# Patient Record
Sex: Male | Born: 1950 | Race: Black or African American | Hispanic: No | Marital: Single | Smoking: Current every day smoker
Health system: Southern US, Community
[De-identification: ages and names within clinical notes are randomized; demographics above are authoritative.]

## PROBLEM LIST (undated history)

## (undated) DIAGNOSIS — M1990 Unspecified osteoarthritis, unspecified site: Secondary | ICD-10-CM

## (undated) DIAGNOSIS — R079 Chest pain, unspecified: Secondary | ICD-10-CM

## (undated) HISTORY — PX: JOINT REPLACEMENT: SHX530

---

## 2011-02-15 HISTORY — PX: ROTATOR CUFF REPAIR: SHX139

## 2014-07-23 DIAGNOSIS — F17200 Nicotine dependence, unspecified, uncomplicated: Secondary | ICD-10-CM | POA: Insufficient documentation

## 2014-07-23 DIAGNOSIS — E785 Hyperlipidemia, unspecified: Secondary | ICD-10-CM | POA: Insufficient documentation

## 2014-07-23 DIAGNOSIS — E119 Type 2 diabetes mellitus without complications: Secondary | ICD-10-CM

## 2014-07-23 HISTORY — DX: Type 2 diabetes mellitus without complications: E11.9

## 2014-09-24 ENCOUNTER — Other Ambulatory Visit: Payer: Self-pay | Admitting: Orthopedic Surgery

## 2014-09-24 DIAGNOSIS — M5441 Lumbago with sciatica, right side: Secondary | ICD-10-CM

## 2014-10-01 ENCOUNTER — Ambulatory Visit
Admission: RE | Admit: 2014-10-01 | Discharge: 2014-10-01 | Disposition: A | Discharge status: Home / Self Care | Payer: Medicare HMO | Source: Ambulatory Visit | Attending: Orthopedic Surgery | Admitting: Orthopedic Surgery

## 2014-10-01 DIAGNOSIS — M545 Low back pain: Secondary | ICD-10-CM | POA: Insufficient documentation

## 2014-10-01 DIAGNOSIS — M5126 Other intervertebral disc displacement, lumbar region: Secondary | ICD-10-CM | POA: Insufficient documentation

## 2014-10-01 DIAGNOSIS — M5441 Lumbago with sciatica, right side: Secondary | ICD-10-CM

## 2014-10-01 DIAGNOSIS — M5136 Other intervertebral disc degeneration, lumbar region: Secondary | ICD-10-CM | POA: Insufficient documentation

## 2015-04-27 DIAGNOSIS — R079 Chest pain, unspecified: Secondary | ICD-10-CM | POA: Insufficient documentation

## 2016-03-13 IMAGING — MR MR LUMBAR SPINE W/O CM
5 series · 40 of 48 positions shown · non-contrast
Comparison: None.

CLINICAL DATA: Midline low back pain with pain radiating to the
right leg. Worse with activity. Spondylosis without myelopathy.

EXAM:
MRI LUMBAR SPINE WITHOUT CONTRAST
TECHNIQUE: Multiplanar, multisequence MR imaging of the lumbar spine was
performed. No intravenous contrast was administered.

[Series 2: T2 · sagittal · 4.0mm · 0.81mm/px · 6 of 17 slices shown (1 of 2)]
[im 1/17]
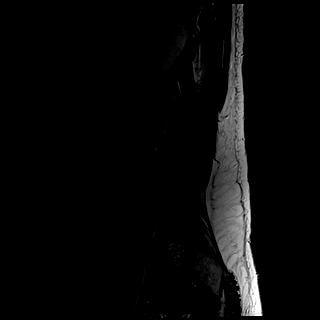
[im 4/17]
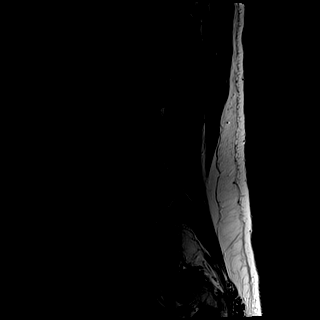
[im 7/17]
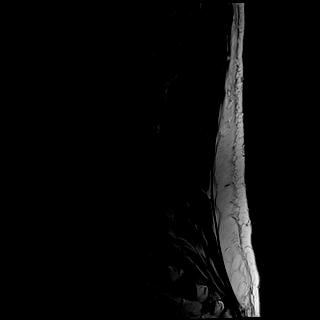
[im 10/17]
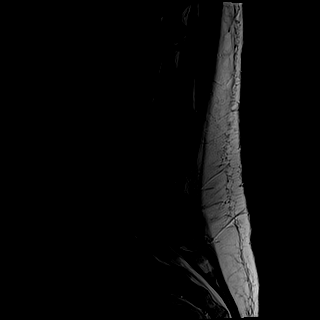
[im 13/17]
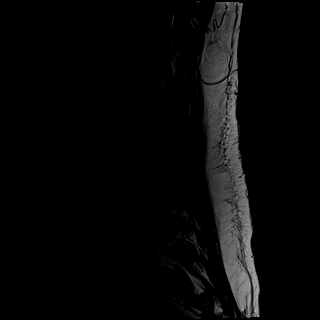
[im 17/17]
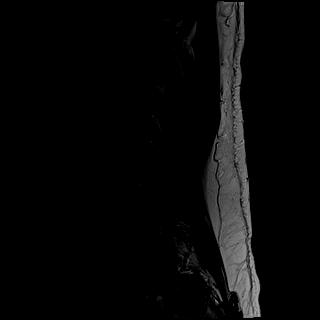

[Series 3: T1 · sagittal · 4.0mm · 0.81mm/px · 7 of 17 slices shown (1 of 2)]
[im 1/17]
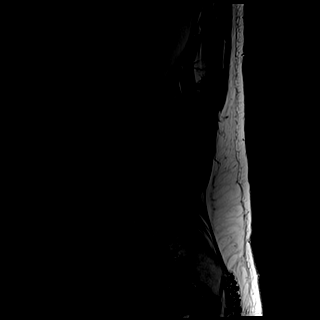
[im 3/17]
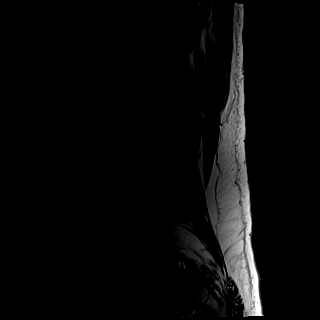
[im 6/17]
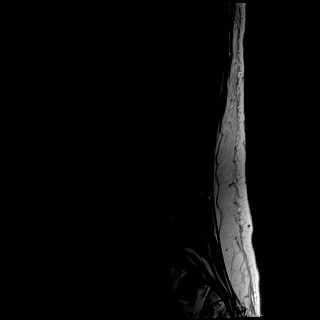
[im 9/17]
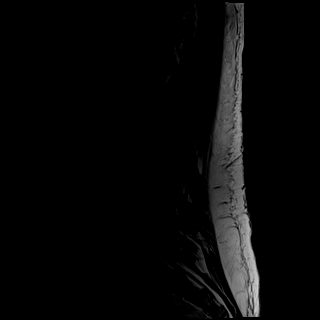
[im 11/17]
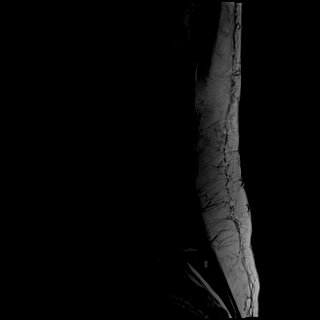
[im 14/17]
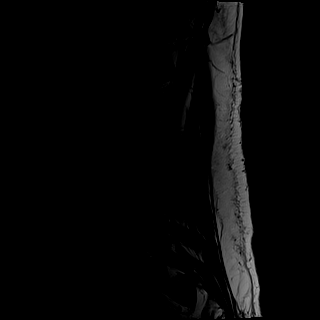
[im 17/17]
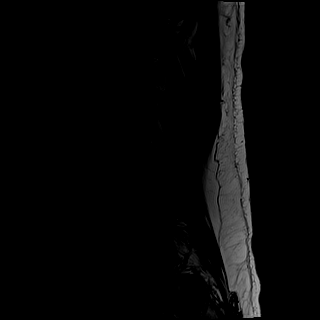

[Series 4: STIR · sagittal · 4.0mm · 1.02mm/px · 7 of 17 slices shown]
[im 1/17]
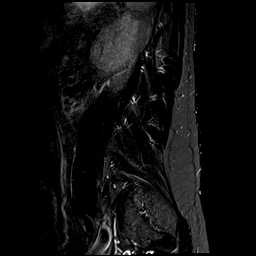
[im 3/17]
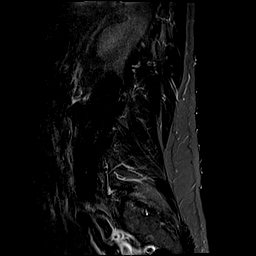
[im 6/17]
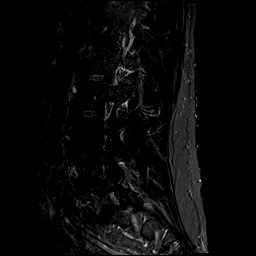
[im 9/17]
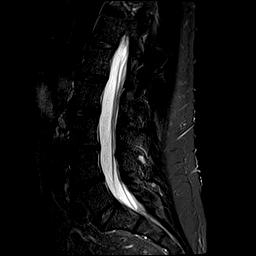
[im 11/17]
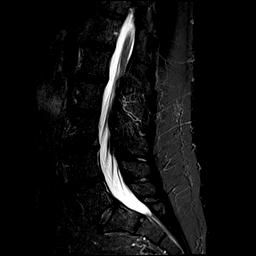
[im 14/17]
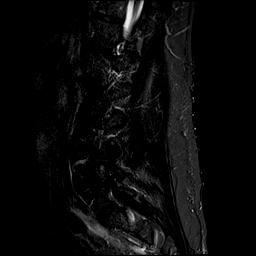
[im 17/17]
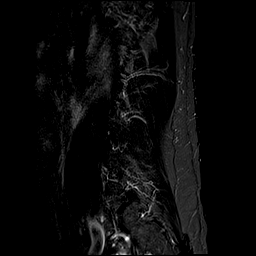

[Series 5: T2 · oblique · 4.0mm · 0.78mm/px · 12 of 35 slices shown (2 of 2)]
[im 1/35]
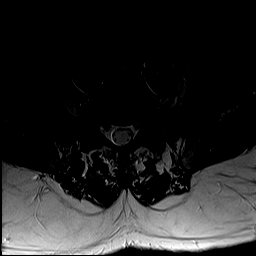
[im 3/35]
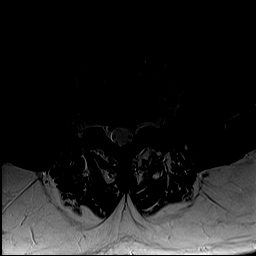
[im 6/35]
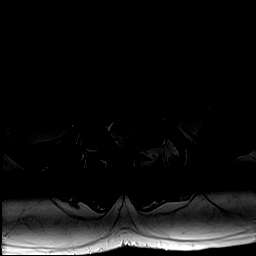
[im 8/35]
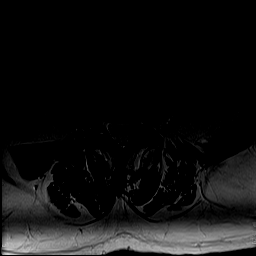
[im 11/35]
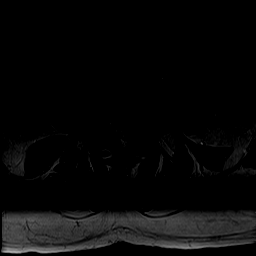
[im 14/35]
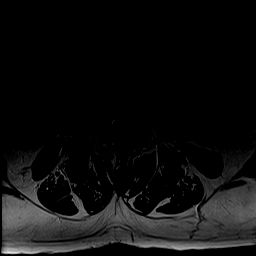
[im 16/35]
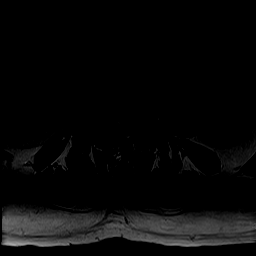
[im 19/35]
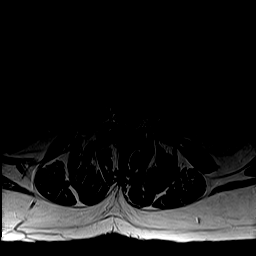
[im 21/35]
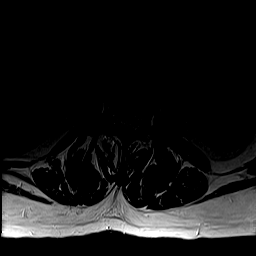
[im 24/35]
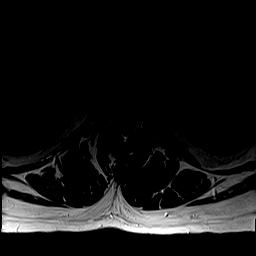
[im 29/35]
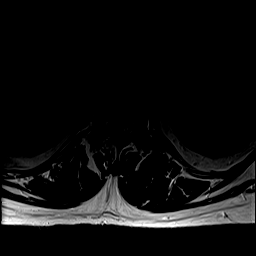
[im 35/35]
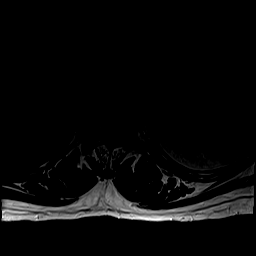

[Series 6: T1 · oblique · 4.0mm · 0.39mm/px · 8 of 35 slices shown (2 of 2)]
[im 1/35]
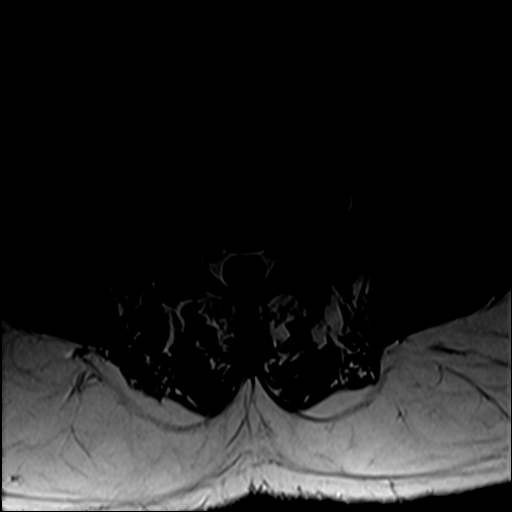
[im 6/35]
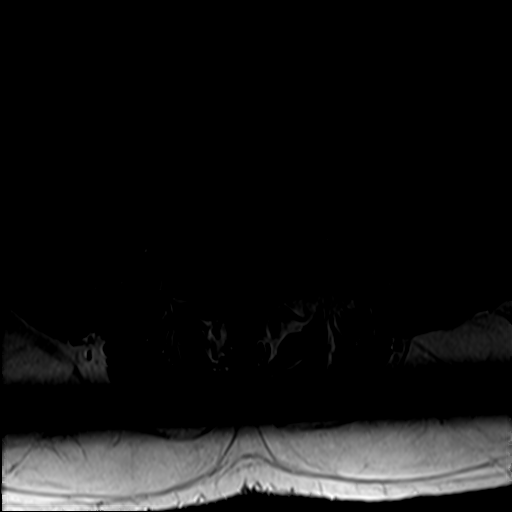
[im 11/35]
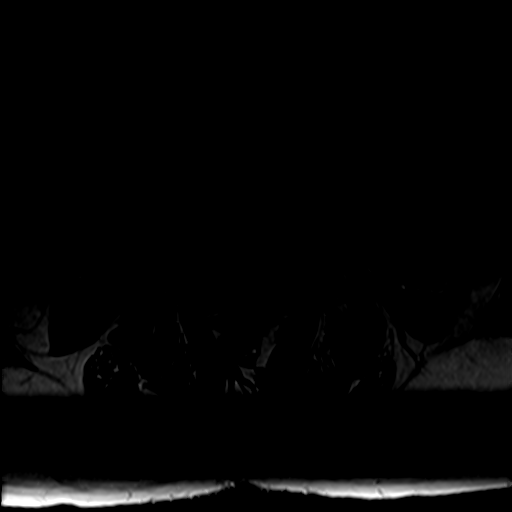
[im 16/35]
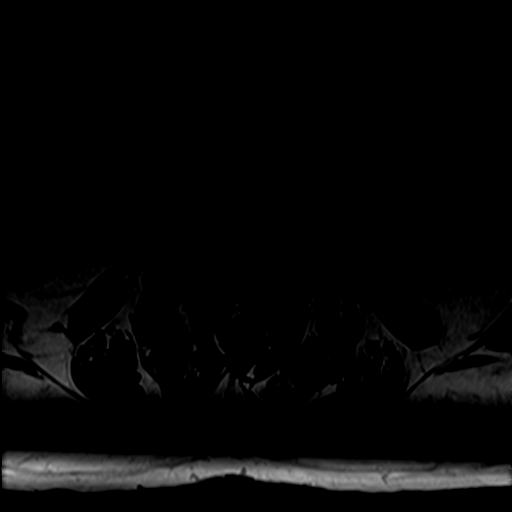
[im 19/35]
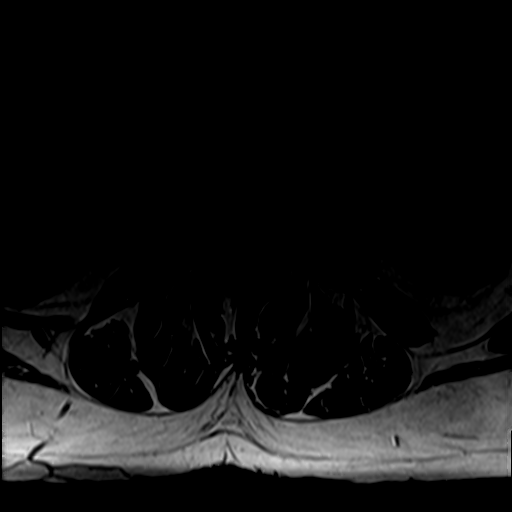
[im 24/35]
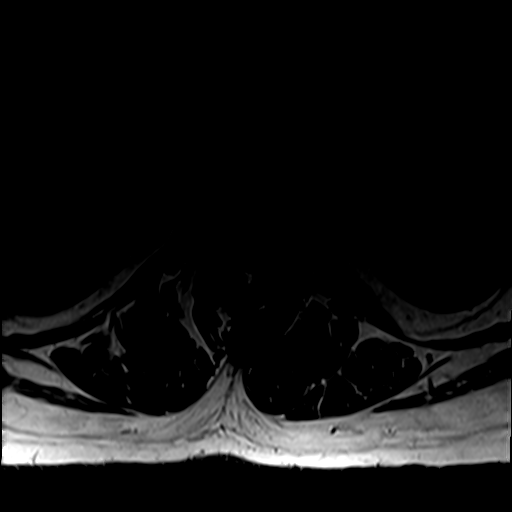
[im 29/35]
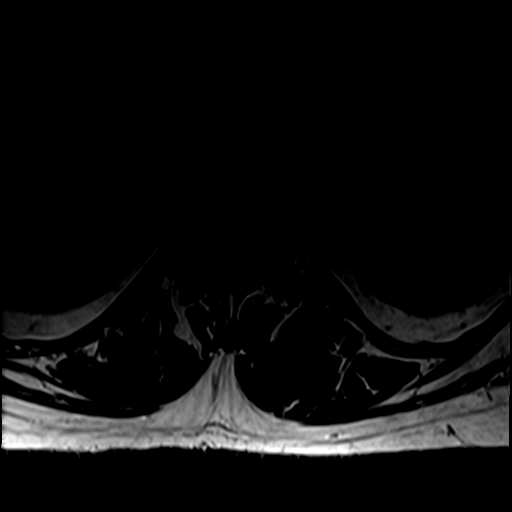
[im 35/35]
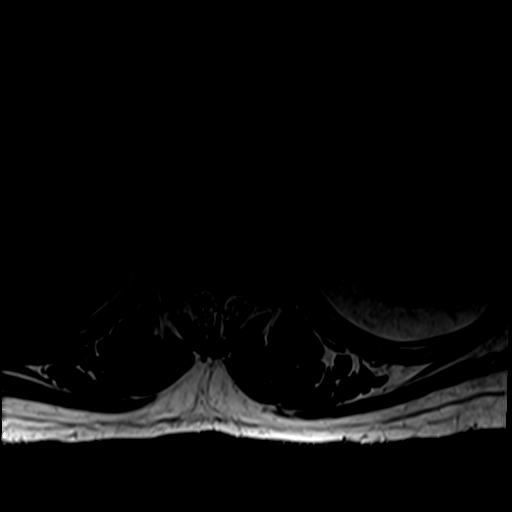

[40 of 48 positions shown; findings below may reference images not displayed]

FINDINGS: There is curvature convex to the left with the apex at L2-3. There
are no significant degenerative changes at L2-3 or above. The canal
and foramina are widely patent. The distal cord and conus are normal
with the conus tip at lower L1.

L3-4: Desiccation and bulging of the disc. No compressive stenosis
of the canal or foramina.

L4-5:  Mild bulging of the disc.  No stenosis or neural compression.

L5-S1: No disc pathology. Mild facet degeneration on the left with
some edema. This could be painful. No neural compression however.
IMPRESSION: Curvature convex to the left with the apex at L2-3.

Non-compressive disc bulges at L3-4 and L4-5.

Facet joint degeneration on the left at L5-S1 with edema could be a
cause of low back pain or facet syndrome pain.

## 2016-09-12 ENCOUNTER — Encounter
Admission: RE | Disposition: A | Discharge status: Home / Self Care | Payer: Self-pay | Source: Ambulatory Visit | Attending: Unknown Physician Specialty

## 2016-09-12 ENCOUNTER — Encounter: Payer: Self-pay | Admitting: Certified Registered Nurse Anesthetist

## 2016-09-12 ENCOUNTER — Ambulatory Visit: Payer: Medicare HMO | Admitting: Certified Registered Nurse Anesthetist

## 2016-09-12 ENCOUNTER — Ambulatory Visit
Admission: RE | Admit: 2016-09-12 | Discharge: 2016-09-12 | Disposition: A | Discharge status: Home / Self Care | Payer: Medicare HMO | Source: Ambulatory Visit | Attending: Unknown Physician Specialty | Admitting: Unknown Physician Specialty

## 2016-09-12 DIAGNOSIS — Z96652 Presence of left artificial knee joint: Secondary | ICD-10-CM | POA: Insufficient documentation

## 2016-09-12 DIAGNOSIS — F17200 Nicotine dependence, unspecified, uncomplicated: Secondary | ICD-10-CM | POA: Insufficient documentation

## 2016-09-12 DIAGNOSIS — D123 Benign neoplasm of transverse colon: Secondary | ICD-10-CM | POA: Diagnosis not present

## 2016-09-12 DIAGNOSIS — K5730 Diverticulosis of large intestine without perforation or abscess without bleeding: Secondary | ICD-10-CM | POA: Diagnosis not present

## 2016-09-12 DIAGNOSIS — Z79899 Other long term (current) drug therapy: Secondary | ICD-10-CM | POA: Diagnosis not present

## 2016-09-12 DIAGNOSIS — D124 Benign neoplasm of descending colon: Secondary | ICD-10-CM | POA: Insufficient documentation

## 2016-09-12 DIAGNOSIS — Z86010 Personal history of colonic polyps: Secondary | ICD-10-CM | POA: Diagnosis present

## 2016-09-12 HISTORY — PX: COLONOSCOPY WITH PROPOFOL: SHX5780

## 2016-09-12 HISTORY — DX: Chest pain, unspecified: R07.9

## 2016-09-12 SURGERY — COLONOSCOPY WITH PROPOFOL
Anesthesia: General

## 2016-09-12 MED ORDER — PIPERACILLIN-TAZOBACTAM 3.375 G IVPB
INTRAVENOUS | Status: AC
  Filled 2016-09-12: qty 50

## 2016-09-12 MED ORDER — PROPOFOL 10 MG/ML IV BOLUS
INTRAVENOUS | Status: DC | PRN
  Administered 2016-09-12 (×2): 30 mg via INTRAVENOUS

## 2016-09-12 MED ORDER — SODIUM CHLORIDE 0.9 % IV SOLN
INTRAVENOUS | Status: DC
  Administered 2016-09-12: 10:00:00 via INTRAVENOUS

## 2016-09-12 MED ORDER — PIPERACILLIN-TAZOBACTAM 3.375 G IVPB 30 MIN
3.3750 g | Freq: Once | INTRAVENOUS | Status: AC
  Administered 2016-09-12: 3.375 g via INTRAVENOUS
  Filled 2016-09-12: qty 50

## 2016-09-12 MED ORDER — PROPOFOL 500 MG/50ML IV EMUL
INTRAVENOUS | Status: AC
  Filled 2016-09-12: qty 50

## 2016-09-12 MED ORDER — SODIUM CHLORIDE 0.9 % IV SOLN: INTRAVENOUS | Status: DC

## 2016-09-12 MED ORDER — PROPOFOL 500 MG/50ML IV EMUL
INTRAVENOUS | Status: DC | PRN
  Administered 2016-09-12: 150 ug/kg/min via INTRAVENOUS

## 2016-09-12 MED ORDER — LIDOCAINE HCL (CARDIAC) 20 MG/ML IV SOLN
INTRAVENOUS | Status: DC | PRN
  Administered 2016-09-12: 30 mg via INTRAVENOUS

## 2016-09-12 MED ORDER — PIPERACILLIN-TAZOBACTAM 3.375 G IVPB
INTRAVENOUS | Status: AC
  Administered 2016-09-12: 3.375 g via INTRAVENOUS
  Filled 2016-09-12: qty 50

## 2016-09-12 NOTE — Anesthesia Postprocedure Evaluation (Signed)
Anesthesia Post Note  Patient: Joshua Mora  Procedure(s) Performed: Procedure(s) (LRB): COLONOSCOPY WITH PROPOFOL (N/A)  Patient location during evaluation: Endoscopy Anesthesia Type: General Level of consciousness: awake and alert and oriented Pain management: pain level controlled Vital Signs Assessment: post-procedure vital signs reviewed and stable Respiratory status: spontaneous breathing, nonlabored ventilation and respiratory function stable Cardiovascular status: blood pressure returned to baseline and stable Postop Assessment: no signs of nausea or vomiting Anesthetic complications: no     Last Vitals:  Vitals:   09/12/16 1049 09/12/16 1059  BP: 100/67 (!) 88/74  Pulse: 66 72  Resp: 19 19  Temp:      Last Pain:  Vitals:   09/12/16 1040  TempSrc: Tympanic                 Basir Niven

## 2016-09-12 NOTE — Anesthesia Procedure Notes (Signed)
Date/Time: 09/12/2016 10:03 AM Performed by: Johnna Acosta Pre-anesthesia Checklist: Patient identified, Emergency Drugs available, Suction available, Patient being monitored and Timeout performed Patient Re-evaluated:Patient Re-evaluated prior to induction Oxygen Delivery Method: Nasal cannula

## 2016-09-12 NOTE — Op Note (Signed)
[facility] Gastroenterology Patient Name: Joshua Mora Procedure Date: 09/12/2016 10:03 AM MRN: [id number] Account #: [id number] Date of Birth: April 17, 1950 Admit Type: Outpatient Age: 66 Room: [facility] ENDO ROOM 3 Gender: Male Note Status: Finalized Procedure:            Colonoscopy Indications:          High risk colon cancer surveillance: Personal history                        of colonic polyps Providers:            Manya Silvas, MD Referring MD:         Neomia Dear (Referring MD) Medicines:            Propofol per Anesthesia Procedure:            Pre-Anesthesia Assessment:                       - After reviewing the risks and benefits, the patient                        was deemed in satisfactory condition to undergo the                        procedure.                       After obtaining informed consent, the colonoscope was                        passed under direct vision. Throughout the procedure,                        the patient's blood pressure, pulse, and oxygen                        saturations were monitored continuously. The                        Colonoscope was introduced through the anus and                        advanced to the the cecum, identified by appendiceal                        orifice and ileocecal valve. The colonoscopy was                        performed without difficulty. The patient tolerated the                        procedure well. The quality of the bowel preparation                        was good. Findings:      Three sessile polyps were found in the descending colon and splenic       flexure. The polyps were diminutive in size. These polyps were removed       with a jumbo cold forceps. Resection and retrieval were complete.      Many medium-mouthed diverticula were found in the sigmoid colon and  descending colon.      The exam was otherwise without abnormality.      Internal hemorrhoids were found  during endoscopy. The hemorrhoids were       small and Grade I (internal hemorrhoids that do not prolapse).      The exam was otherwise without abnormality. Impression:           - Three diminutive polyps in the descending colon and                        at the splenic flexure, removed with a jumbo cold                        forceps. Resected and retrieved.                       - Diverticulosis in the sigmoid colon and in the                        descending colon.                       - The examination was otherwise normal. Recommendation:       - Await pathology results. Manya Silvas, MD 09/12/2016 10:38:00 AM This report has been signed electronically. Number of Addenda: 0 Note Initiated On: 09/12/2016 10:03 AM Scope Withdrawal Time: 0 hours 13 minutes 20 seconds  Total Procedure Duration: 0 hours 19 minutes 35 seconds       [facility]

## 2016-09-12 NOTE — Anesthesia Preprocedure Evaluation (Signed)
Anesthesia Evaluation  Patient identified by MRN, date of birth, ID band Patient awake    Reviewed: Allergy & Precautions, NPO status , Patient's Chart, lab work & pertinent test results  History of Anesthesia Complications Negative for: history of anesthetic complications  Airway Mallampati: II  TM Distance: >3 FB Neck ROM: Full    Dental  (+) Lower Dentures, Upper Dentures   Pulmonary neg sleep apnea, neg COPD, Current Smoker,    breath sounds clear to auscultation- rhonchi (-) wheezing      Cardiovascular Exercise Tolerance: Good (-) hypertension(-) CAD and (-) Past MI  Rhythm:Regular Rate:Normal - Systolic murmurs and - Diastolic murmurs    Neuro/Psych negative neurological ROS  negative psych ROS   GI/Hepatic negative GI ROS, Neg liver ROS,   Endo/Other  negative endocrine ROSneg diabetes  Renal/GU negative Renal ROS     Musculoskeletal negative musculoskeletal ROS (+)   Abdominal (+) - obese,   Peds  Hematology negative hematology ROS (+)   Anesthesia Other Findings    Reproductive/Obstetrics                             Anesthesia Physical Anesthesia Plan  ASA: II  Anesthesia Plan: General   Post-op Pain Management:    Induction: Intravenous  PONV Risk Score and Plan: 0 and Propofol infusion  Airway Management Planned: Natural Airway  Additional Equipment:   Intra-op Plan:   Post-operative Plan:   Informed Consent: I have reviewed the patients History and Physical, chart, labs and discussed the procedure including the risks, benefits and alternatives for the proposed anesthesia with the patient or authorized representative who has indicated his/her understanding and acceptance.   Dental advisory given  Plan Discussed with: CRNA and Anesthesiologist  Anesthesia Plan Comments:         Anesthesia Quick Evaluation

## 2016-09-12 NOTE — Anesthesia Post-op Follow-up Note (Cosign Needed)
Anesthesia QCDR form completed.        

## 2016-09-12 NOTE — Transfer of Care (Signed)
Immediate Anesthesia Transfer of Care Note  Patient: Joshua Mora  Procedure(s) Performed: Procedure(s): COLONOSCOPY WITH PROPOFOL (N/A)  Patient Location: PACU  Anesthesia Type:General  Level of Consciousness: sedated  Airway & Oxygen Therapy: Patient Spontanous Breathing and Patient connected to nasal cannula oxygen  Post-op Assessment: Report given to RN and Post -op Vital signs reviewed and stable  Post vital signs: Reviewed and stable  Last Vitals:  Vitals:   09/12/16 0940  BP: 124/65  Pulse: 87  Resp: 16  Temp: 36.5 C    Last Pain:  Vitals:   09/12/16 0940  TempSrc: Tympanic         Complications: No apparent anesthesia complications

## 2016-09-12 NOTE — H&P (Signed)
   Primary Care Physician:  Freddy Finner, NP Primary Gastroenterologist:  Dr. Vira Agar  Pre-Procedure History & Physical: HPI:  Joshua Mora is a 66 y.o. male is here for an colonoscopy.   Past Medical History:  Diagnosis Date  . Chest pain     Past Surgical History:  Procedure Laterality Date  . JOINT REPLACEMENT     LT TKA 1995  . ROTATOR CUFF REPAIR Left 2013    Prior to Admission medications   Medication Sig Start Date End Date Taking? Authorizing Provider  cyclobenzaprine (FLEXERIL) 10 MG tablet Take 10 mg by mouth 3 (three) times daily as needed for muscle spasms.   Yes [provider]  etodolac (LODINE) 500 MG tablet Take 500 mg by mouth 2 (two) times daily.   Yes [provider]  sildenafil (VIAGRA) 100 MG tablet Take 100 mg by mouth daily as needed for erectile dysfunction.   Yes [provider]    Allergies as of 06/10/2016  . (Not on File)    History reviewed. No pertinent family history.  Social History   Social History  . Marital status: Single    Spouse name: N/A  . Number of children: N/A  . Years of education: N/A   Occupational History  . Not on file.   Social History Main Topics  . Smoking status: Current Every Day Smoker  . Smokeless tobacco: Never Used  . Alcohol use Yes  . Drug use: No  . Sexual activity: Not on file   Other Topics Concern  . Not on file   Social History Narrative  . No narrative on file    Review of Systems: See HPI, otherwise negative ROS  Physical Exam: BP 124/65   Pulse 87   Temp 97.7 F (36.5 C) (Tympanic)   Resp 16   Ht 5\' 9"  (1.753 m)   Wt 88 kg (194 lb)   SpO2 100%   BMI 28.65 kg/m  General:   Alert,  pleasant and cooperative in NAD Head:  Normocephalic and atraumatic. Neck:  Supple; no masses or thyromegaly. Lungs:  Clear throughout to auscultation.    Heart:  Regular rate and rhythm. Abdomen:  Soft, nontender and nondistended. Normal bowel sounds, without guarding,  and without rebound.   Neurologic:  Alert and  oriented x4;  grossly normal neurologically.  Impression/Plan: Joshua Mora is here for an colonoscopy to be performed for [facility] colon polyps.  Risks, benefits, limitations, and alternatives regarding  colonoscopy have been reviewed with the patient.  Questions have been answered.  All parties agreeable.   Gaylyn Cheers, MD  09/12/2016, 10:01 AM

## 2016-09-13 ENCOUNTER — Encounter: Payer: Self-pay | Admitting: Unknown Physician Specialty

## 2016-09-14 LAB — SURGICAL PATHOLOGY

## 2017-10-13 ENCOUNTER — Ambulatory Visit: Payer: Self-pay | Admitting: Orthopedic Surgery

## 2017-11-09 ENCOUNTER — Other Ambulatory Visit: Payer: Medicare HMO

## 2017-11-09 ENCOUNTER — Encounter
Admission: RE | Admit: 2017-11-09 | Discharge: 2017-11-09 | Disposition: A | Discharge status: Home / Self Care | Payer: Medicare HMO | Source: Ambulatory Visit | Attending: Orthopedic Surgery | Admitting: Orthopedic Surgery

## 2017-11-09 ENCOUNTER — Other Ambulatory Visit: Payer: Self-pay

## 2017-11-09 DIAGNOSIS — Z01818 Encounter for other preprocedural examination: Secondary | ICD-10-CM | POA: Insufficient documentation

## 2017-11-09 HISTORY — DX: Unspecified osteoarthritis, unspecified site: M19.90

## 2017-11-09 NOTE — Patient Instructions (Signed)
Your procedure is scheduled on: Wednesday 11/15/17 Report to [facility]. To find out your arrival time please call [phone] between 1PM - 3PM on Tuesday 11/14/17.  Remember: Instructions that are not followed completely may result in serious medical risk, up to and including death, or upon the discretion of your surgeon and anesthesiologist your surgery may need to be rescheduled.     _X__ 1. Do not eat food after midnight the night before your procedure.                 No gum chewing or hard candies. You may drink clear liquids up to 2 hours                 before you are scheduled to arrive for your surgery- DO not drink clear                 liquids within 2 hours of the start of your surgery.                 Clear Liquids include:  water, apple juice without pulp, clear carbohydrate                 drink such as Clearfast or Gatorade, Black Coffee or Tea (Do not add                 anything to coffee or tea).  __X__2.  On the morning of surgery brush your teeth with toothpaste and water, you                 may rinse your mouth with mouthwash if you wish.  Do not swallow any              toothpaste of mouthwash.     _X__ 3.  No Alcohol for 24 hours before or after surgery.   _X__ 4.  Do Not Smoke or use e-cigarettes For 24 Hours Prior to Your Surgery.                 Do not use any chewable tobacco products for at least 6 hours prior to                 surgery.  ____  5.  Bring all medications with you on the day of surgery if instructed.   __X__  6.  Notify your doctor if there is any change in your medical condition      (cold, fever, infections).     Do not wear jewelry, make-up, hairpins, clips or nail polish. Do not wear lotions, powders, or perfumes.  Do not shave 48 hours prior to surgery. Men may shave face and neck. Do not bring valuables to the hospital.    [facility] is not responsible for any belongings or  valuables.  Contacts, dentures/partials or body piercings may not be worn into surgery. Bring a case for your contacts, glasses or hearing aids, a denture cup will be supplied. Leave your suitcase in the car. After surgery it may be brought to your room. For patients admitted to the hospital, discharge time is determined by your treatment team.   Patients discharged the day of surgery will not be allowed to drive home.   Please read over the following fact sheets that you were given:   MRSA Information  __X__ Take these medicines the morning of surgery with A SIP OF WATER:  1. none  2.   3.   4.  5.  6.  ____ Fleet Enema (as directed)   __X__ Use CHG Soap/SAGE wipes as directed  ____ Use inhalers on the day of surgery  ____ Stop metformin/Janumet/Farxiga 2 days prior to surgery    ____ Take 1/2 of usual insulin dose the night before surgery. No insulin the morning          of surgery.   ____ Stop Blood Thinners Coumadin/Plavix/Xarelto/Pleta/Pradaxa/Eliquis/Effient/Aspirin  on   Or contact your Surgeon, Cardiologist or Medical Doctor regarding  ability to stop your blood thinners  __X__ Stop Anti-inflammatories 7 days before surgery such as Advil, Ibuprofen, Motrin,  BC or Goodies Powder, Naprosyn, Naproxen, Aleve, Aspirin YOU MAKE TAKE TYLENOL OTC IF NEEDED   __X__ Stop all herbal supplements, fish oil or vitamin E until after surgery.    ____ Bring C-Pap to the hospital.

## 2017-11-14 MED ORDER — CEFAZOLIN SODIUM-DEXTROSE 2-4 GM/100ML-% IV SOLN
2.0000 g | INTRAVENOUS | Status: AC
  Administered 2017-11-15: 2 g via INTRAVENOUS

## 2017-11-15 ENCOUNTER — Encounter: Payer: Self-pay | Admitting: Emergency Medicine

## 2017-11-15 ENCOUNTER — Encounter
Admission: RE | Disposition: A | Discharge status: Home / Self Care | Payer: Self-pay | Source: Ambulatory Visit | Attending: Orthopedic Surgery

## 2017-11-15 ENCOUNTER — Other Ambulatory Visit: Payer: Self-pay

## 2017-11-15 ENCOUNTER — Ambulatory Visit: Payer: Medicare HMO | Admitting: Anesthesiology

## 2017-11-15 ENCOUNTER — Ambulatory Visit
Admission: RE | Admit: 2017-11-15 | Discharge: 2017-11-15 | Disposition: A | Discharge status: Home / Self Care | Payer: Medicare HMO | Source: Ambulatory Visit | Attending: Orthopedic Surgery | Admitting: Orthopedic Surgery

## 2017-11-15 DIAGNOSIS — M7521 Bicipital tendinitis, right shoulder: Secondary | ICD-10-CM | POA: Insufficient documentation

## 2017-11-15 DIAGNOSIS — M75121 Complete rotator cuff tear or rupture of right shoulder, not specified as traumatic: Secondary | ICD-10-CM | POA: Diagnosis not present

## 2017-11-15 DIAGNOSIS — M19011 Primary osteoarthritis, right shoulder: Secondary | ICD-10-CM | POA: Insufficient documentation

## 2017-11-15 DIAGNOSIS — F17210 Nicotine dependence, cigarettes, uncomplicated: Secondary | ICD-10-CM | POA: Diagnosis not present

## 2017-11-15 HISTORY — PX: SHOULDER ARTHROSCOPY WITH ROTATOR CUFF REPAIR: SHX5685

## 2017-11-15 SURGERY — ARTHROSCOPY, SHOULDER, WITH ROTATOR CUFF REPAIR
Anesthesia: General | Site: Shoulder | Laterality: Right

## 2017-11-15 MED ORDER — EPHEDRINE SULFATE 50 MG/ML IJ SOLN
INTRAMUSCULAR | Status: AC
  Filled 2017-11-15: qty 1

## 2017-11-15 MED ORDER — DEXAMETHASONE SODIUM PHOSPHATE 10 MG/ML IJ SOLN
INTRAMUSCULAR | Status: DC | PRN
  Administered 2017-11-15: 6 mg via INTRAVENOUS

## 2017-11-15 MED ORDER — PROPOFOL 10 MG/ML IV BOLUS
INTRAVENOUS | Status: AC
  Filled 2017-11-15: qty 20

## 2017-11-15 MED ORDER — MIDAZOLAM HCL 2 MG/2ML IJ SOLN: 1.0000 mg | Freq: Once | INTRAMUSCULAR | Status: DC

## 2017-11-15 MED ORDER — FENTANYL CITRATE (PF) 100 MCG/2ML IJ SOLN
50.0000 ug | Freq: Once | INTRAMUSCULAR | Status: AC
  Administered 2017-11-15 (×2): 50 ug via INTRAVENOUS

## 2017-11-15 MED ORDER — FENTANYL CITRATE (PF) 100 MCG/2ML IJ SOLN
INTRAMUSCULAR | Status: AC
  Filled 2017-11-15: qty 2

## 2017-11-15 MED ORDER — FAMOTIDINE 20 MG PO TABS
ORAL_TABLET | ORAL | Status: AC
  Administered 2017-11-15: 20 mg via ORAL
  Filled 2017-11-15: qty 1

## 2017-11-15 MED ORDER — BUPIVACAINE HCL (PF) 0.5 % IJ SOLN
INTRAMUSCULAR | Status: AC
  Filled 2017-11-15: qty 10

## 2017-11-15 MED ORDER — LACTATED RINGERS IV SOLN: INTRAVENOUS | Status: DC

## 2017-11-15 MED ORDER — LIDOCAINE HCL (PF) 1 % IJ SOLN
INTRAMUSCULAR | Status: AC
  Filled 2017-11-15: qty 5

## 2017-11-15 MED ORDER — ROCURONIUM BROMIDE 100 MG/10ML IV SOLN
INTRAVENOUS | Status: DC | PRN
  Administered 2017-11-15: 10 mg via INTRAVENOUS

## 2017-11-15 MED ORDER — MIDAZOLAM HCL 2 MG/2ML IJ SOLN
INTRAMUSCULAR | Status: AC
  Administered 2017-11-15: 1 mg
  Filled 2017-11-15: qty 2

## 2017-11-15 MED ORDER — ACETAMINOPHEN 500 MG PO TABS
1000.0000 mg | ORAL_TABLET | Freq: Once | ORAL | Status: AC
  Administered 2017-11-15: 1000 mg via ORAL

## 2017-11-15 MED ORDER — FENTANYL CITRATE (PF) 100 MCG/2ML IJ SOLN: 25.0000 ug | INTRAMUSCULAR | Status: DC | PRN

## 2017-11-15 MED ORDER — ONDANSETRON HCL 4 MG/2ML IJ SOLN
INTRAMUSCULAR | Status: DC | PRN
  Administered 2017-11-15: 4 mg via INTRAVENOUS

## 2017-11-15 MED ORDER — SUCCINYLCHOLINE CHLORIDE 20 MG/ML IJ SOLN
INTRAMUSCULAR | Status: DC | PRN
  Administered 2017-11-15: 100 mg via INTRAVENOUS

## 2017-11-15 MED ORDER — SUCCINYLCHOLINE CHLORIDE 20 MG/ML IJ SOLN
INTRAMUSCULAR | Status: AC
  Filled 2017-11-15: qty 1

## 2017-11-15 MED ORDER — ONDANSETRON HCL 4 MG/2ML IJ SOLN
INTRAMUSCULAR | Status: AC
  Filled 2017-11-15: qty 2

## 2017-11-15 MED ORDER — BUPIVACAINE LIPOSOME 1.3 % IJ SUSP
INTRAMUSCULAR | Status: AC
  Filled 2017-11-15: qty 20

## 2017-11-15 MED ORDER — ONDANSETRON HCL 4 MG/2ML IJ SOLN: 4.0000 mg | Freq: Once | INTRAMUSCULAR | Status: DC | PRN

## 2017-11-15 MED ORDER — FAMOTIDINE 20 MG PO TABS
20.0000 mg | ORAL_TABLET | Freq: Once | ORAL | Status: AC
  Administered 2017-11-15: 20 mg via ORAL

## 2017-11-15 MED ORDER — ACETAMINOPHEN 500 MG PO TABS
ORAL_TABLET | ORAL | Status: AC
  Administered 2017-11-15: 1000 mg via ORAL
  Filled 2017-11-15: qty 2

## 2017-11-15 MED ORDER — LIDOCAINE HCL (PF) 2 % IJ SOLN
INTRAMUSCULAR | Status: AC
  Filled 2017-11-15: qty 10

## 2017-11-15 MED ORDER — EPINEPHRINE 30 MG/30ML IJ SOLN
INTRAMUSCULAR | Status: DC | PRN
  Administered 2017-11-15: 4 mL

## 2017-11-15 MED ORDER — GABAPENTIN 300 MG PO CAPS
ORAL_CAPSULE | ORAL | Status: AC
  Administered 2017-11-15: 300 mg via ORAL
  Filled 2017-11-15: qty 1

## 2017-11-15 MED ORDER — DEXAMETHASONE SODIUM PHOSPHATE 10 MG/ML IJ SOLN
INTRAMUSCULAR | Status: AC
  Filled 2017-11-15: qty 1

## 2017-11-15 MED ORDER — GABAPENTIN 300 MG PO CAPS
300.0000 mg | ORAL_CAPSULE | Freq: Once | ORAL | Status: AC
  Administered 2017-11-15: 300 mg via ORAL

## 2017-11-15 MED ORDER — CEFAZOLIN SODIUM-DEXTROSE 2-4 GM/100ML-% IV SOLN
INTRAVENOUS | Status: AC
  Filled 2017-11-15: qty 100

## 2017-11-15 MED ORDER — FENTANYL CITRATE (PF) 100 MCG/2ML IJ SOLN
INTRAMUSCULAR | Status: AC
  Administered 2017-11-15: 50 ug
  Filled 2017-11-15: qty 2

## 2017-11-15 MED ORDER — OXYCODONE HCL 10 MG PO TABS: 10.0000 mg | ORAL_TABLET | ORAL | 0 refills | Status: DC | PRN

## 2017-11-15 MED ORDER — CHLORHEXIDINE GLUCONATE 4 % EX LIQD: 60.0000 mL | Freq: Once | CUTANEOUS | Status: DC

## 2017-11-15 MED ORDER — DOCUSATE SODIUM 100 MG PO CAPS: 100.0000 mg | ORAL_CAPSULE | Freq: Every day | ORAL | 2 refills | Status: AC | PRN

## 2017-11-15 MED ORDER — ROCURONIUM BROMIDE 50 MG/5ML IV SOLN
INTRAVENOUS | Status: AC
  Filled 2017-11-15: qty 1

## 2017-11-15 MED ORDER — LIDOCAINE HCL (CARDIAC) PF 100 MG/5ML IV SOSY
PREFILLED_SYRINGE | INTRAVENOUS | Status: DC | PRN
  Administered 2017-11-15: 80 mg via INTRAVENOUS

## 2017-11-15 MED ORDER — PHENYLEPHRINE HCL 10 MG/ML IJ SOLN
INTRAMUSCULAR | Status: DC | PRN
  Administered 2017-11-15 (×4): 100 ug via INTRAVENOUS

## 2017-11-15 SURGICAL SUPPLY — 71 items
ADAPTER IRRIG TUBE 2 SPIKE SOL (ADAPTER) ×6 IMPLANT
ANCHOR SUT CROSSFT KNTLS 4.75 (Anchor) ×4 IMPLANT
BLADE FULL RADIUS 3.5 (BLADE) ×3 IMPLANT
BLADE INCISOR PLUS 4.5 (BLADE) ×3 IMPLANT
BLADE SURG MINI STRL (BLADE) ×3 IMPLANT
BRUSH SCRUB EZ  4% CHG (MISCELLANEOUS) ×2
BRUSH SCRUB EZ 4% CHG (MISCELLANEOUS) ×1 IMPLANT
BUR BR 5.5 WIDE MOUTH (BURR) ×2 IMPLANT
CANNULA SHOULDER 7CM (CANNULA) ×3 IMPLANT
CANNULA TWIST IN 8.25X7CM (CANNULA) ×3 IMPLANT
CHLORAPREP W/TINT 26ML (MISCELLANEOUS) ×5 IMPLANT
CLOSURE WOUND 1/4X4 (GAUZE/BANDAGES/DRESSINGS)
COOLER POLAR GLACIER W/PUMP (MISCELLANEOUS) ×3 IMPLANT
CRADLE LAMINECT ARM (MISCELLANEOUS) ×3 IMPLANT
DEVICE SUCT BLK HOLE OR FLOOR (MISCELLANEOUS) ×1 IMPLANT
DRAPE IMP U-DRAPE 54X76 (DRAPES) ×6 IMPLANT
DRAPE INCISE IOBAN 66X45 STRL (DRAPES) ×3 IMPLANT
DRAPE SHEET LG 3/4 BI-LAMINATE (DRAPES) ×3 IMPLANT
DRAPE STERI 35X30 U-POUCH (DRAPES) ×3 IMPLANT
DRAPE U-SHAPE 47X51 STRL (DRAPES) ×3 IMPLANT
ELECT REM PT RETURN 9FT ADLT (ELECTROSURGICAL) ×3
ELECTRODE REM PT RTRN 9FT ADLT (ELECTROSURGICAL) ×1 IMPLANT
GAUZE PETRO XEROFOAM 1X8 (MISCELLANEOUS) ×3 IMPLANT
GAUZE SPONGE 4X4 12PLY STRL (GAUZE/BANDAGES/DRESSINGS) ×3 IMPLANT
GLOVE BIOGEL PI IND STRL 7.5 (GLOVE) IMPLANT
GLOVE BIOGEL PI IND STRL 8 (GLOVE) ×1 IMPLANT
GLOVE BIOGEL PI INDICATOR 7.5 (GLOVE) ×12
GLOVE BIOGEL PI INDICATOR 8 (GLOVE) ×2
GLOVE SURG ORTHO 8.0 STRL STRW (GLOVE) ×3 IMPLANT
GOWN STRL REUS W/ TWL LRG LVL3 (GOWN DISPOSABLE) ×1 IMPLANT
GOWN STRL REUS W/ TWL XL LVL3 (GOWN DISPOSABLE) ×1 IMPLANT
GOWN STRL REUS W/TWL LRG LVL3 (GOWN DISPOSABLE) ×10
GOWN STRL REUS W/TWL XL LVL3 (GOWN DISPOSABLE) ×2
IV LACTATED RINGER IRRG 3000ML (IV SOLUTION) ×8
IV LR IRRIG 3000ML ARTHROMATIC (IV SOLUTION) ×6 IMPLANT
KIT STABILIZATION SHOULDER (MISCELLANEOUS) ×3 IMPLANT
KIT TURNOVER KIT A (KITS) ×3 IMPLANT
MANIFOLD NEPTUNE II (INSTRUMENTS) ×4 IMPLANT
MASK FACE SPIDER DISP (MASK) ×3 IMPLANT
MAT ABSORB  FLUID 56X50 GRAY (MISCELLANEOUS) ×2
MAT ABSORB FLUID 56X50 GRAY (MISCELLANEOUS) ×1 IMPLANT
NDL SAFETY ECLIPSE 18X1.5 (NEEDLE) ×1 IMPLANT
NDL SCORPION MULTI FIRE (NEEDLE) IMPLANT
NDL SPNL 18GX3.5 QUINCKE PK (NEEDLE) ×1 IMPLANT
NEEDLE HYPO 18GX1.5 SHARP (NEEDLE) ×2
NEEDLE HYPO 22GX1.5 SAFETY (NEEDLE) ×3 IMPLANT
NEEDLE SCORPION MULTI FIRE (NEEDLE) ×3 IMPLANT
NEEDLE SPNL 18GX3.5 QUINCKE PK (NEEDLE) ×3 IMPLANT
PACK ARTHROSCOPY SHOULDER (MISCELLANEOUS) ×3 IMPLANT
PAD ABD DERMACEA PRESS 5X9 (GAUZE/BANDAGES/DRESSINGS) IMPLANT
PAD WRAPON POLAR SHDR XLG (MISCELLANEOUS) ×1 IMPLANT
SLING ARM LRG DEEP (SOFTGOODS) IMPLANT
SLING ULTRA II LG (MISCELLANEOUS) ×2 IMPLANT
SPONGE XRAY 4X4 16PLY STRL (MISCELLANEOUS) IMPLANT
STRAP SAFETY 5IN WIDE (MISCELLANEOUS) ×3 IMPLANT
STRIP CLOSURE SKIN 1/4X4 (GAUZE/BANDAGES/DRESSINGS) IMPLANT
SUT ETHILON NAB PS2 4-0 18IN (SUTURE) ×3 IMPLANT
SUT FIBERWIRE #2 38 T-5 BLUE (SUTURE)
SUT PDS AB 0 CT1 27 (SUTURE) ×1 IMPLANT
SUT TIGER TAPE 7 IN WHITE (SUTURE) IMPLANT
SUTURE FIBERWR #2 38 T-5 BLUE (SUTURE) IMPLANT
SYR 10ML LL (SYRINGE) ×3 IMPLANT
SYR 50ML LL SCALE MARK (SYRINGE) ×3 IMPLANT
TAPE HI-FI 2MM BLUE (SUTURE) ×2 IMPLANT
TAPE HI-FI 2MM COBRAID WHT BLK (SUTURE) ×2 IMPLANT
TAPE MICROFOAM 4IN (TAPE) ×3 IMPLANT
TUBING ARTHRO INFLOW-ONLY STRL (TUBING) ×3 IMPLANT
TUBING CONNECTING 10 (TUBING) ×2 IMPLANT
TUBING CONNECTING 10' (TUBING) ×1
WAND HAND CNTRL MULTIVAC 90 (MISCELLANEOUS) ×3 IMPLANT
WRAPON POLAR PAD SHDR XLG (MISCELLANEOUS) ×3

## 2017-11-15 NOTE — Progress Notes (Signed)
Polar care, sling and getting dressed taught to sister and pt

## 2017-11-15 NOTE — Op Note (Signed)
11/15/2017  12:35 PM  PATIENT:  Joshua Mora  67 y.o. male  PRE-OPERATIVE DIAGNOSIS:  FULL THICKNESS ROTATOR CUFF TEAR, RIGHT SHOULDER  POST-OPERATIVE DIAGNOSIS:  FULL THICKNESS ROTATOR CUFF TEAR, AC arthritis, impingement and biceps tendonitis right shoulder  PROCEDURE:  Procedure(s): SHOULDER ARTHROSCOPY WITH ROTATOR CUFF REPAIR (Right)  Distal clavicle excision Subacromial decompression Biceps tenotomy and limited debridement of the glenoid labrum  SURGEON:  Surgeon(s) and Role:    Lovell Sheehan, MD - Primary  ASSIST: Carlynn Spry, PA-C  ANESTHESIA:   regional and general   PREOPERATIVE INDICATIONS:  Joshua Mora is a  67 y.o. male with a diagnosis of [place] who failed conservative measures and elected for surgical management.    The risks benefits and alternatives were discussed with the patient preoperatively including but not limited to the risks of infection, bleeding, nerve injury, persistent pain or weakness, failure of the hardware, re-tear of the rotator cuff and the need for further surgery. Medical risks include DVT and pulmonary embolism, myocardial infarction, stroke, pneumonia, respiratory failure and death. Patient understood these risks and wished to proceed.  OPERATIVE IMPLANTS: [employer] SpeedBridge System  OPERATIVE PROCEDURE: The patient was met in the preoperative area. The right shoulder was signed with my initials according the hospital's correct site of surgery protocol. The patient is brought to the OR and underwent a supraclavicular block and general endotracheal intubation by the anesthesia service.  The patient was placed in a beachchair position. A spider arm positioner was used for this case. Examination under anesthesia revealed negative sulcus sign and no anterior/posterior instability.  The patient was prepped and draped in a sterile fashion. A timeout was performed to verify the patient's name, date of  birth, medical record number, correct site of surgery and correct procedure to be performed there was also used to verify the patient received antibiotics that all appropriate instruments, implants and radiographs studies were available in the room. Once all in attendance were in agreement case began.  Bony landmarks were drawn out with a surgical marker along with proposed arthroscopy incisions. These were pre-injected with 1% lidocaine plain. An 11 blade was used to establish a posterior portal through which the arthroscope was placed in the glenohumeral joint. A full diagnostic examination of the shoulder was performed. The anterior portal was established under direct visualization with an 18-gauge spinal needle.  A 5.75 mm arthroscopic cannula was placed through the anterior portal.   The intra-articular portion of the biceps tendon was found to have a partial tear involving greater than 50% of the diameter. Therefore the decision was made to perform a tenotomy. An arthroscopic wand was used to release the biceps tendon off the superior labrum. The arthroscopic shaver was then used to debride the frayed edges of the labrum. There were no anterior or superior labral tears seen.  Subscapularis tendon was intact. Patient had a full-thickness tear involving the supraspinatus with retraction. There were no loose bodies within the inferior recess and no evidence of HAGL lesion.  The arthroscope was then placed in the subacromial space. A lateral portal was then established using an 18-gauge spinal needle for localization.   The greater tuberosity was debrided using a 5.5 mm resector shaver blade to remove all remaining foreign fibers of the rotator cuff.  Debridement was performed until punctate bleeding was seen at the greater tuberosity footprint, which will allow for rotator cuff healing.  Extensive bursitis was encountered and debrided using a 4-0  resector shaver blade and a 90 ArthroCare wand from the  lateral portal. Using horizontal mattress stitch with fiber tape were placed. The cuff was mobilized and the tape passed through the rotator cuff. The tape was then crossed in usual fashion and fixated on the lateral side with two CrossFit anchors. The final construct was stable and moved as a unit with excellent coverage of the humeral head.  Using the arthroscopic burr, the anterior spurs were removed from the undersurface of the acromion. The distal clavicle was removed with the burr from the anterior portal.  All incisions were copiously irrigated. Skin closure for the arthroscopic incisions was performed with 4-0 nylon.  A dry sterile dressing including Steri-Strips was applied . The patient was placed in an abduction sling.  All sharp and instrument counts were correct at the conclusion of the case. I was scrubbed and present for the entire case. I spoke with the patient's family in the post-op consultation room and informed them that the case had been performed without complication and the patient was stable in recovery room.   Kurtis Bushman, MD

## 2017-11-15 NOTE — Anesthesia Preprocedure Evaluation (Addendum)
Anesthesia Evaluation  Patient identified by MRN, date of birth, ID band Patient awake    Reviewed: Allergy & Precautions, NPO status , Patient's Chart, lab work & pertinent test results  History of Anesthesia Complications Negative for: history of anesthetic complications  Airway Mallampati: II  TM Distance: >3 FB Neck ROM: Full    Dental  (+) Lower Dentures, Upper Dentures   Pulmonary neg sleep apnea, neg COPD, Current Smoker,    breath sounds clear to auscultation- rhonchi (-) wheezing      Cardiovascular Exercise Tolerance: Good (-) hypertension(-) CAD and (-) Past MI  Rhythm:Regular Rate:Normal - Systolic murmurs and - Diastolic murmurs    Neuro/Psych negative neurological ROS  negative psych ROS   GI/Hepatic negative GI ROS, Neg liver ROS,   Endo/Other  negative endocrine ROSneg diabetes  Renal/GU negative Renal ROS     Musculoskeletal negative musculoskeletal ROS (+)   Abdominal (+) - obese,   Peds  Hematology negative hematology ROS (+)   Anesthesia Other Findings    Reproductive/Obstetrics                             Anesthesia Physical  Anesthesia Plan  ASA: II  Anesthesia Plan: General   Post-op Pain Management:  Regional for Post-op pain   Induction: Intravenous  PONV Risk Score and Plan: 0 and Propofol infusion  Airway Management Planned: Oral ETT  Additional Equipment:   Intra-op Plan:   Post-operative Plan: Extubation in OR  Informed Consent: I have reviewed the patients History and Physical, chart, labs and discussed the procedure including the risks, benefits and alternatives for the proposed anesthesia with the patient or authorized representative who has indicated his/her understanding and acceptance.   Dental advisory given  Plan Discussed with: CRNA, Anesthesiologist and Surgeon  Anesthesia Plan Comments: (Patient agrees to interscalene block for  postop pain after describing risks that include pneumothorax, nerve damamge, infection, inadequate block, and difficulty breathing .  Patient understands risks and wishes to proceed with block.)       Anesthesia Quick Evaluation

## 2017-11-15 NOTE — Anesthesia Procedure Notes (Signed)
Procedure Name: Intubation Date/Time: 11/15/2017 10:59 AM Performed by: Philbert Riser, CRNA Pre-anesthesia Checklist: Patient identified, Emergency Drugs available, Suction available, Patient being monitored and Timeout performed Patient Re-evaluated:Patient Re-evaluated prior to induction Oxygen Delivery Method: Circle system utilized and Simple face mask Preoxygenation: Pre-oxygenation with 100% oxygen Induction Type: IV induction Ventilation: Mask ventilation without difficulty Laryngoscope Size: McGraph and 3 Grade View: Grade I Tube type: Oral Number of attempts: 1 Secured at: 22 cm Tube secured with: Tape Dental Injury: Teeth and Oropharynx as per pre-operative assessment

## 2017-11-15 NOTE — Discharge Instructions (Signed)
Wear sling at all times, including sleep.  You will need to use the sling for a total of 4 weeks following surgery.  Do not try and lift your arm up or away from your body for any reason.   Keep the dressing dry.  You may remove bandage in 3 days.  You may place Band-Aids over top of the incisions.  May shower once dressing is removed in 3 days.  Remove sling carefully only for showers, leaving arm down by your side while in the shower.  +++ Make sure to take some pain medication this evening before you fall asleep, in preparation for the nerve block wearing off in the middle of the night.  If the the pain medication causes itching, or is too strong, try taking a single tablet at a time, or combining with Benadryl.  You may be most comfortable sleeping in a recliner.  If you do sleep in near bed, placed pillows behind the shoulder that have the operation to support it.   AMBULATORY SURGERY  DISCHARGE INSTRUCTIONS   1) The drugs that you were given will stay in your system until tomorrow so for the next 24 hours you should not:  A) Drive an automobile B) Make any legal decisions C) Drink any alcoholic beverage   2) You may resume regular meals tomorrow.  Today it is better to start with liquids and gradually work up to solid foods.  You may eat anything you prefer, but it is better to start with liquids, then soup and crackers, and gradually work up to solid foods.   3) Please notify your doctor immediately if you have any unusual bleeding, trouble breathing, redness and pain at the surgery site, drainage, fever, or pain not relieved by medication.    4) Additional Instructions:        Please contact your physician with any problems or Same Day Surgery at [phone], Monday through Friday 6 am to 4 pm, or [place] at [facility] number at [phone].AMBULATORY SURGERY  DISCHARGE INSTRUCTIONS   5) The drugs that you were given will stay in your system until  tomorrow so for the next 24 hours you should not:  D) Drive an automobile E) Make any legal decisions F) Drink any alcoholic beverage   6) You may resume regular meals tomorrow.  Today it is better to start with liquids and gradually work up to solid foods.  You may eat anything you prefer, but it is better to start with liquids, then soup and crackers, and gradually work up to solid foods.   7) Please notify your doctor immediately if you have any unusual bleeding, trouble breathing, redness and pain at the surgery site, drainage, fever, or pain not relieved by medication.    8) Additional Instructions:        Please contact your physician with any problems or Same Day Surgery at [phone], Monday through Friday 6 am to 4 pm, or [place] at [facility] number at [phone].AMBULATORY SURGERY  DISCHARGE INSTRUCTIONS   9) The drugs that you were given will stay in your system until tomorrow so for the next 24 hours you should not:  G) Drive an automobile H) Make any legal decisions I) Drink any alcoholic beverage   10) You may resume regular meals tomorrow.  Today it is better to start with liquids and gradually work up to solid foods.  You may eat anything you prefer, but it is better to start with liquids,  then soup and crackers, and gradually work up to solid foods.   11) Please notify your doctor immediately if you have any unusual bleeding, trouble breathing, redness and pain at the surgery site, drainage, fever, or pain not relieved by medication.    12) Additional Instructions:        Please contact your physician with any problems or Same Day Surgery at [phone], Monday through Friday 6 am to 4 pm, or [place] at [facility] number at [phone].AMBULATORY SURGERY  DISCHARGE INSTRUCTIONS   13) The drugs that you were given will stay in your system until tomorrow so for the next 24 hours you should not:  J) Drive an  automobile K) Make any legal decisions L) Drink any alcoholic beverage   14) You may resume regular meals tomorrow.  Today it is better to start with liquids and gradually work up to solid foods.  You may eat anything you prefer, but it is better to start with liquids, then soup and crackers, and gradually work up to solid foods.   15) Please notify your doctor immediately if you have any unusual bleeding, trouble breathing, redness and pain at the surgery site, drainage, fever, or pain not relieved by medication.    16) Additional Instructions:        Please contact your physician with any problems or Same Day Surgery at [phone], Monday through Friday 6 am to 4 pm, or [place] at [facility] number at [phone].AMBULATORY SURGERY  DISCHARGE INSTRUCTIONS   17) The drugs that you were given will stay in your system until tomorrow so for the next 24 hours you should not:  M) Drive an automobile N) Make any legal decisions O) Drink any alcoholic beverage   18) You may resume regular meals tomorrow.  Today it is better to start with liquids and gradually work up to solid foods.  You may eat anything you prefer, but it is better to start with liquids, then soup and crackers, and gradually work up to solid foods.   19) Please notify your doctor immediately if you have any unusual bleeding, trouble breathing, redness and pain at the surgery site, drainage, fever, or pain not relieved by medication.    20) Additional Instructions:        Please contact your physician with any problems or Same Day Surgery at [phone], Monday through Friday 6 am to 4 pm, or [place] at [facility] number at [phone].AMBULATORY SURGERY  DISCHARGE INSTRUCTIONS   21) The drugs that you were given will stay in your system until tomorrow so for the next 24 hours you should not:  P) Drive an automobile Q) Make any legal decisions R) Drink any alcoholic  beverage   22) You may resume regular meals tomorrow.  Today it is better to start with liquids and gradually work up to solid foods.  You may eat anything you prefer, but it is better to start with liquids, then soup and crackers, and gradually work up to solid foods.   23) Please notify your doctor immediately if you have any unusual bleeding, trouble breathing, redness and pain at the surgery site, drainage, fever, or pain not relieved by medication.    24) Additional Instructions:        Please contact your physician with any problems or Same Day Surgery at [phone], Monday through Friday 6 am to 4 pm, or [place] at [facility] number at [phone].

## 2017-11-15 NOTE — H&P (Signed)
The patient has been re-examined, and the chart reviewed, and there have been no interval changes to the documented history and physical.  Plan a right shoulder arthroscopic rotator cuff repair, distal clavicle excision and decompression with biceps tenotomy today.  Anesthesia is consulted regarding a peripheral nerve block for post-operative pain.  The risks, benefits, and alternatives have been discussed at length, and the patient is willing to proceed.

## 2017-11-15 NOTE — Transfer of Care (Signed)
Immediate Anesthesia Transfer of Care Note  Patient: Joshua Mora  Procedure(s) Performed: SHOULDER ARTHROSCOPY WITH ROTATOR CUFF REPAIR (Right Shoulder)  Patient Location: PACU  Anesthesia Type:General  Level of Consciousness: awake, alert  and oriented  Airway & Oxygen Therapy: Patient Spontanous Breathing and Patient connected to face mask oxygen  Post-op Assessment: Report given to RN and Post -op Vital signs reviewed and stable  Post vital signs: Reviewed and stable  Last Vitals:  Vitals Value Taken Time  BP 154/77 11/15/2017 12:49 PM  Temp    Pulse 71 11/15/2017 12:50 PM  Resp 18 11/15/2017 12:49 PM  SpO2 100 % 11/15/2017 12:50 PM  Vitals shown include unvalidated device data.  Last Pain:  Vitals:   11/15/17 0913  TempSrc: Tympanic  PainSc: 0-No pain         Complications: No apparent anesthesia complications

## 2017-11-15 NOTE — Anesthesia Post-op Follow-up Note (Signed)
Anesthesia QCDR form completed.        

## 2017-11-15 NOTE — Anesthesia Procedure Notes (Signed)
Anesthesia Regional Block: Interscalene brachial plexus block   Pre-Anesthetic Checklist: ,, timeout performed, Correct Patient, Correct Site, Correct Laterality, Correct Procedure, Correct Position, site marked, Risks and benefits discussed,  Surgical consent,  Pre-op evaluation,  At surgeon's request and post-op pain management  Laterality: Right  Prep: chloraprep, alcohol swabs       Needles:  Injection technique: Single-shot  Needle Type: Stimiplex     Needle Length: 5cm  Needle Gauge: 22     Additional Needles:   Procedures:, nerve stimulator,,, ultrasound used (permanent image in chart),,,,   Nerve Stimulator or Paresthesia:  Response: biceps flexion, 0.6 mA,   Additional Responses:   Narrative:  Start time: 11/15/2017 10:10 AM End time: 11/15/2017 10:20 AM Injection made incrementally with aspirations every 5 mL.  Performed by: Personally  Anesthesiologist: Alvin Critchley, MD  Additional Notes: Functioning IV was confirmed and monitors were applied.  A 43mm 22ga Stimuplex needle was used. Sterile prep and drape,hand hygiene and sterile gloves were used.  Negative aspiration and negative test dose prior to incremental administration of local anesthetic. The patient tolerated the procedure well.  Easy injection with no pain on injection.  20 cc of Exparel and 10 cc of 0.5% Marcaine PF.

## 2017-11-16 ENCOUNTER — Encounter: Payer: Self-pay | Admitting: Orthopedic Surgery

## 2017-11-20 NOTE — Anesthesia Postprocedure Evaluation (Signed)
Anesthesia Post Note  Patient: Joshua Mora  Procedure(s) Performed: SHOULDER ARTHROSCOPY WITH ROTATOR CUFF REPAIR (Right Shoulder)  Patient location during evaluation: PACU Anesthesia Type: General Level of consciousness: awake and alert and oriented Pain management: pain level controlled Vital Signs Assessment: post-procedure vital signs reviewed and stable Respiratory status: spontaneous breathing Cardiovascular status: blood pressure returned to baseline Anesthetic complications: no     Last Vitals:  Vitals:   11/15/17 1416 11/15/17 1421  BP: 140/74   Pulse: 64   Resp: 18   Temp:    SpO2:  92%    Last Pain:  Vitals:   11/15/17 1416  TempSrc: Temporal  PainSc: 0-No pain                 Rourke Mcquitty

## 2020-03-16 ENCOUNTER — Telehealth (INDEPENDENT_AMBULATORY_CARE_PROVIDER_SITE_OTHER): Payer: Self-pay | Admitting: Gastroenterology

## 2020-03-16 ENCOUNTER — Other Ambulatory Visit: Payer: Self-pay

## 2020-03-16 DIAGNOSIS — Z86010 Personal history of colonic polyps: Secondary | ICD-10-CM

## 2020-03-16 MED ORDER — NA SULFATE-K SULFATE-MG SULF 17.5-3.13-1.6 GM/177ML PO SOLN: 1.0000 | Freq: Once | ORAL | 0 refills | Status: AC

## 2020-03-16 NOTE — Progress Notes (Signed)
Gastroenterology Pre-Procedure Review  Request Date: Thursday 04/02/20 Requesting Physician: Dr. Bonna Gains  PATIENT REVIEW QUESTIONS: The patient responded to the following health history questions as indicated:    1. Are you having any GI issues? no 2. Do you have a personal history of Polyps? Yes 09/12/16 performed by dr. Vira Agar 3. Do you have a family history of Colon Cancer or Polyps? no 4. Diabetes Mellitus? yes (type 2 controlled) 5. Joint replacements in the past 12 months?no 6. Major health problems in the past 3 months?no 7. Any artificial heart valves, MVP, or defibrillator?no    MEDICATIONS & ALLERGIES:    Patient reports the following regarding taking any anticoagulation/antiplatelet therapy:   Plavix, Coumadin, Eliquis, Xarelto, Lovenox, Pradaxa, Brilinta, or Effient? no Aspirin? no  Patient confirms/reports the following medications:  No current outpatient medications on file.   No current facility-administered medications for this visit.    Patient confirms/reports the following allergies:  No Known Allergies  No orders of the defined types were placed in this encounter.   AUTHORIZATION INFORMATION Primary Insurance: 1D#: Group #:  Secondary Insurance: 1D#: Group #:  SCHEDULE INFORMATION: Date: 04/02/20 Time: Location:[facility]

## 2020-03-31 ENCOUNTER — Other Ambulatory Visit: Payer: Self-pay

## 2020-03-31 ENCOUNTER — Other Ambulatory Visit
Admission: RE | Admit: 2020-03-31 | Discharge: 2020-03-31 | Disposition: A | Discharge status: Home / Self Care | Payer: Medicare HMO | Source: Ambulatory Visit | Attending: Gastroenterology | Admitting: Gastroenterology

## 2020-03-31 DIAGNOSIS — Z01812 Encounter for preprocedural laboratory examination: Secondary | ICD-10-CM | POA: Insufficient documentation

## 2020-03-31 DIAGNOSIS — Z20822 Contact with and (suspected) exposure to covid-19: Secondary | ICD-10-CM | POA: Diagnosis not present

## 2020-03-31 LAB — SARS CORONAVIRUS 2 (TAT 6-24 HRS): SARS Coronavirus 2: NEGATIVE

## 2020-04-02 ENCOUNTER — Encounter
Admission: RE | Disposition: A | Discharge status: Home / Self Care | Payer: Self-pay | Source: Home / Self Care | Attending: Gastroenterology

## 2020-04-02 ENCOUNTER — Ambulatory Visit: Payer: Medicare HMO | Admitting: Anesthesiology

## 2020-04-02 ENCOUNTER — Encounter: Payer: Self-pay | Admitting: Gastroenterology

## 2020-04-02 ENCOUNTER — Other Ambulatory Visit: Payer: Self-pay

## 2020-04-02 ENCOUNTER — Ambulatory Visit
Admission: RE | Admit: 2020-04-02 | Discharge: 2020-04-02 | Disposition: A | Discharge status: Home / Self Care | Payer: Medicare HMO | Attending: Gastroenterology | Admitting: Gastroenterology

## 2020-04-02 DIAGNOSIS — Z86010 Personal history of colonic polyps: Secondary | ICD-10-CM

## 2020-04-02 DIAGNOSIS — K635 Polyp of colon: Secondary | ICD-10-CM

## 2020-04-02 DIAGNOSIS — F17290 Nicotine dependence, other tobacco product, uncomplicated: Secondary | ICD-10-CM | POA: Insufficient documentation

## 2020-04-02 DIAGNOSIS — Z1211 Encounter for screening for malignant neoplasm of colon: Secondary | ICD-10-CM | POA: Diagnosis not present

## 2020-04-02 DIAGNOSIS — K5730 Diverticulosis of large intestine without perforation or abscess without bleeding: Secondary | ICD-10-CM | POA: Insufficient documentation

## 2020-04-02 DIAGNOSIS — F17210 Nicotine dependence, cigarettes, uncomplicated: Secondary | ICD-10-CM | POA: Diagnosis not present

## 2020-04-02 DIAGNOSIS — D123 Benign neoplasm of transverse colon: Secondary | ICD-10-CM | POA: Insufficient documentation

## 2020-04-02 DIAGNOSIS — D122 Benign neoplasm of ascending colon: Secondary | ICD-10-CM | POA: Diagnosis not present

## 2020-04-02 DIAGNOSIS — K648 Other hemorrhoids: Secondary | ICD-10-CM | POA: Diagnosis not present

## 2020-04-02 DIAGNOSIS — Z96652 Presence of left artificial knee joint: Secondary | ICD-10-CM | POA: Diagnosis not present

## 2020-04-02 HISTORY — PX: COLONOSCOPY WITH PROPOFOL: SHX5780

## 2020-04-02 SURGERY — COLONOSCOPY WITH PROPOFOL
Anesthesia: General

## 2020-04-02 MED ORDER — LIDOCAINE HCL (CARDIAC) PF 100 MG/5ML IV SOSY
PREFILLED_SYRINGE | INTRAVENOUS | Status: DC | PRN
  Administered 2020-04-02: 50 mg via INTRAVENOUS

## 2020-04-02 MED ORDER — LIDOCAINE HCL (PF) 2 % IJ SOLN
INTRAMUSCULAR | Status: AC
  Filled 2020-04-02: qty 5

## 2020-04-02 MED ORDER — PROPOFOL 500 MG/50ML IV EMUL
INTRAVENOUS | Status: DC | PRN
  Administered 2020-04-02: 175 ug/kg/min via INTRAVENOUS

## 2020-04-02 MED ORDER — PROPOFOL 500 MG/50ML IV EMUL
INTRAVENOUS | Status: AC
  Filled 2020-04-02: qty 50

## 2020-04-02 MED ORDER — PROPOFOL 10 MG/ML IV BOLUS
INTRAVENOUS | Status: DC | PRN
  Administered 2020-04-02: 60 mg via INTRAVENOUS
  Administered 2020-04-02: 20 mg via INTRAVENOUS

## 2020-04-02 MED ORDER — SODIUM CHLORIDE 0.9 % IV SOLN: INTRAVENOUS | Status: DC

## 2020-04-02 NOTE — Anesthesia Postprocedure Evaluation (Signed)
Anesthesia Post Note  Patient: Joshua Mora  Procedure(s) Performed: COLONOSCOPY WITH PROPOFOL (N/A )  Patient location during evaluation: Endoscopy Anesthesia Type: General Level of consciousness: awake Pain management: pain level controlled Vital Signs Assessment: post-procedure vital signs reviewed and stable Respiratory status: spontaneous breathing Cardiovascular status: stable Postop Assessment: no apparent nausea or vomiting Anesthetic complications: no   No complications documented.   Last Vitals:  Vitals:   04/02/20 1050 04/02/20 1100  BP: (!) 142/77 (!) 151/80  Pulse: 76 72  Resp: 18 19  Temp:    SpO2: 98% 97%    Last Pain:  Vitals:   04/02/20 1030  TempSrc: Temporal  PainSc:                  Neva Seat

## 2020-04-02 NOTE — Anesthesia Preprocedure Evaluation (Signed)
Anesthesia Evaluation  Patient identified by MRN, date of birth, ID band Patient awake    Reviewed: Allergy & Precautions, H&P , NPO status , Patient's Chart, lab work & pertinent test results  Airway Mallampati: II       Dental  (+) Upper Dentures, Lower Dentures   Pulmonary neg pulmonary ROS, Current Smoker and Patient abstained from smoking.,    Pulmonary exam normal breath sounds clear to auscultation       Cardiovascular negative cardio ROS Normal cardiovascular exam Rhythm:Regular Rate:Normal     Neuro/Psych negative neurological ROS  negative psych ROS   GI/Hepatic negative GI ROS, Neg liver ROS,   Endo/Other  diabetes  Renal/GU negative Renal ROS  negative genitourinary   Musculoskeletal  (+) Arthritis ,   Abdominal   Peds negative pediatric ROS (+)  Hematology negative hematology ROS (+)   Anesthesia Other Findings Past Medical History: No date: Arthritis No date: Chest pain   Reproductive/Obstetrics negative OB ROS                             Anesthesia Physical Anesthesia Plan  ASA: III  Anesthesia Plan: General   Post-op Pain Management:    Induction: Intravenous  PONV Risk Score and Plan: 1 and Propofol infusion and Treatment may vary due to age or medical condition  Airway Management Planned: Nasal Cannula  Additional Equipment:   Intra-op Plan:   Post-operative Plan:   Informed Consent: I have reviewed the patients History and Physical, chart, labs and discussed the procedure including the risks, benefits and alternatives for the proposed anesthesia with the patient or authorized representative who has indicated his/her understanding and acceptance.     Dental advisory given  Plan Discussed with: CRNA, Anesthesiologist and Surgeon  Anesthesia Plan Comments:         Anesthesia Quick Evaluation

## 2020-04-02 NOTE — Anesthesia Procedure Notes (Signed)
Date/Time: 04/02/2020 9:55 AM Performed by: Johnna Acosta, CRNA Pre-anesthesia Checklist: Emergency Drugs available, Suction available, Patient being monitored, Timeout performed and Patient identified Patient Re-evaluated:Patient Re-evaluated prior to induction Oxygen Delivery Method: Nasal cannula Preoxygenation: Pre-oxygenation with 100% oxygen Induction Type: IV induction

## 2020-04-02 NOTE — H&P (Signed)
  Vonda Antigua, MD [address], [place], [place], [place], [address] [address], [place], [place], [place], [address] Phone: [phone]  Fax: [phone]  Primary Care Physician:  Freddy Finner, NP   Pre-Procedure History & Physical: HPI:  Noemi Ishmael is a 70 y.o. male is here for a colonoscopy.   Past Medical History:  Diagnosis Date  . Arthritis   . Chest pain     Past Surgical History:  Procedure Laterality Date  . COLONOSCOPY WITH PROPOFOL N/A 09/12/2016   Procedure: COLONOSCOPY WITH PROPOFOL;  Surgeon: Manya Silvas, MD;  Location: [facility] ENDOSCOPY;  Service: Endoscopy;  Laterality: N/A;  . JOINT REPLACEMENT     LT TKA 1995  . ROTATOR CUFF REPAIR Left 2013  . SHOULDER ARTHROSCOPY WITH ROTATOR CUFF REPAIR Right 11/15/2017   Procedure: SHOULDER ARTHROSCOPY WITH ROTATOR CUFF REPAIR;  Surgeon: Lovell Sheehan, MD;  Location: [facility] ORS;  Service: Orthopedics;  Laterality: Right;    Prior to Admission medications   Not on File    Allergies as of 03/16/2020  . (No Known Allergies)    History reviewed. No pertinent family history.  Social History   Socioeconomic History  . Marital status: Single    Spouse name: Not on file  . Number of children: Not on file  . Years of education: Not on file  . Highest education level: Not on file  Occupational History  . Not on file  Tobacco Use  . Smoking status: Current Every Day Smoker    Types: Cigarettes, Cigars  . Smokeless tobacco: Never Used  . Tobacco comment: 5 per day  Vaping Use  . Vaping Use: Never used  Substance and Sexual Activity  . Alcohol use: Yes    Comment: occ  . Drug use: No  . Sexual activity: Not on file  Other Topics Concern  . Not on file  Social History Narrative  . Not on file   Social Determinants of Health   Financial Resource Strain: Not on file  Food Insecurity: Not on file  Transportation Needs: Not on file  Physical Activity: Not on file  Stress: Not on  file  Social Connections: Not on file  Intimate Partner Violence: Not on file    Review of Systems: See HPI, otherwise negative ROS  Physical Exam: BP 130/78   Pulse 98   Temp 97.9 F (36.6 C) (Temporal)   Resp 18   Ht 5\' 9"  (1.753 m)   Wt 84.4 kg   SpO2 100%   BMI 27.47 kg/m  General:   Alert,  pleasant and cooperative in NAD Head:  Normocephalic and atraumatic. Neck:  Supple; no masses or thyromegaly. Lungs:  Clear throughout to auscultation, normal respiratory effort.    Heart:  +S1, +S2, Regular rate and rhythm, No edema. Abdomen:  Soft, nontender and nondistended. Normal bowel sounds, without guarding, and without rebound.   Neurologic:  Alert and  oriented x4;  grossly normal neurologically.  Impression/Plan: Buddy Loeffelholz is here for a colonoscopy to be performed for history of adenomatous polyps in 2018  Risks, benefits, limitations, and alternatives regarding  colonoscopy have been reviewed with the patient.  Questions have been answered.  All parties agreeable.   Virgel Manifold, MD  04/02/2020, 9:51 AM

## 2020-04-02 NOTE — Op Note (Signed)
[facility] Gastroenterology Patient Name: Joshua Mora Procedure Date: 04/02/2020 9:53 AM MRN: [id number] Account #: [id number] Date of Birth: 1950/02/25 Admit Type: Outpatient Age: 70 Room: [facility] ENDO ROOM 2 Gender: Male Note Status: Finalized Procedure:             Colonoscopy Indications:           High risk colon cancer surveillance: Personal history                         of colonic polyps Providers:             Dorris Pierre B. Bonna Gains MD, MD Referring MD:          Neomia Dear (Referring MD) Medicines:             Monitored Anesthesia Care Complications:         No immediate complications. Procedure:             Pre-Anesthesia Assessment:                        - ASA Grade Assessment: II - A patient with mild                         systemic disease.                        - Prior to the procedure, a History and Physical was                         performed, and patient medications, allergies and                         sensitivities were reviewed. The patient's tolerance                         of previous anesthesia was reviewed.                        - The risks and benefits of the procedure and the                         sedation options and risks were discussed with the                         patient. All questions were answered and informed                         consent was obtained.                        - Patient identification and proposed procedure were                         verified prior to the procedure by the physician, the                         nurse, the anesthesiologist, the anesthetist and the                         technician. The procedure was  verified in the                         procedure room.                        After obtaining informed consent, the colonoscope was                         passed under direct vision. Throughout the procedure,                         the patient's blood pressure, pulse, and oxygen                          saturations were monitored continuously. The                         Colonoscope was introduced through the anus and                         advanced to the the cecum, identified by appendiceal                         orifice and ileocecal valve. The colonoscopy was                         performed with ease. The patient tolerated the                         procedure well. The quality of the bowel preparation                         was good. Findings:      The perianal and digital rectal examinations were normal.      Two sessile polyps were found in the transverse colon and ascending       colon. The polyps were 4 to 8 mm in size. These polyps were removed with       a cold snare. Resection and retrieval were complete.      Multiple diverticula were found in the sigmoid colon.      The exam was otherwise without abnormality.      The rectum, sigmoid colon, descending colon, transverse colon, ascending       colon and cecum appeared normal.      Non-bleeding internal hemorrhoids were found during retroflexion.      No additional abnormalities were found on retroflexion. Impression:            - Two 4 to 8 mm polyps in the transverse colon and in                         the ascending colon, removed with a cold snare.                         Resected and retrieved.                        - Diverticulosis in the sigmoid colon.                        -  The examination was otherwise normal.                        - The rectum, sigmoid colon, descending colon,                         transverse colon, ascending colon and cecum are normal.                        - Non-bleeding internal hemorrhoids. Recommendation:        - Discharge patient to home (with escort).                        - High fiber diet.                        - Advance diet as tolerated.                        - Continue present medications.                        - Await pathology results.                         - Repeat colonoscopy date to be determined after                         pending pathology results are reviewed.                        - The findings and recommendations were discussed with                         the patient.                        - The findings and recommendations were discussed with                         the patient's family.                        - Return to primary care physician as previously                         scheduled. Procedure Code(s):     --- Professional ---                        [phone], Colonoscopy, flexible; with removal of                         tumor(s), polyp(s), or other lesion(s) by snare                         technique Diagnosis Code(s):     --- Professional ---                        Z86.010, Personal history of colonic polyps  K63.5, Polyp of colon CPT copyright 2019 American Medical Association. All rights reserved. The codes documented in this report are preliminary and upon coder review may  be revised to meet current compliance requirements.  Vonda Antigua, MD Margretta Sidle B. Bonna Gains MD, MD 04/02/2020 10:30:50 AM This report has been signed electronically. Number of Addenda: 0 Note Initiated On: 04/02/2020 9:53 AM Scope Withdrawal Time: 0 hours 19 minutes 8 seconds  Total Procedure Duration: 0 hours 21 minutes 45 seconds  Estimated Blood Loss:  Estimated blood loss: none.      [facility]

## 2020-04-02 NOTE — Transfer of Care (Signed)
Immediate Anesthesia Transfer of Care Note  Patient: Joshua Mora  Procedure(s) Performed: COLONOSCOPY WITH PROPOFOL (N/A )  Patient Location: PACU  Anesthesia Type:General  Level of Consciousness: awake and drowsy  Airway & Oxygen Therapy: Patient Spontanous Breathing  Post-op Assessment: Report given to RN and Post -op Vital signs reviewed and stable  Post vital signs: Reviewed and stable  Last Vitals:  Vitals Value Taken Time  BP 120/73 04/02/20 1033  Temp    Pulse 80 04/02/20 1033  Resp 30 04/02/20 1033  SpO2 99 % 04/02/20 1033  Vitals shown include unvalidated device data.  Last Pain:  Vitals:   04/02/20 0909  TempSrc: Temporal  PainSc: 0-No pain         Complications: No complications documented.

## 2020-04-03 ENCOUNTER — Encounter: Payer: Self-pay | Admitting: Gastroenterology

## 2020-04-03 LAB — SURGICAL PATHOLOGY

## 2020-04-08 ENCOUNTER — Encounter: Payer: Self-pay | Admitting: Gastroenterology

## 2020-09-21 ENCOUNTER — Ambulatory Visit: Payer: Medicare HMO | Admitting: Urology

## 2020-09-21 ENCOUNTER — Encounter: Payer: Self-pay | Admitting: Urology

## 2020-09-21 ENCOUNTER — Other Ambulatory Visit: Payer: Self-pay

## 2020-09-21 VITALS — BP 156/83 | HR 90 | Ht 69.0 in | Wt 193.9 lb

## 2020-09-21 DIAGNOSIS — R350 Frequency of micturition: Secondary | ICD-10-CM

## 2020-09-21 DIAGNOSIS — N5201 Erectile dysfunction due to arterial insufficiency: Secondary | ICD-10-CM

## 2020-09-21 NOTE — Progress Notes (Signed)
09/21/2020 1:16 PM   Clarene Reamer 03/19/1950 [id number]  Referring provider: Freddy Finner, NP [place],  [place] [address]  Chief Complaint  Patient presents with   Erectile Dysfunction    HPI: Joshua Mora is a 70 y.o. male referred for erectile dysfunction and urinary frequency.  Duration ~ 5 years Organic risk factors: Hyperlipidemia and tobacco use-current smoker.  Current smoker and has smoked ~ 30 years at 4-5 cigarettes/day Partial erections which are rarely firm enough for penetration No significant improvement sildenafil 100 mg No pain or curvature with erections  Feels his urinary frequency is secondary to iced tea and soda consumption Voids with a good stream, no urgency, incontinence, dysuria or gross hematuria PSA 09/09/2020 0.5   PMH: Past Medical History:  Diagnosis Date   Arthritis    Chest pain     Surgical History: Past Surgical History:  Procedure Laterality Date   COLONOSCOPY WITH PROPOFOL N/A 09/12/2016   Procedure: COLONOSCOPY WITH PROPOFOL;  Surgeon: Manya Silvas, MD;  Location: [facility] ENDOSCOPY;  Service: Endoscopy;  Laterality: N/A;   COLONOSCOPY WITH PROPOFOL N/A 04/02/2020   Procedure: COLONOSCOPY WITH PROPOFOL;  Surgeon: Virgel Manifold, MD;  Location: [facility] ENDOSCOPY;  Service: Endoscopy;  Laterality: N/A;   JOINT REPLACEMENT     LT TKA 1995   ROTATOR CUFF REPAIR Left 2013   SHOULDER ARTHROSCOPY WITH ROTATOR CUFF REPAIR Right 11/15/2017   Procedure: SHOULDER ARTHROSCOPY WITH ROTATOR CUFF REPAIR;  Surgeon: Lovell Sheehan, MD;  Location: [facility] ORS;  Service: Orthopedics;  Laterality: Right;    Home Medications:  Allergies as of 09/21/2020   No Known Allergies      Medication List        Accurate as of September 21, 2020  1:16 PM. If you have any questions, ask your nurse or doctor.          meclizine 25 MG tablet Commonly known as: ANTIVERT Take 25 mg by mouth every 6 (six) hours as needed.    meloxicam 15 MG tablet Commonly known as: MOBIC Take 15 mg by mouth daily.   Viagra 100 MG tablet Generic drug: sildenafil SMARTSIG:1 Tablet(s) By Mouth   vitamin B-12 1000 MCG tablet Commonly known as: CYANOCOBALAMIN Take 1,000 mcg by mouth daily.        Allergies: No Known Allergies  Family History: History reviewed. No pertinent family history.  Social History:  reports that he has been smoking cigarettes and cigars. He has never used smokeless tobacco. He reports current alcohol use. He reports that he does not use drugs.   Physical Exam: BP (!) 156/83   Pulse 90   Ht '5\' 9"'$  (1.753 m)   Wt 193 lb 14.4 oz (88 kg)   BMI 28.63 kg/m   Constitutional:  Alert and oriented, No acute distress. HEENT: [place] AT, moist mucus membranes.  Trachea midline, no masses. Cardiovascular: No clubbing, cyanosis, or edema. Respiratory: Normal respiratory effort, no increased work of breathing. GI: Abdomen is soft, nontender, nondistended, no abdominal masses GU: Phallus uncircumcised without lesions, testes descended bilaterally with estimated volume 15 cc.  No palpable masses.  Small epididymal cysts bilaterally Skin: No rashes, bruises or suspicious lesions. Neurologic: Grossly intact, no focal deficits, moving all 4 extremities. Psychiatric: Normal mood and affect.   Assessment & Plan:    1.  Erectile dysfunction He has partial erections and sildenafil not effective Unlikely tadalafil would be effective since efficacy similar Second line options were discussed including  vacuum erection devices and intracavernosal injections.  He was given literature to review and if interested in either these options will call back.  He would need a PA appointment for injection training if he desired intracavernosal injections  2.  Urinary frequency He feels this is secondary to consumption of iced tea and sodas He desires to cut back on intake of these beverages to see if his symptoms  improve   Abbie Sons, MD  [place] [address], [place], [place] [address] [phone]

## 2022-08-18 ENCOUNTER — Emergency Department (HOSPITAL_COMMUNITY): Payer: Medicare PPO

## 2022-08-18 ENCOUNTER — Other Ambulatory Visit: Payer: Self-pay

## 2022-08-18 ENCOUNTER — Emergency Department (HOSPITAL_COMMUNITY)
Admission: EM | Admit: 2022-08-18 | Discharge: 2022-08-19 | Disposition: A | Discharge status: Home / Self Care | Payer: Medicare PPO | Attending: Emergency Medicine | Admitting: Emergency Medicine

## 2022-08-18 ENCOUNTER — Encounter (HOSPITAL_COMMUNITY): Payer: Self-pay | Admitting: Emergency Medicine

## 2022-08-18 DIAGNOSIS — Y906 Blood alcohol level of 120-199 mg/100 ml: Secondary | ICD-10-CM | POA: Diagnosis not present

## 2022-08-18 DIAGNOSIS — S61012A Laceration without foreign body of left thumb without damage to nail, initial encounter: Secondary | ICD-10-CM | POA: Diagnosis not present

## 2022-08-18 DIAGNOSIS — S0990XA Unspecified injury of head, initial encounter: Secondary | ICD-10-CM | POA: Insufficient documentation

## 2022-08-18 DIAGNOSIS — S61422A Laceration with foreign body of left hand, initial encounter: Secondary | ICD-10-CM | POA: Diagnosis not present

## 2022-08-18 DIAGNOSIS — W19XXXA Unspecified fall, initial encounter: Secondary | ICD-10-CM

## 2022-08-18 DIAGNOSIS — I6782 Cerebral ischemia: Secondary | ICD-10-CM | POA: Diagnosis not present

## 2022-08-18 DIAGNOSIS — W109XXA Fall (on) (from) unspecified stairs and steps, initial encounter: Secondary | ICD-10-CM | POA: Diagnosis not present

## 2022-08-18 DIAGNOSIS — Z23 Encounter for immunization: Secondary | ICD-10-CM | POA: Diagnosis not present

## 2022-08-18 DIAGNOSIS — F10129 Alcohol abuse with intoxication, unspecified: Secondary | ICD-10-CM | POA: Insufficient documentation

## 2022-08-18 DIAGNOSIS — S81811A Laceration without foreign body, right lower leg, initial encounter: Secondary | ICD-10-CM | POA: Diagnosis not present

## 2022-08-18 DIAGNOSIS — S6992XA Unspecified injury of left wrist, hand and finger(s), initial encounter: Secondary | ICD-10-CM | POA: Diagnosis present

## 2022-08-18 DIAGNOSIS — E119 Type 2 diabetes mellitus without complications: Secondary | ICD-10-CM | POA: Diagnosis not present

## 2022-08-18 DIAGNOSIS — S81812A Laceration without foreign body, left lower leg, initial encounter: Secondary | ICD-10-CM

## 2022-08-18 LAB — ETHANOL: Alcohol, Ethyl (B): 133 mg/dL — ABNORMAL HIGH (ref <10)

## 2022-08-18 LAB — CBC WITH DIFFERENTIAL/PLATELET
Abs Immature Granulocytes: 0.05 10*3/uL (ref 0.00–0.07)
Basophils Absolute: 0 10*3/uL (ref 0.0–0.1)
Basophils Relative: 0 %
Eosinophils Absolute: 0.2 10*3/uL (ref 0.0–0.5)
Eosinophils Relative: 2 %
HCT: 39.6 % (ref 39.0–52.0)
Hemoglobin: 13 g/dL (ref 13.0–17.0)
Immature Granulocytes: 1 %
Lymphocytes Relative: 21 %
Lymphs Abs: 2.1 10*3/uL (ref 0.7–4.0)
MCH: 31.5 pg (ref 26.0–34.0)
MCHC: 32.8 g/dL (ref 30.0–36.0)
MCV: 95.9 fL (ref 80.0–100.0)
Monocytes Absolute: 1 10*3/uL (ref 0.1–1.0)
Monocytes Relative: 10 %
Neutro Abs: 6.7 10*3/uL (ref 1.7–7.7)
Neutrophils Relative %: 66 %
Platelets: 200 10*3/uL (ref 150–400)
RBC: 4.13 MIL/uL — ABNORMAL LOW (ref 4.22–5.81)
RDW: 14.8 % (ref 11.5–15.5)
WBC: 10.1 10*3/uL (ref 4.0–10.5)
nRBC: 0 % (ref 0.0–0.2)

## 2022-08-18 LAB — BASIC METABOLIC PANEL
Anion gap: 10 (ref 5–15)
BUN: 17 mg/dL (ref 8–23)
CO2: 17 mmol/L — ABNORMAL LOW (ref 22–32)
Calcium: 8.6 mg/dL — ABNORMAL LOW (ref 8.9–10.3)
Chloride: 110 mmol/L (ref 98–111)
Creatinine, Ser: 1.74 mg/dL — ABNORMAL HIGH (ref 0.61–1.24)
GFR, Estimated: 41 mL/min — ABNORMAL LOW (ref >60)
Glucose, Bld: 104 mg/dL — ABNORMAL HIGH (ref 70–99)
Potassium: 4.4 mmol/L (ref 3.5–5.1)
Sodium: 137 mmol/L (ref 135–145)

## 2022-08-18 MED ORDER — FENTANYL CITRATE PF 50 MCG/ML IJ SOSY
50.0000 ug | PREFILLED_SYRINGE | Freq: Once | INTRAMUSCULAR | Status: AC
  Administered 2022-08-18: 50 ug via INTRAVENOUS
  Filled 2022-08-18: qty 1

## 2022-08-18 MED ORDER — TETANUS-DIPHTH-ACELL PERTUSSIS 5-2.5-18.5 LF-MCG/0.5 IM SUSY
0.5000 mL | PREFILLED_SYRINGE | Freq: Once | INTRAMUSCULAR | Status: AC
  Administered 2022-08-18: 0.5 mL via INTRAMUSCULAR
  Filled 2022-08-18: qty 0.5

## 2022-08-18 MED ORDER — LIDOCAINE-EPINEPHRINE (PF) 2 %-1:200000 IJ SOLN
20.0000 mL | Freq: Once | INTRAMUSCULAR | Status: AC
  Administered 2022-08-18: 20 mL
  Filled 2022-08-18: qty 20
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
## 2022-08-19 MED ORDER — HYDROCODONE-ACETAMINOPHEN 5-325 MG PO TABS: 1.0000 | ORAL_TABLET | Freq: Four times a day (QID) | ORAL | 0 refills | Status: DC | PRN

## 2022-08-19 NOTE — Discharge Instructions (Signed)
You were evaluated today for injuries sustained during a fall.  You had multiple lacerations which were repaired with sutures and staples. Your right ankle has a total of 5 staples, your right leg has 3 lacerations with 7, 1, and 2 staples. There are a total of 15 staples. Your left hand has two lacerations repaired with 1 suture each. I recommend getting staples and sutures removed in 7-10 days. You may follow up with primary care or any health care provider for removal. I did prescribe a short course of pain medication. If pain is not severe I recommend taking Tylenol. Return to the emergency department if you develop any life threatening symptoms

## 2022-08-19 NOTE — ED Provider Notes (Signed)
[place] EMERGENCY DEPARTMENT AT [facility] Provider Note   CSN: [id number] Arrival date & time: 08/18/22  2222     History  Chief Complaint  Patient presents with   Fall   Laceration    Joshua Mora is a 72 y.o. male.  Patient presents to the emergency department via EMS due to multiple lacerations sustained during a fall.  Patient was reportedly attempting to walk outside to eat his dinner when he tripped and fell through a plate glass door.  He is chief complaint was multiple lacerations with right ankle swelling.  Patient was unsure if he hit his head but denied blood thinners or loss of consciousness.  Patient was unsure of his last tetanus vaccine.  Patient did endorse drinking "3 shots of liquor" and earlier party.  Past medical history significant for arthritis, type II DM  HPI     Home Medications Prior to Admission medications   Medication Sig Start Date End Date Taking? Authorizing Provider  HYDROcodone-acetaminophen (NORCO/VICODIN) 5-325 MG tablet Take 1 tablet by mouth every 6 (six) hours as needed. 08/19/22  Yes Darrick Grinder, PA-C  meclizine (ANTIVERT) 25 MG tablet Take 25 mg by mouth every 6 (six) hours as needed. 09/09/20   [provider]  meloxicam (MOBIC) 15 MG tablet Take 15 mg by mouth daily. 09/09/20   [provider]  VIAGRA 100 MG tablet SMARTSIG:1 Tablet(s) By Mouth 09/09/20   [provider]  vitamin B-12 (CYANOCOBALAMIN) 1000 MCG tablet Take 1,000 mcg by mouth daily. 09/09/20   [provider]      Allergies    Patient has no known allergies.    Review of Systems   Review of Systems  Physical Exam Updated Vital Signs BP (!) 164/83 (BP Location: Right Arm)   Pulse (!) 103   Temp 97.6 F (36.4 C) (Oral)   Resp 19   Ht 5\' 9"  (1.753 m)   Wt 90.7 kg   SpO2 93%   BMI 29.53 kg/m  Physical Exam Vitals and nursing note reviewed.  Constitutional:      General: He is not in acute distress.     Appearance: He is well-developed.  HENT:     Head: Normocephalic and atraumatic.     Comments: No obvious head trauma Eyes:     Conjunctiva/sclera: Conjunctivae normal.  Cardiovascular:     Rate and Rhythm: Normal rate and regular rhythm.     Heart sounds: No murmur heard. Pulmonary:     Effort: Pulmonary effort is normal. No respiratory distress.     Breath sounds: Normal breath sounds.  Abdominal:     Palpations: Abdomen is soft.     Tenderness: There is no abdominal tenderness.  Musculoskeletal:        General: No swelling or deformity.     Cervical back: Normal range of motion and neck supple. No tenderness.     Comments: No significant swelling or tenderness to the right ankle noted.  Skin:    General: Skin is warm and dry.     Capillary Refill: Capillary refill takes less than 2 seconds.     Comments: Multiple lacerations as noted procedure notes.  Right lateral lower leg with 7 cm laceration, 1.5 cm laceration; right knee with 1 cm laceration; right ankle with two 3 cm lacerations; left dorsal hand with 0.5 cm laceration; left thumb with 0.5 cm laceration  Neurological:     Mental Status: He is alert.  Psychiatric:  Mood and Affect: Mood normal.     ED Results / Procedures / Treatments   Labs (all labs ordered are listed, but only abnormal results are displayed) Labs Reviewed  CBC WITH DIFFERENTIAL/PLATELET - Abnormal; Notable for the following components:      Result Value   RBC 4.13 (*)    All other components within normal limits  BASIC METABOLIC PANEL - Abnormal; Notable for the following components:   CO2 17 (*)    Glucose, Bld 104 (*)    Creatinine, Ser 1.74 (*)    Calcium 8.6 (*)    GFR, Estimated 41 (*)    All other components within normal limits  ETHANOL - Abnormal; Notable for the following components:   Alcohol, Ethyl (B) 133 (*)    All other components within normal limits    EKG EKG Interpretation Date/Time:  Thursday August 18 2022  22:26:52 EDT Ventricular Rate:  103 PR Interval:    QRS Duration:  95 QT Interval:  337 QTC Calculation: 442 R Axis:   38  Text Interpretation: Atrial fibrillation Inferior infarct, age indeterminate no prior ECG for comparison. No STEMI Confirmed by Theda Belfast ([id number]) on 08/18/2022 10:32:34 PM  Radiology CT Head Wo Contrast  Result Date: 08/18/2022 CLINICAL DATA:  Recent fall with headaches and neck pain, initial encounter EXAM: CT HEAD WITHOUT CONTRAST CT CERVICAL SPINE WITHOUT CONTRAST TECHNIQUE: Multidetector CT imaging of the head and cervical spine was performed following the standard protocol without intravenous contrast. Multiplanar CT image reconstructions of the cervical spine were also generated. RADIATION DOSE REDUCTION: This exam was performed according to the departmental dose-optimization program which includes automated exposure control, adjustment of the mA and/or kV according to patient size and/or use of iterative reconstruction technique. COMPARISON:  None Available. FINDINGS: CT HEAD FINDINGS Brain: No evidence of acute infarction, hemorrhage, hydrocephalus, extra-axial collection or mass lesion/mass effect. 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 Mild atrophic changes and chronic white matter ischemic changes are noted. Vascular: No hyperdense vessel or unexpected calcification. Skull: Normal. Negative for fracture or focal lesion. Sinuses/Orbits: Orbits and their contents are within normal limits. Air-fluid level is noted within the sphenoid sinus. Other: None CT CERVICAL SPINE FINDINGS Alignment: Within normal limits. Skull base and vertebrae: 7 cervical segments are well visualized. Vertebral body height and alignment are well maintained. Multilevel osteophytic changes and facet hypertrophic changes are seen. The odontoid is within normal limits. No acute fracture or acute facet abnormality is noted. Soft tissues and  spinal canal: Surrounding soft tissue structures are within normal limits. Upper chest: Emphysematous changes are noted in the lung apices. Other: None IMPRESSION: CT of the head: No acute intracranial abnormality noted. Chronic ischemic changes are noted. Air-fluid level in the sphenoid sinus. CT of the cervical spine: Multilevel degenerative change without acute abnormality. Electronically Signed   By: Alcide Clever M.D.   On: 08/18/2022 23:44   CT Cervical Spine Wo Contrast  Result Date: 08/18/2022 CLINICAL DATA:  Recent fall with headaches and neck pain, initial encounter EXAM: CT HEAD WITHOUT CONTRAST CT CERVICAL SPINE WITHOUT CONTRAST TECHNIQUE: Multidetector CT imaging of the head and cervical spine was performed following the standard protocol without intravenous contrast. Multiplanar CT image reconstructions of the cervical spine were also generated. RADIATION DOSE REDUCTION: This exam was performed according to the departmental dose-optimization program which includes automated exposure control, adjustment of the mA and/or kV according to patient size and/or use  of iterative reconstruction technique. COMPARISON:  None Available. FINDINGS: CT HEAD FINDINGS Brain: No evidence of acute infarction, hemorrhage, hydrocephalus, extra-axial collection or mass lesion/mass effect. 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 Mild atrophic changes and chronic white matter ischemic changes are noted. Vascular: No hyperdense vessel or unexpected calcification. Skull: Normal. Negative for fracture or focal lesion. Sinuses/Orbits: Orbits and their contents are within normal limits. Air-fluid level is noted within the sphenoid sinus. Other: None CT CERVICAL SPINE FINDINGS Alignment: Within normal limits. Skull base and vertebrae: 7 cervical segments are well visualized. Vertebral body height and alignment are well maintained. Multilevel osteophytic changes and facet  hypertrophic changes are seen. The odontoid is within normal limits. No acute fracture or acute facet abnormality is noted. Soft tissues and spinal canal: Surrounding soft tissue structures are within normal limits. Upper chest: Emphysematous changes are noted in the lung apices. Other: None IMPRESSION: CT of the head: No acute intracranial abnormality noted. Chronic ischemic changes are noted. Air-fluid level in the sphenoid sinus. CT of the cervical spine: Multilevel degenerative change without acute abnormality. Electronically Signed   By: Alcide Clever M.D.   On: 08/18/2022 23:44   DG Ankle Complete Right  Result Date: 08/18/2022 CLINICAL DATA:  Recent fall EXAM: RIGHT ANKLE - COMPLETE 3+ VIEW COMPARISON:  None Available. FINDINGS: Radiopaque densities are again noted along the lateral ankle consistent with small foreign bodies. No acute fracture or dislocation is noted. No other focal abnormality is noted. IMPRESSION: Soft tissue foreign bodies laterally. No acute bony abnormality is seen. Electronically Signed   By: Alcide Clever M.D.   On: 08/18/2022 23:19   DG Tibia/Fibula Right  Result Date: 08/18/2022 CLINICAL DATA:  Recent fall, evaluate for possible foreign body EXAM: RIGHT TIBIA AND FIBULA - 2 VIEW COMPARISON:  None Available. FINDINGS: No acute fracture or dislocation is noted. Some radiopaque densities are noted along the lateral aspect of the ankle which may be adherent to the skin. An additional radiopaque density is noted along the midportion of the fibula projecting in the posterior soft tissues. Multiple foreign bodies are noted along the anterior aspect of the tibia on the lateral film better visualized than on frontal film. IMPRESSION: Multiple foreign bodies as described. No acute bony abnormality is seen. Electronically Signed   By: Alcide Clever M.D.   On: 08/18/2022 23:18   DG Wrist Complete Left  Result Date: 08/18/2022 CLINICAL DATA:  Recent fall with left wrist pain, initial  encounter EXAM: LEFT WRIST - COMPLETE 3+ VIEW COMPARISON:  None Available. FINDINGS: Mild degenerative changes of the first [facility] joint are noted. No acute fracture or dislocation is noted. Radiopaque foreign bodies are noted along the base of the thumb at the first MCP joint. Foreign body is also noted in the posterior soft tissues near the carpometacarpal junction. Soft tissue swelling related to the recent injury is seen. IMPRESSION: No acute bony abnormality is noted. Multiple radiopaque foreign bodies are seen. Electronically Signed   By: Alcide Clever M.D.   On: 08/18/2022 23:17    Procedures .Marland KitchenLaceration Repair  Date/Time: 08/19/2022 12:16 AM  Performed by: Darrick Grinder, PA-C Authorized by: Darrick Grinder, PA-C   Consent:    Consent obtained:  Verbal   Consent given by:  Patient   Risks, benefits, and alternatives were discussed: yes   Universal protocol:    Patient identity confirmed:  Arm band Anesthesia:    Anesthesia  method:  Local infiltration   Local anesthetic:  Lidocaine 2% WITH epi Laceration details:    Location:  Leg   Leg location:  R lower leg (Right ankle)   Length (cm):  3   Depth (mm):  2 Pre-procedure details:    Preparation:  Imaging obtained to evaluate for foreign bodies Exploration:    Hemostasis achieved with:  Direct pressure Treatment:    Area cleansed with:  Saline   Amount of cleaning:  Extensive Skin repair:    Repair method:  Staples   Number of staples:  2 Approximation:    Approximation:  Close Repair type:    Repair type:  Simple Post-procedure details:    Dressing:  Bulky dressing .Marland KitchenLaceration Repair  Date/Time: 08/19/2022 12:18 AM  Performed by: Darrick Grinder, PA-C Authorized by: Darrick Grinder, PA-C   Consent:    Consent obtained:  Verbal   Consent given by:  Patient   Risks, benefits, and alternatives were discussed: yes   Universal protocol:    Patient identity confirmed:  Arm band Anesthesia:    Anesthesia method:   Local infiltration   Local anesthetic:  Lidocaine 2% WITH epi Laceration details:    Location:  Leg   Leg location:  R lower leg   Length (cm):  3 Pre-procedure details:    Preparation:  Patient was prepped and draped in usual sterile fashion and imaging obtained to evaluate for foreign bodies Exploration:    Hemostasis achieved with:  Direct pressure Treatment:    Area cleansed with:  Saline   Amount of cleaning:  Standard Skin repair:    Repair method:  Staples   Number of staples:  3 Approximation:    Approximation:  Close Post-procedure details:    Dressing:  Bulky dressing   Procedure completion:  Tolerated well, no immediate complications .Marland KitchenLaceration Repair  Date/Time: 08/19/2022 12:19 AM  Performed by: Darrick Grinder, PA-C Authorized by: Darrick Grinder, PA-C   Consent:    Consent obtained:  Verbal   Consent given by:  Patient   Risks, benefits, and alternatives were discussed: yes   Universal protocol:    Patient identity confirmed:  Arm band Laceration details:    Location:  Leg   Leg location:  R lower leg   Length (cm):  7 Pre-procedure details:    Preparation:  Patient was prepped and draped in usual sterile fashion and imaging obtained to evaluate for foreign bodies Treatment:    Area cleansed with:  Saline   Amount of cleaning:  Extensive Skin repair:    Repair method:  Staples   Number of staples:  7 Approximation:    Approximation:  Close Repair type:    Repair type:  Intermediate Post-procedure details:    Dressing:  Bulky dressing .Marland KitchenLaceration Repair  Date/Time: 08/19/2022 12:20 AM  Performed by: Darrick Grinder, PA-C Authorized by: Darrick Grinder, PA-C   Consent:    Consent obtained:  Verbal   Consent given by:  Patient   Risks, benefits, and alternatives were discussed: yes   Universal protocol:    Patient identity confirmed:  Arm band Anesthesia:    Anesthesia method:  Local infiltration   Local anesthetic:  Lidocaine 2%  WITH epi Laceration details:    Location:  Leg   Leg location:  R knee   Length (cm):  1 Pre-procedure details:    Preparation:  Patient was prepped and draped in usual sterile fashion and imaging obtained to evaluate for foreign  bodies Exploration:    Hemostasis achieved with:  Direct pressure Treatment:    Area cleansed with:  Saline   Amount of cleaning:  Standard Skin repair:    Repair method:  Staples   Number of staples:  1 Approximation:    Approximation:  Close Repair type:    Repair type:  Simple Post-procedure details:    Dressing:  Bulky dressing .Marland KitchenLaceration Repair  Date/Time: 08/19/2022 12:20 AM  Performed by: Darrick Grinder, PA-C Authorized by: Darrick Grinder, PA-C   Consent:    Consent obtained:  Verbal   Consent given by:  Patient   Risks, benefits, and alternatives were discussed: yes   Universal protocol:    Patient identity confirmed:  Arm band Anesthesia:    Anesthesia method:  Local infiltration   Local anesthetic:  Lidocaine 2% WITH epi Laceration details:    Location:  Leg   Leg location:  R lower leg   Length (cm):  1.5 Pre-procedure details:    Preparation:  Patient was prepped and draped in usual sterile fashion and imaging obtained to evaluate for foreign bodies Treatment:    Area cleansed with:  Saline   Amount of cleaning:  Extensive Skin repair:    Repair method:  Staples   Number of staples:  2 Approximation:    Approximation:  Close Repair type:    Repair type:  Intermediate Post-procedure details:    Dressing:  Bulky dressing .Marland KitchenLaceration Repair  Date/Time: 08/19/2022 12:21 AM  Performed by: Darrick Grinder, PA-C Authorized by: Darrick Grinder, PA-C   Consent:    Consent obtained:  Verbal   Consent given by:  Patient   Risks, benefits, and alternatives were discussed: yes   Universal protocol:    Patient identity confirmed:  Arm band Anesthesia:    Anesthesia method:  Local infiltration   Local anesthetic:   Lidocaine 2% WITH epi Laceration details:    Location:  Finger   Finger location:  L thumb   Length (cm):  0.5 Pre-procedure details:    Preparation:  Patient was prepped and draped in usual sterile fashion and imaging obtained to evaluate for foreign bodies Exploration:    Hemostasis achieved with:  Direct pressure Treatment:    Area cleansed with:  Saline   Amount of cleaning:  Standard Skin repair:    Repair method:  Sutures   Suture size:  5-0   Suture material:  Prolene   Suture technique:  Simple interrupted   Number of sutures:  1 Approximation:    Approximation:  Close Repair type:    Repair type:  Simple Post-procedure details:    Dressing:  Sterile dressing .Marland KitchenLaceration Repair  Date/Time: 08/19/2022 12:22 AM  Performed by: Darrick Grinder, PA-C Authorized by: Darrick Grinder, PA-C   Consent:    Consent obtained:  Verbal   Consent given by:  Patient   Risks, benefits, and alternatives were discussed: yes   Universal protocol:    Patient identity confirmed:  Arm band Anesthesia:    Anesthesia method:  Local infiltration   Local anesthetic:  Lidocaine 2% WITH epi Laceration details:    Location:  Hand   Hand location:  R hand, dorsum   Length (cm):  0.5 Pre-procedure details:    Preparation:  Patient was prepped and draped in usual sterile fashion and imaging obtained to evaluate for foreign bodies Treatment:    Area cleansed with:  Saline   Amount of cleaning:  Standard Skin repair:  Repair method:  Sutures   Suture size:  5-0   Suture material:  Prolene   Suture technique:  Simple interrupted   Number of sutures:  1 Approximation:    Approximation:  Close Repair type:    Repair type:  Simple Post-procedure details:    Dressing:  Sterile dressing     Medications Ordered in ED Medications  lidocaine-EPINEPHrine (XYLOCAINE W/EPI) 2 %-1:200000 (PF) injection 20 mL (20 mLs Infiltration Given by Other 08/18/22 2348)  Tdap (BOOSTRIX) injection 0.5  mL (0.5 mLs Intramuscular Given 08/18/22 2347)  fentaNYL (SUBLIMAZE) injection 50 mcg (50 mcg Intravenous Given 08/18/22 2355)    ED Course/ Medical Decision Making/ A&P                             Medical Decision Making Amount and/or Complexity of Data Reviewed Labs: ordered. Radiology: ordered.  Risk Prescription drug management.   This patient presents to the ED for concern of injuries from a fall, this involves an extensive number of treatment options, and is a complaint that carries with it a high risk of complications and morbidity.  The differential diagnosis includes intracranial abnormality, fracture, dislocation, soft tissue injury, others   Co morbidities that complicate the patient evaluation  Patient consumed alcohol earlier before the incident   Additional history obtained:  Additional history obtained from patient's son and EMS  Lab Tests:  I Ordered, and personally interpreted labs.  The pertinent results include: Ethanol 133, unremarkable CBC, creatinine 1.74 (unclear patient's baseline)   Imaging Studies ordered:  I ordered imaging studies including plain films of the left wrist, right tibia-fibula, right ankle; CT scans of the head and cervical spine I independently visualized and interpreted imaging which showed no acute findings on cervical spine or head CT.  No fracture or dislocation on plain films but radiopaque foreign bodies noted at level of the skin. I agree with the radiologist interpretation   Cardiac Monitoring: / EKG:  The patient was maintained on a cardiac monitor.  I personally viewed and interpreted the cardiac monitored which showed an underlying rhythm of: Rate controlled atrial fibrillation   Problem List / ED Course / Critical interventions / Medication management   I ordered medication including fentanyl for pain, Tdap for booster Reevaluation of the patient after these medicines showed that the patient improved I have reviewed  the patients home medicines and have made adjustments as needed   Test / Admission - Considered:  Patient with no acute abnormalities on imaging.  Patient not clinically intoxicated at this time.  Fall sounds mechanical with patient remembering tripping over something before falling through the door.  Lacerations were repaired as noted.  Plan to discharge patient with short course of pain medication and recommendations for follow-up with his primary care care team for suture and staple removal.  Discharge home.         Final Clinical Impression(s) / ED Diagnoses Final diagnoses:  Laceration of left hand with foreign body, initial encounter  Laceration of left thumb without foreign body without damage to nail, initial encounter  Leg laceration, left, initial encounter  Fall, initial encounter    Rx / DC Orders ED Discharge Orders          Ordered    HYDROcodone-acetaminophen (NORCO/VICODIN) 5-325 MG tablet  Every 6 hours PRN        08/19/22 0031  Darrick Grinder, PA-C 08/19/22 0034    Sabas Sous, MD 08/19/22 [id number]

## 2022-10-07 ENCOUNTER — Ambulatory Visit
Admission: RE | Admit: 2022-10-07 | Discharge: 2022-10-07 | Disposition: A | Discharge status: Home / Self Care | Payer: Medicare PPO | Attending: Primary Care | Admitting: Primary Care

## 2022-10-07 ENCOUNTER — Other Ambulatory Visit: Payer: Self-pay | Admitting: Primary Care

## 2022-10-07 ENCOUNTER — Ambulatory Visit
Admission: RE | Admit: 2022-10-07 | Discharge: 2022-10-07 | Disposition: A | Discharge status: Home / Self Care | Payer: Medicare PPO | Source: Ambulatory Visit | Attending: Primary Care | Admitting: Primary Care

## 2022-10-07 DIAGNOSIS — G8929 Other chronic pain: Secondary | ICD-10-CM | POA: Diagnosis present

## 2022-10-07 DIAGNOSIS — M25562 Pain in left knee: Secondary | ICD-10-CM | POA: Insufficient documentation

## 2022-10-07 DIAGNOSIS — M25561 Pain in right knee: Secondary | ICD-10-CM | POA: Insufficient documentation

## 2022-10-13 ENCOUNTER — Other Ambulatory Visit: Payer: Self-pay

## 2022-10-13 ENCOUNTER — Inpatient Hospital Stay
Admission: EM | Admit: 2022-10-13 | Discharge: 2022-11-04 | DRG: 871 | Disposition: A | Discharge status: Skilled Nursing Facility | Payer: Medicare PPO | Attending: Internal Medicine | Admitting: Internal Medicine

## 2022-10-13 ENCOUNTER — Inpatient Hospital Stay: Payer: Medicare PPO

## 2022-10-13 ENCOUNTER — Emergency Department: Payer: Medicare PPO

## 2022-10-13 DIAGNOSIS — Z515 Encounter for palliative care: Secondary | ICD-10-CM | POA: Diagnosis not present

## 2022-10-13 DIAGNOSIS — D72825 Bandemia: Secondary | ICD-10-CM | POA: Diagnosis not present

## 2022-10-13 DIAGNOSIS — S93492A Sprain of other ligament of left ankle, initial encounter: Secondary | ICD-10-CM | POA: Diagnosis present

## 2022-10-13 DIAGNOSIS — N390 Urinary tract infection, site not specified: Secondary | ICD-10-CM | POA: Diagnosis present

## 2022-10-13 DIAGNOSIS — D75839 Thrombocytosis, unspecified: Secondary | ICD-10-CM | POA: Diagnosis not present

## 2022-10-13 DIAGNOSIS — D649 Anemia, unspecified: Secondary | ICD-10-CM | POA: Diagnosis not present

## 2022-10-13 DIAGNOSIS — J9601 Acute respiratory failure with hypoxia: Secondary | ICD-10-CM | POA: Diagnosis present

## 2022-10-13 DIAGNOSIS — Z79899 Other long term (current) drug therapy: Secondary | ICD-10-CM

## 2022-10-13 DIAGNOSIS — R7989 Other specified abnormal findings of blood chemistry: Secondary | ICD-10-CM | POA: Diagnosis present

## 2022-10-13 DIAGNOSIS — R6520 Severe sepsis without septic shock: Secondary | ICD-10-CM | POA: Diagnosis present

## 2022-10-13 DIAGNOSIS — E875 Hyperkalemia: Secondary | ICD-10-CM | POA: Diagnosis present

## 2022-10-13 DIAGNOSIS — E8721 Acute metabolic acidosis: Secondary | ICD-10-CM | POA: Diagnosis present

## 2022-10-13 DIAGNOSIS — B961 Klebsiella pneumoniae [K. pneumoniae] as the cause of diseases classified elsewhere: Secondary | ICD-10-CM | POA: Diagnosis present

## 2022-10-13 DIAGNOSIS — R34 Anuria and oliguria: Secondary | ICD-10-CM | POA: Diagnosis present

## 2022-10-13 DIAGNOSIS — Z791 Long term (current) use of non-steroidal anti-inflammatories (NSAID): Secondary | ICD-10-CM

## 2022-10-13 DIAGNOSIS — Z8673 Personal history of transient ischemic attack (TIA), and cerebral infarction without residual deficits: Secondary | ICD-10-CM

## 2022-10-13 DIAGNOSIS — T82868A Thrombosis of vascular prosthetic devices, implants and grafts, initial encounter: Secondary | ICD-10-CM | POA: Diagnosis not present

## 2022-10-13 DIAGNOSIS — R296 Repeated falls: Secondary | ICD-10-CM | POA: Diagnosis not present

## 2022-10-13 DIAGNOSIS — Z96652 Presence of left artificial knee joint: Secondary | ICD-10-CM | POA: Diagnosis present

## 2022-10-13 DIAGNOSIS — T39395A Adverse effect of other nonsteroidal anti-inflammatory drugs [NSAID], initial encounter: Secondary | ICD-10-CM | POA: Diagnosis present

## 2022-10-13 DIAGNOSIS — S9002XA Contusion of left ankle, initial encounter: Secondary | ICD-10-CM | POA: Diagnosis present

## 2022-10-13 DIAGNOSIS — Z992 Dependence on renal dialysis: Secondary | ICD-10-CM

## 2022-10-13 DIAGNOSIS — D631 Anemia in chronic kidney disease: Secondary | ICD-10-CM | POA: Diagnosis present

## 2022-10-13 DIAGNOSIS — R531 Weakness: Secondary | ICD-10-CM | POA: Diagnosis not present

## 2022-10-13 DIAGNOSIS — I2489 Other forms of acute ischemic heart disease: Secondary | ICD-10-CM | POA: Diagnosis present

## 2022-10-13 DIAGNOSIS — A419 Sepsis, unspecified organism: Secondary | ICD-10-CM | POA: Diagnosis not present

## 2022-10-13 DIAGNOSIS — G9341 Metabolic encephalopathy: Secondary | ICD-10-CM | POA: Diagnosis present

## 2022-10-13 DIAGNOSIS — F17200 Nicotine dependence, unspecified, uncomplicated: Secondary | ICD-10-CM | POA: Diagnosis present

## 2022-10-13 DIAGNOSIS — S93402A Sprain of unspecified ligament of left ankle, initial encounter: Secondary | ICD-10-CM

## 2022-10-13 DIAGNOSIS — S82839A Other fracture of upper and lower end of unspecified fibula, initial encounter for closed fracture: Secondary | ICD-10-CM | POA: Diagnosis present

## 2022-10-13 DIAGNOSIS — M7989 Other specified soft tissue disorders: Secondary | ICD-10-CM

## 2022-10-13 DIAGNOSIS — D72829 Elevated white blood cell count, unspecified: Secondary | ICD-10-CM | POA: Diagnosis present

## 2022-10-13 DIAGNOSIS — N1832 Chronic kidney disease, stage 3b: Secondary | ICD-10-CM | POA: Diagnosis not present

## 2022-10-13 DIAGNOSIS — F17210 Nicotine dependence, cigarettes, uncomplicated: Secondary | ICD-10-CM | POA: Diagnosis present

## 2022-10-13 DIAGNOSIS — N179 Acute kidney failure, unspecified: Secondary | ICD-10-CM | POA: Diagnosis present

## 2022-10-13 DIAGNOSIS — E785 Hyperlipidemia, unspecified: Secondary | ICD-10-CM | POA: Diagnosis present

## 2022-10-13 DIAGNOSIS — I5033 Acute on chronic diastolic (congestive) heart failure: Secondary | ICD-10-CM | POA: Diagnosis present

## 2022-10-13 DIAGNOSIS — Z452 Encounter for adjustment and management of vascular access device: Secondary | ICD-10-CM | POA: Diagnosis not present

## 2022-10-13 DIAGNOSIS — Z1611 Resistance to penicillins: Secondary | ICD-10-CM | POA: Diagnosis present

## 2022-10-13 DIAGNOSIS — T796XXA Traumatic ischemia of muscle, initial encounter: Secondary | ICD-10-CM | POA: Diagnosis present

## 2022-10-13 DIAGNOSIS — T82898A Other specified complication of vascular prosthetic devices, implants and grafts, initial encounter: Secondary | ICD-10-CM | POA: Diagnosis not present

## 2022-10-13 DIAGNOSIS — A4159 Other Gram-negative sepsis: Secondary | ICD-10-CM | POA: Diagnosis present

## 2022-10-13 DIAGNOSIS — F1010 Alcohol abuse, uncomplicated: Secondary | ICD-10-CM | POA: Diagnosis present

## 2022-10-13 DIAGNOSIS — N170 Acute kidney failure with tubular necrosis: Secondary | ICD-10-CM | POA: Diagnosis not present

## 2022-10-13 DIAGNOSIS — M25572 Pain in left ankle and joints of left foot: Secondary | ICD-10-CM | POA: Diagnosis not present

## 2022-10-13 DIAGNOSIS — N186 End stage renal disease: Secondary | ICD-10-CM | POA: Diagnosis present

## 2022-10-13 DIAGNOSIS — W19XXXA Unspecified fall, initial encounter: Secondary | ICD-10-CM | POA: Diagnosis present

## 2022-10-13 DIAGNOSIS — R0602 Shortness of breath: Secondary | ICD-10-CM | POA: Diagnosis not present

## 2022-10-13 DIAGNOSIS — Z751 Person awaiting admission to adequate facility elsewhere: Secondary | ICD-10-CM

## 2022-10-13 DIAGNOSIS — Z1152 Encounter for screening for COVID-19: Secondary | ICD-10-CM | POA: Diagnosis not present

## 2022-10-13 DIAGNOSIS — T82858A Stenosis of vascular prosthetic devices, implants and grafts, initial encounter: Secondary | ICD-10-CM | POA: Diagnosis not present

## 2022-10-13 DIAGNOSIS — E559 Vitamin D deficiency, unspecified: Secondary | ICD-10-CM | POA: Diagnosis present

## 2022-10-13 DIAGNOSIS — M25562 Pain in left knee: Secondary | ICD-10-CM | POA: Diagnosis not present

## 2022-10-13 DIAGNOSIS — E860 Dehydration: Secondary | ICD-10-CM | POA: Diagnosis present

## 2022-10-13 DIAGNOSIS — E876 Hypokalemia: Secondary | ICD-10-CM | POA: Diagnosis not present

## 2022-10-13 DIAGNOSIS — Z23 Encounter for immunization: Secondary | ICD-10-CM | POA: Diagnosis not present

## 2022-10-13 DIAGNOSIS — R509 Fever, unspecified: Secondary | ICD-10-CM | POA: Diagnosis not present

## 2022-10-13 DIAGNOSIS — Z7901 Long term (current) use of anticoagulants: Secondary | ICD-10-CM

## 2022-10-13 LAB — BASIC METABOLIC PANEL
Anion gap: 12 (ref 5–15)
BUN: 72 mg/dL — ABNORMAL HIGH (ref 8–23)
CO2: 10 mmol/L — ABNORMAL LOW (ref 22–32)
Calcium: 8.9 mg/dL (ref 8.9–10.3)
Chloride: 116 mmol/L — ABNORMAL HIGH (ref 98–111)
Creatinine, Ser: 6.3 mg/dL — ABNORMAL HIGH (ref 0.61–1.24)
GFR, Estimated: 9 mL/min — ABNORMAL LOW (ref >60)
Glucose, Bld: 102 mg/dL — ABNORMAL HIGH (ref 70–99)
Potassium: 4.2 mmol/L (ref 3.5–5.1)
Sodium: 138 mmol/L (ref 135–145)

## 2022-10-13 LAB — BLOOD GAS, ARTERIAL
Acid-base deficit: 16.3 mmol/L — ABNORMAL HIGH (ref 0.0–2.0)
Bicarbonate: 9 mmol/L — ABNORMAL LOW (ref 20.0–28.0)
O2 Content: 15 L/min
O2 Saturation: 100 %
Patient temperature: 37
pCO2 arterial: 21 mmHg — ABNORMAL LOW (ref 32–48)
pH, Arterial: 7.24 — ABNORMAL LOW (ref 7.35–7.45)
pO2, Arterial: 164 mmHg — ABNORMAL HIGH (ref 83–108)

## 2022-10-13 LAB — CBC
HCT: 40.4 % (ref 39.0–52.0)
HCT: 40.1 % (ref 39.0–52.0)
Hemoglobin: 13.5 g/dL (ref 13.0–17.0)
Hemoglobin: 13.2 g/dL (ref 13.0–17.0)
MCH: 30.9 pg (ref 26.0–34.0)
MCH: 30.2 pg (ref 26.0–34.0)
MCHC: 33.4 g/dL (ref 30.0–36.0)
MCHC: 32.9 g/dL (ref 30.0–36.0)
MCV: 92.4 fL (ref 80.0–100.0)
MCV: 91.8 fL (ref 80.0–100.0)
Platelets: 384 10*3/uL (ref 150–400)
Platelets: 371 10*3/uL (ref 150–400)
RBC: 4.37 MIL/uL (ref 4.22–5.81)
RBC: 4.37 MIL/uL (ref 4.22–5.81)
RDW: 15.9 % — ABNORMAL HIGH (ref 11.5–15.5)
RDW: 15.9 % — ABNORMAL HIGH (ref 11.5–15.5)
WBC: 46.7 10*3/uL — ABNORMAL HIGH (ref 4.0–10.5)
WBC: 45.3 10*3/uL — ABNORMAL HIGH (ref 4.0–10.5)
nRBC: 0 % (ref 0.0–0.2)
nRBC: 0 % (ref 0.0–0.2)

## 2022-10-13 LAB — CBC WITH DIFFERENTIAL/PLATELET
Abs Immature Granulocytes: 0.37 10*3/uL — ABNORMAL HIGH (ref 0.00–0.07)
Basophils Absolute: 0.2 10*3/uL — ABNORMAL HIGH (ref 0.0–0.1)
Basophils Relative: 1 %
Eosinophils Absolute: 0.1 10*3/uL (ref 0.0–0.5)
Eosinophils Relative: 0 %
HCT: 40.1 % (ref 39.0–52.0)
Hemoglobin: 13.1 g/dL (ref 13.0–17.0)
Immature Granulocytes: 1 %
Lymphocytes Relative: 4 %
Lymphs Abs: 1.2 10*3/uL (ref 0.7–4.0)
MCH: 30.5 pg (ref 26.0–34.0)
MCHC: 32.7 g/dL (ref 30.0–36.0)
MCV: 93.3 fL (ref 80.0–100.0)
Monocytes Absolute: 0.4 10*3/uL (ref 0.1–1.0)
Monocytes Relative: 1 %
Neutro Abs: 25.6 10*3/uL — ABNORMAL HIGH (ref 1.7–7.7)
Neutrophils Relative %: 93 %
Platelets: 310 10*3/uL (ref 150–400)
RBC: 4.3 MIL/uL (ref 4.22–5.81)
RDW: 15.9 % — ABNORMAL HIGH (ref 11.5–15.5)
Smear Review: NORMAL
WBC Morphology: INCREASED
WBC: 27.8 10*3/uL — ABNORMAL HIGH (ref 4.0–10.5)
nRBC: 0.1 % (ref 0.0–0.2)

## 2022-10-13 LAB — DIFFERENTIAL
Abs Immature Granulocytes: 1.15 10*3/uL — ABNORMAL HIGH (ref 0.00–0.07)
Basophils Absolute: 0 10*3/uL (ref 0.0–0.1)
Basophils Relative: 0 %
Eosinophils Absolute: 0 10*3/uL (ref 0.0–0.5)
Eosinophils Relative: 0 %
Immature Granulocytes: 3 %
Lymphocytes Relative: 4 %
Lymphs Abs: 1.9 10*3/uL (ref 0.7–4.0)
Monocytes Absolute: 3 10*3/uL — ABNORMAL HIGH (ref 0.1–1.0)
Monocytes Relative: 7 %
Neutro Abs: 39.3 10*3/uL — ABNORMAL HIGH (ref 1.7–7.7)
Neutrophils Relative %: 86 %
Smear Review: NORMAL
WBC Morphology: INCREASED

## 2022-10-13 LAB — COMPREHENSIVE METABOLIC PANEL
ALT: 27 U/L (ref 0–44)
AST: 48 U/L — ABNORMAL HIGH (ref 15–41)
Albumin: 3.1 g/dL — ABNORMAL LOW (ref 3.5–5.0)
Alkaline Phosphatase: 41 U/L (ref 38–126)
Anion gap: 18 — ABNORMAL HIGH (ref 5–15)
BUN: 75 mg/dL — ABNORMAL HIGH (ref 8–23)
CO2: 15 mmol/L — ABNORMAL LOW (ref 22–32)
Calcium: 8.7 mg/dL — ABNORMAL LOW (ref 8.9–10.3)
Chloride: 111 mmol/L (ref 98–111)
Creatinine, Ser: 6.55 mg/dL — ABNORMAL HIGH (ref 0.61–1.24)
GFR, Estimated: 8 mL/min — ABNORMAL LOW (ref >60)
Glucose, Bld: 106 mg/dL — ABNORMAL HIGH (ref 70–99)
Potassium: 4.6 mmol/L (ref 3.5–5.1)
Sodium: 144 mmol/L (ref 135–145)
Total Bilirubin: 0.8 mg/dL (ref 0.3–1.2)
Total Protein: 6.8 g/dL (ref 6.5–8.1)

## 2022-10-13 LAB — HEMOGLOBIN A1C
Hgb A1c MFr Bld: 6 % — ABNORMAL HIGH (ref 4.8–5.6)
Mean Plasma Glucose: 125.5 mg/dL

## 2022-10-13 LAB — TROPONIN I (HIGH SENSITIVITY)
Troponin I (High Sensitivity): 17 ng/L (ref <18)
Troponin I (High Sensitivity): 23 ng/L — ABNORMAL HIGH (ref <18)
Troponin I (High Sensitivity): 25 ng/L — ABNORMAL HIGH (ref <18)

## 2022-10-13 LAB — PROTIME-INR
INR: 1.3 — ABNORMAL HIGH (ref 0.8–1.2)
Prothrombin Time: 16.7 seconds — ABNORMAL HIGH (ref 11.4–15.2)

## 2022-10-13 LAB — BRAIN NATRIURETIC PEPTIDE: B Natriuretic Peptide: 159.5 pg/mL — ABNORMAL HIGH (ref 0.0–100.0)

## 2022-10-13 LAB — GLUCOSE, CAPILLARY: Glucose-Capillary: 112 mg/dL — ABNORMAL HIGH (ref 70–99)

## 2022-10-13 LAB — SARS CORONAVIRUS 2 BY RT PCR: SARS Coronavirus 2 by RT PCR: NEGATIVE

## 2022-10-13 LAB — CREATININE, SERUM
Creatinine, Ser: 6.31 mg/dL — ABNORMAL HIGH (ref 0.61–1.24)
GFR, Estimated: 9 mL/min — ABNORMAL LOW (ref >60)

## 2022-10-13 LAB — TECHNOLOGIST SMEAR REVIEW
Plt Morphology: NORMAL
WBC MORPHOLOGY: INCREASED

## 2022-10-13 LAB — MRSA NEXT GEN BY PCR, NASAL: MRSA by PCR Next Gen: NOT DETECTED

## 2022-10-13 LAB — TSH: TSH: 0.439 u[IU]/mL (ref 0.350–4.500)

## 2022-10-13 LAB — D-DIMER, QUANTITATIVE: D-Dimer, Quant: 11.94 ug{FEU}/mL — ABNORMAL HIGH (ref 0.00–0.50)

## 2022-10-13 LAB — LACTIC ACID, PLASMA: Lactic Acid, Venous: 5.4 mmol/L (ref 0.5–1.9)

## 2022-10-13 MED ORDER — CHLORHEXIDINE GLUCONATE CLOTH 2 % EX PADS
6.0000 | MEDICATED_PAD | Freq: Every day | CUTANEOUS | Status: DC
  Administered 2022-10-13 – 2022-10-26 (×9): 6 via TOPICAL

## 2022-10-13 MED ORDER — TRAMADOL HCL 50 MG PO TABS
50.0000 mg | ORAL_TABLET | Freq: Three times a day (TID) | ORAL | Status: DC | PRN
  Administered 2022-10-14 – 2022-10-28 (×12): 50 mg via ORAL
  Filled 2022-10-13 (×13): qty 1

## 2022-10-13 MED ORDER — ONDANSETRON HCL 4 MG/2ML IJ SOLN
4.0000 mg | Freq: Four times a day (QID) | INTRAMUSCULAR | Status: DC | PRN
  Administered 2022-10-14 – 2022-10-27 (×2): 4 mg via INTRAVENOUS
  Filled 2022-10-13: qty 2

## 2022-10-13 MED ORDER — FUROSEMIDE 10 MG/ML IJ SOLN: 60.0000 mg | INTRAMUSCULAR | Status: DC

## 2022-10-13 MED ORDER — SODIUM BICARBONATE 8.4 % IV SOLN
INTRAVENOUS | Status: DC
  Filled 2022-10-13: qty 150
  Filled 2022-10-13: qty 1000
  Filled 2022-10-13: qty 150

## 2022-10-13 MED ORDER — ORAL CARE MOUTH RINSE: 15.0000 mL | OROMUCOSAL | Status: DC | PRN

## 2022-10-13 MED ORDER — HEPARIN SODIUM (PORCINE) 5000 UNIT/ML IJ SOLN
5000.0000 [IU] | Freq: Three times a day (TID) | INTRAMUSCULAR | Status: DC
  Administered 2022-10-13 – 2022-10-14 (×2): 5000 [IU] via SUBCUTANEOUS
  Filled 2022-10-13 (×2): qty 1

## 2022-10-13 MED ORDER — ADULT MULTIVITAMIN W/MINERALS CH
1.0000 | ORAL_TABLET | Freq: Every day | ORAL | Status: DC
  Filled 2022-10-13: qty 1

## 2022-10-13 MED ORDER — NICOTINE 14 MG/24HR TD PT24
14.0000 mg | MEDICATED_PATCH | Freq: Every day | TRANSDERMAL | Status: DC
  Administered 2022-10-14 – 2022-11-04 (×21): 14 mg via TRANSDERMAL
  Filled 2022-10-13 (×23): qty 1

## 2022-10-13 MED ORDER — SODIUM CHLORIDE 0.9 % IV BOLUS
1000.0000 mL | Freq: Once | INTRAVENOUS | Status: AC
  Administered 2022-10-13: 1000 mL via INTRAVENOUS

## 2022-10-13 MED ORDER — POLYETHYLENE GLYCOL 3350 17 G PO PACK: 17.0000 g | PACK | Freq: Every day | ORAL | Status: DC | PRN

## 2022-10-13 MED ORDER — VITAMIN B-12 1000 MCG PO TABS
1000.0000 ug | ORAL_TABLET | Freq: Every day | ORAL | Status: DC
  Administered 2022-10-14 – 2022-11-04 (×21): 1000 ug via ORAL
  Filled 2022-10-13 (×23): qty 1

## 2022-10-13 MED ORDER — SODIUM BICARBONATE 8.4 % IV SOLN
INTRAVENOUS | Status: AC
  Administered 2022-10-13: 50 meq via INTRAVENOUS
  Filled 2022-10-13: qty 50

## 2022-10-13 MED ORDER — ONDANSETRON HCL 4 MG PO TABS: 4.0000 mg | ORAL_TABLET | Freq: Four times a day (QID) | ORAL | Status: DC | PRN

## 2022-10-13 MED ORDER — SODIUM BICARBONATE 8.4 % IV SOLN: 50.0000 meq | Freq: Once | INTRAVENOUS | Status: AC

## 2022-10-13 MED ORDER — ENSURE ENLIVE PO LIQD
237.0000 mL | Freq: Two times a day (BID) | ORAL | Status: DC
  Administered 2022-10-14: 237 mL via ORAL

## 2022-10-13 MED ORDER — SODIUM CHLORIDE 0.9 % IV SOLN: Freq: Once | INTRAVENOUS | Status: AC

## 2022-10-13 MED ORDER — LACTATED RINGERS IV SOLN: INTRAVENOUS | Status: DC

## 2022-10-13 NOTE — ED Provider Notes (Signed)
[facility] Provider Note    Event Date/Time   First MD Initiated Contact with Patient 10/13/22 1548     (approximate)  History   Chief Complaint: Weakness  HPI  Teja Arreola is a 72 y.o. male with a past medical history of arthritis, presents to the emergency department for generalized fatigue weakness and increased falls.  According to the daughter patient has been feeling very weak over the last 2 to 3 weeks and has had multiple falls over the past 1 week.  Intermittently complaining of some shortness of breath chest discomfort at times.  Denies any fever cough congestion.  Denies any chest pain to myself.  No abdominal pain.  No urinary symptoms.  Daughter states the patient has not been eating or drinking much recently either.  Physical Exam   Triage Vital Signs: ED Triage Vitals  Encounter Vitals Group     BP 10/13/22 1500 100/68     Systolic BP Percentile --      Diastolic BP Percentile --      Pulse Rate 10/13/22 1500 (!) 128     Resp 10/13/22 1500 (!) 24     Temp 10/13/22 1500 98.5 F (36.9 C)     Temp src --      SpO2 10/13/22 1500 98 %     Weight 10/13/22 1458 196 lb (88.9 kg)     Height 10/13/22 1458 5\' 9"  (1.753 m)     Head Circumference --      Peak Flow --      Pain Score 10/13/22 1458 10     Pain Loc --      Pain Education --      Exclude from Growth Chart --     Most recent vital signs: Vitals:   10/13/22 1500 10/13/22 1501  BP: 100/68   Pulse: (!) 128   Resp: 19 (!) 24  Temp: 98.5 F (36.9 C)   SpO2: 98%     General: Awake, no distress.  CV:  Good peripheral perfusion.  Regular rate and rhythm  Resp:  Normal effort.  Equal breath sounds bilaterally.  Abd:  No distention.  Soft, nontender.  No rebound or guarding.  Benign abdomen.  ED Results / Procedures / Treatments   EKG  EKG viewed and interpreted by myself shows sinus tachycardia 127 bpm with a narrow QRS, normal axis, normal intervals, nonspecific but  no concerning ST changes.  RADIOLOGY  I have reviewed and interpreted chest x-ray images.  No consolidation or pulmonary edema on my evaluation. Radiology's read the x-ray is negative   MEDICATIONS ORDERED IN ED: Medications  sodium chloride 0.9 % bolus 1,000 mL (has no administration in time range)     IMPRESSION / MDM / ASSESSMENT AND PLAN / ED COURSE  I reviewed the triage vital signs and the nursing notes.  Patient's presentation is most consistent with acute presentation with potential threat to life or bodily function.  Patient presents to the emergency department for worsening weakness fatigue multiple falls.  Given the multiple falls we will obtain CT imaging of the head as a precaution.  We will check labs including cardiac enzymes and continue to closely monitor.  Will IV hydrate while awaiting for the results.  Patient is tachycardic has somewhat dry appearing mucous membranes clinically consistent with dehydration.  Patient's labs have begun to result showing a negative troponin however on the patient's chemistry creatinine of 6.3 with no history of renal disease per  daughter.  1 month ago patient's creatinine was 1.7 and today is 6.3 likely explaining the patient's weakness and fatigue.  We will obtain a renal ultrasound urine sample and continue to closely monitor.  Patient CBC shows significant leukocytosis concerning for possible oncologic process.  I have added on a smear and differential.  Will admit to the hospital service for further workup and treatment.  FINAL CLINICAL IMPRESSION(S) / ED DIAGNOSES   Acute renal failure Weakness   Note:  This document was prepared using Dragon voice recognition software and may include unintentional dictation errors.   Minna Antis, MD 10/13/22 2337

## 2022-10-13 NOTE — Progress Notes (Signed)
2018 Family called out stating pt was experiencing SOB and Wheezing. Respiratory called to room as well as Rapid response team. Vital signs charted. Dr.Duncan notified and arrived to bedside.    2105 New orders placed. Sodium Bicarb started. Pt transferred to ICU bed 2. Report given to ICU RN.

## 2022-10-13 NOTE — Assessment & Plan Note (Addendum)
Worsening leukocytosis, initial smear review with bandemia.  Urine cultures with Klebsiella pneumonia but patient is being treated with appropriate antibiotics. Significantly elevated D-dimer which continued to get worse.  VTE has been ruled out with negative VQ scan and lower extremity venous Doppler. -Broadening of coverage by adding vancomycin-had bilateral knee wounds -Hematology consult.

## 2022-10-13 NOTE — Assessment & Plan Note (Addendum)
Patient with poor p.o. intake, BUN of 72 and creatinine of 6.30 on admission, it was 1.74 early July when he was first seen with fall. Renal ultrasound negative for any obstruction, noted an ill-defined echogenic area in the right upper pole of kidney which can be followed up later on with designated CT scan. Patient was also using NSAID regularly. -Nephrology consult-patient was started on dialysis yesterday, nephrology will monitor closely day by day to determine further need at this time -Monitor renal function -Avoid nephrotoxins

## 2022-10-13 NOTE — Assessment & Plan Note (Addendum)
Patient with history of recent decline, poor appetite and multiple falls.  First fall was on July 4 and since then he was falling couple of times a week.  All imaging negative for any acute fractures or injury. B12 normal, significantly low vitamin D-started on supplement. PT/OT are recommending SNF

## 2022-10-13 NOTE — ED Triage Notes (Signed)
Pt to ED for generalized weakness, multiple falls since July. Recent left knee injection.  Daughter reports pt has also been c/o chest pain and shob.

## 2022-10-13 NOTE — Assessment & Plan Note (Signed)
-  Nicotine patch 

## 2022-10-13 NOTE — H&P (Signed)
History and Physical    Patient: Joshua Mora UJW:[id number] DOB: Jul 06, 1950 DOA: 10/13/2022 DOS: the patient was seen and examined on 10/13/2022 PCP: Sandrea Hughs, NP  Patient coming from: Home  Chief Complaint:  Chief Complaint  Patient presents with   Weakness   HPI: Joshua Mora is a 72 y.o. male with medical history significant of osteoarthritis s/p left knee replacement came to ED with complaint of progressive weakness and frequent falls over the past 1 month.  Patient fell first time on July 4, he was again seen in ED on 826 when he fell on his left side and injured his left knee.  Imaging was negative for any acute abnormality.  Patient denies any recent illness, fever or chills, shortness of breath.  No urinary symptoms.  Poor p.o. intake over the past 1 to 2 weeks with poor appetite, no change in bowel habit.  No history of recent weight loss.  Patient was having left-sided lower chest to abdominal pain which increased with deep breathing and movement, started after the fall on 10/10/2022.  No shortness of breath or palpitations.  Patient was regularly using meloxicam and ibuprofen.  Patient smokes half a pack per day and occasionally drinks alcohol.  ED course and data reviewed.  Patient was mildly tachycardic on presentation.  Labs pertinent for significant leukocytosis at 46, BUN 72 and creatinine of 6.30, it was 1.74 on July 4 when he presented to ED with first fall. 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  No acute abnormality. CXR with no acute cardiopulmonary disease. Renal ultrasound with no obvious obstruction.  Noted to have an ill-defined echogenic area in the upper pole of right kidney, underlying lesion is not excluded.  Likely will need a follow-up imaging.  EKG, personally reviewed.  Sinus tachycardia with  rate of 127, QTc 465 and Q waves in inferior leads.  Patient was given a bolus of normal saline.   Review of Systems: As mentioned in the history of present illness. All other systems reviewed and are negative. Past Medical History:  Diagnosis Date   Arthritis    Chest pain    Past Surgical History:  Procedure Laterality Date   COLONOSCOPY WITH PROPOFOL N/A 09/12/2016   Procedure: COLONOSCOPY WITH PROPOFOL;  Surgeon: Scot Jun, MD;  Location: [facility] ENDOSCOPY;  Service: Endoscopy;  Laterality: N/A;   COLONOSCOPY WITH PROPOFOL N/A 04/02/2020   Procedure: COLONOSCOPY WITH PROPOFOL;  Surgeon: Pasty Spillers, MD;  Location: [facility] ENDOSCOPY;  Service: Endoscopy;  Laterality: N/A;   JOINT REPLACEMENT     LT TKA 1995   ROTATOR CUFF REPAIR Left 2013   SHOULDER ARTHROSCOPY WITH ROTATOR CUFF REPAIR Right 11/15/2017   Procedure: SHOULDER ARTHROSCOPY WITH ROTATOR CUFF REPAIR;  Surgeon: Lyndle Herrlich, MD;  Location: [facility] ORS;  Service: Orthopedics;  Laterality: Right;   Social History:  reports that he has been smoking cigarettes and cigars. He has never used smokeless tobacco. He reports current alcohol use. He reports that he does not use drugs.  No Known Allergies  History reviewed. No pertinent family history.  Prior to Admission medications   Medication Sig Start Date End Date Taking? Authorizing Provider  HYDROcodone-acetaminophen (NORCO/VICODIN) 5-325 MG tablet Take 1 tablet by mouth every 6 (six) hours as needed. 08/19/22   Darrick Grinder, PA-C  meclizine (ANTIVERT) 25 MG tablet Take 25 mg by mouth every  6 (six) hours as needed. 09/09/20   [provider]  meloxicam (MOBIC) 15 MG tablet Take 15 mg by mouth daily. 09/09/20   [provider]  VIAGRA 100 MG tablet SMARTSIG:1 Tablet(s) By Mouth 09/09/20   [provider]  vitamin B-12 (CYANOCOBALAMIN) 1000 MCG tablet Take 1,000 mcg by mouth daily. 09/09/20   [provider]    Physical  Exam: Vitals:   10/13/22 1725 10/13/22 1730 10/13/22 1800 10/13/22 1817  BP:  114/72 136/68 139/61  Pulse: 94 95 94 93  Resp: 16 (!) 21 (!) 24 20  Temp:   98.3 F (36.8 C) 97.7 F (36.5 C)  TempSrc:   Oral   SpO2: 96% 94% 92% 98%  Weight:      Height:       Vitals:   10/13/22 1725 10/13/22 1730 10/13/22 1800 10/13/22 1817  BP:  114/72 136/68 139/61  Pulse: 94 95 94 93  Resp: 16 (!) 21 (!) 24 20  Temp:   98.3 F (36.8 C) 97.7 F (36.5 C)  TempSrc:   Oral   SpO2: 96% 94% 92% 98%  Weight:      Height:       General: Vital signs reviewed.  Patient is well-developed and well-nourished, in no acute distress and cooperative with exam.  Head: Normocephalic and atraumatic. Eyes: EOMI, conjunctivae normal, no scleral icterus.  Neck: Supple, trachea midline, normal ROM,  Cardiovascular: RRR, S1 normal, S2 normal, no murmurs, gallops, or rubs. Pulmonary/Chest: Clear to auscultation bilaterally, no wheezes, rales, or rhonchi. Abdominal: Soft, non-tender, non-distended, BS +, Musculoskeletal: Left knee with soft brace, left ankle with significant bruising and swelling. Extremities: Bruising and edema of left ankle, no swelling on right. Neurological: A&O x3, Strength is normal and symmetric bilaterally, cranial nerve II-XII are grossly intact, no focal motor deficit, sensory intact to light touch bilaterally.  Psychiatric: Normal mood and affect.   Data Reviewed: Prior data reviewed as mentioned above  Assessment and Plan: * AKI (acute kidney injury) (HCC) Patient with poor p.o. intake, BUN of 72 and creatinine of 6.30, it was 1.74 early July when he was first seen with fall. Renal ultrasound negative for any obstruction, noted an ill-defined echogenic area in the right upper pole of kidney which can be followed up later on with designated CT scan. Patient was also using NSAID regularly. -Nephrology consult -Giving some IV fluid -Monitor renal function -Avoid  nephrotoxins  Leukocytosis Patient with no sign of infection, afebrile and no recent illnesses. Significant leukocytosis at 46.7, no history of underlying leukemia. Multiple recent falls. -Added blood smear -Check differential -Continue to monitor-might need hematology evaluation  Frequent falls Patient with history of recent decline, poor appetite and multiple falls.  First fall was on July 4 and since then he was falling couple of times a week.  Being evaluated in ED on 26 when he fell on his left side and injured his left knee and ankle and left knee imaging was negative at that time.  Edema and bruising of left ankle which was not imaged before. -Obtain left ankle imaging -Checking B12, vitamin D, TSH -PT/OT evaluation  Nicotine dependence, uncomplicated -Nicotine patch   Advance Care Planning:   Code Status: Full Code confirmed with patient and daughter  Consults: Nephrology  Family Communication: Discussed with daughter at bedside  Severity of Illness: The appropriate patient status for this patient is INPATIENT. Inpatient status is judged to be reasonable and necessary in order to provide the required  intensity of service to ensure the patient's safety. The patient's presenting symptoms, physical exam findings, and initial radiographic and laboratory data in the context of their chronic comorbidities is felt to place them at high risk for further clinical deterioration. Furthermore, it is not anticipated that the patient will be medically stable for discharge from the hospital within 2 midnights of admission.   * I certify that at the point of admission it is my clinical judgment that the patient will require inpatient hospital care spanning beyond 2 midnights from the point of admission due to high intensity of service, high risk for further deterioration and high frequency of surveillance required.*  This record has been created using Conservation officer, historic buildings. Errors  have been sought and corrected,but may not always be located. Such creation errors do not reflect on the standard of care.   Author: Arnetha Courser, MD 10/13/2022 6:20 PM  For on call review [email or web address].

## 2022-10-13 NOTE — Progress Notes (Signed)
Patient transferred to CCU s/p rapid response. Patient HR 130-140 bpm, RR 29s, 100% O2 sats on NRB. BP 121/69. Complete CHG done with incontinence care. Foam dressings applied to open abrasions. Bicarb gtt infusing at 125/hr. Family at the bedside, updated on POC.

## 2022-10-13 NOTE — Significant Event (Signed)
Rapid Response Event Note   Reason for Call :  Respiratory distress  Initial Focused Assessment:  Rapid response RN arrived in patient's room with patient in bed, with HOB elevated, surrounded by [facility] staff and patient's son. Patient with very elevated work of breathing, tachypnea with RR at least in 40s, difficult to accurately count since so fast, and wheezes. Restless with eyes wide and largely unfocused, though with verbal prompting patient could focus on staff or son. Only able to speak in short breathy one to two word answers and sometimes would not answer. Vital signs difficult to take due to patient's movements but HR 150s, BP 175/145, oxygen saturations when able to be read showed low low 90s but only rarely was it able to read even with multiple sites tried. Son reports no history of any respiratory issues and panic attacks.   Interventions:  RT placed patient on NRB mask due to difficulty obtaining oxygen saturations with patient's movements and work of breathing. Ordered Chest x-ray and ABG per rapid response order set. Dr. Para March at bedside and ordered 12 lead EKG, bladder scan, labs, and 60 mg of lasix to be obtained but not yet given. 12 lead EKG difficult to obtain due to patient movements. After ABG results obtained she ordered one amp of bicarb, which was given. As soon as bicarb amp given, patient's work of breathing decreased and patient reported feeling like it was easier to breathe.  After review of chest xray and labs including ABG, she ordered staff not to give lasix. Bladder scan showed only 177 mL in bladder. Bicarb infusion started per Dr. Lianne Bushy orders and transfer orders placed for stepdown by Dr. Para March.  Plan of Care:  Patient transferred to ICU 2. Ramone RN gave report to [employer] in ICU.  Event Summary:   MD Notified: Dr. Para March Call Time: 20:29 Arrival Time: 20:31 End Time: 21:21  Bennie Dallas, RN

## 2022-10-13 NOTE — Progress Notes (Addendum)
CROSS COVER NOTE  NAME: Joshua Mora MRN: [id number] DOB : [birth year]    Concern as stated by nurse / staff   pt experiencing SOB and tachypnea IV fluids stopped and Rapid response called. Respiratory at bedside      Patient assessment: -Upon my arrival, rapid response team, nursing and respiratory at bedside - Patient noted to be in acute respiratory distress with NRB on and respiratory attempting to get ABG - Spoke with bedside nurse, rapid response nurse, respiratory, son at bedside According to son at bedside and nursing, at change of shift patient was shaky and cold and having some shortness of breath and asked for warm blankets but then just prior to rapid response he became acutely short of breath, sitting upright on the bed struggling for air    10/13/2022    8:36 PM 10/13/2022    7:29 PM 10/13/2022    6:17 PM  Vitals with BMI  Systolic 180 131 027  Diastolic 145 70 61  Pulse 152 253 93    Review of Systems  Unable to perform ROS: Acuity of condition  Physical Exam Constitutional:      General: He is in acute distress.     Comments: Patient acutely short of breath, unable to speak through NRB.  Son at bedside trying to calm him down and comfort him  Cardiovascular:     Rate and Rhythm: Tachycardia present.  Pulmonary:     Effort: Tachypnea present.     Breath sounds: Normal breath sounds.  Abdominal:     Tenderness: There is no abdominal tenderness.  Musculoskeletal:     Right lower leg: No edema.     Left lower leg: No edema.  Neurological:     Mental Status: Mental status is at baseline.     Chart review done after patient assessment -Progress note reviewed, patient admitted with multiple falls and noted to have AKI and leukocytosis of 46,000 with no underlying leukemia - Renal ultrasound negative but showing right upper pole area - Son states that patient was taking multiple meds including NSAIDs and Tylenol today with left knee injury from a  recent fall --very little urine output since admission  Plan: Continue NRB ABG and Rainbow labs, including troponin, lactic acid, Pro-Cal, D-dimer and BNP Chest x-ray, EKG   Preliminary bedside results EKG: Sinus tachycardia with no acute ST-T wave elevations Chest x-ray personally viewed and interpreted pending formal read shows maybe mild pulmonary vascular congestion but no overt pulmonary edema Bladder scan: ABG ABG    Component Value Date/Time   PHART 7.24 (L) 10/13/2022 2038   PCO2ART 21 (L) 10/13/2022 2038   PO2ART 164 (H) 10/13/2022 2038   HCO3 9.0 (L) 10/13/2022 2038   ACIDBASEDEF 16.3 (H) 10/13/2022 2038   O2SAT 100 10/13/2022 2038   Assessment Plan: - Bicarb push     Initial Assessment and  Interventions   Assessment pending further workup:  Acute respiratory failure with hypoxia secondary to renal failure Acute metabolic acidosis secondary to AKI  Plan: Bicarb push followed by bicarb infusion Follow rainbow labs ordered Follow official radiology report Transfer patient to stepdown  Discussed with son at bedside and daughter over the phone about possible etiology of acute respiratory distress, transferred to stepdown and ongoing diagnostic evaluation.  They are in agreement with plan    Results Lactic Acid, Venous    Component Value Date/Time   LATICACIDVEN 5.4 (HH) 10/13/2022 2102  Latest Ref Rng & Units 10/13/2022    9:02 PM 10/13/2022    6:44 PM 10/13/2022    3:05 PM  BMP  Glucose 70 - 99 mg/dL 409   811   BUN 8 - 23 mg/dL 75   72   Creatinine 9.14 - 1.24 mg/dL 7.82  9.56  2.13   Sodium 135 - 145 mmol/L 144   138   Potassium 3.5 - 5.1 mmol/L 4.6   4.2   Chloride 98 - 111 mmol/L 111   116   CO2 22 - 32 mmol/L 15   10   Calcium 8.9 - 10.3 mg/dL 8.7   8.9    CBC    Component Value Date/Time   WBC 27.8 (H) 10/13/2022 2102   RBC 4.30 10/13/2022 2102   HGB 13.1 10/13/2022 2102   HCT 40.1 10/13/2022 2102   PLT 310 10/13/2022 2102    MCV 93.3 10/13/2022 2102   MCH 30.5 10/13/2022 2102   MCHC 32.7 10/13/2022 2102   RDW 15.9 (H) 10/13/2022 2102   LYMPHSABS 1.2 10/13/2022 2102   MONOABS 0.4 10/13/2022 2102   EOSABS 0.1 10/13/2022 2102   BASOSABS 0.2 (H) 10/13/2022 2102   D-dimer 12  CK 1448    Assessment and Plan 1.Suspect Uremia with oliguria/ AKI (NSAID, Rhabdo) 2.  High anion Gap metabolic acidosis 3. Acute respiratory failure secondary to uremia 4. Mild pulmonary edema on CXR 4. Elevated D dimer 5. Rhabdo, suspect trumatic from prior fall  Plan: -Will alert Renal to consider need for dialysis, -hydrate with IV bicarb in dextrose @125  -keep in stepdown overnight -monitor I and o -supplemental O2:  on 6 liters -Lasix 40mg  IV x 1 dose as a diuretic challenging while awaiting feedback from renal -Follow up LE venous ultrasound  -Discussed with Dr Wynelle Link who is in agreement with above plan and put patient on his list    CRITICAL CARE Performed by: Andris Baumann   Total critical care time: 120 minutes  Critical care time was exclusive of separately billable procedures and treating other patients.  Critical care was necessary to treat or prevent imminent or life-threatening deterioration.  Critical care was time spent personally by me on the following activities: development of treatment plan with patient and/or surrogate as well as nursing, discussions with consultants, evaluation of patient's response to treatment, examination of patient, obtaining history from patient or surrogate, ordering and performing treatments and interventions, ordering and review of laboratory studies, ordering and review of radiographic studies, pulse oximetry and re-evaluation of patient's condition.

## 2022-10-14 ENCOUNTER — Inpatient Hospital Stay: Payer: Medicare PPO

## 2022-10-14 ENCOUNTER — Inpatient Hospital Stay (HOSPITAL_COMMUNITY)
Admit: 2022-10-14 | Discharge: 2022-10-14 | Disposition: A | Discharge status: Home / Self Care | Payer: Medicare PPO | Attending: Internal Medicine | Admitting: Internal Medicine

## 2022-10-14 DIAGNOSIS — N179 Acute kidney failure, unspecified: Secondary | ICD-10-CM | POA: Diagnosis not present

## 2022-10-14 DIAGNOSIS — R7989 Other specified abnormal findings of blood chemistry: Secondary | ICD-10-CM | POA: Diagnosis not present

## 2022-10-14 DIAGNOSIS — A419 Sepsis, unspecified organism: Secondary | ICD-10-CM | POA: Diagnosis not present

## 2022-10-14 DIAGNOSIS — J9601 Acute respiratory failure with hypoxia: Secondary | ICD-10-CM | POA: Diagnosis not present

## 2022-10-14 DIAGNOSIS — R0602 Shortness of breath: Secondary | ICD-10-CM | POA: Diagnosis not present

## 2022-10-14 LAB — CK: Total CK: 1338 U/L — ABNORMAL HIGH (ref 49–397)

## 2022-10-14 LAB — LACTIC ACID, PLASMA
Lactic Acid, Venous: 2.7 mmol/L (ref 0.5–1.9)
Lactic Acid, Venous: 2.9 mmol/L (ref 0.5–1.9)
Lactic Acid, Venous: 3.1 mmol/L (ref 0.5–1.9)
Lactic Acid, Venous: 4.3 mmol/L (ref 0.5–1.9)

## 2022-10-14 LAB — URINALYSIS, W/ REFLEX TO CULTURE (INFECTION SUSPECTED)
Bilirubin Urine: NEGATIVE
Glucose, UA: NEGATIVE mg/dL
Ketones, ur: NEGATIVE mg/dL
Nitrite: POSITIVE — AB
Protein, ur: 100 mg/dL — AB
RBC / HPF: >50 RBC/hpf (ref 0–5)
Specific Gravity, Urine: 1.011 (ref 1.005–1.030)
Squamous Epithelial / HPF: NONE SEEN /HPF (ref 0–5)
WBC, UA: >50 WBC/hpf (ref 0–5)
pH: 6 (ref 5.0–8.0)

## 2022-10-14 LAB — ECHOCARDIOGRAM COMPLETE
AR max vel: 2.94 cm2
AV Area VTI: 3.3 cm2
AV Area mean vel: 3.49 cm2
AV Mean grad: 5 mmHg
AV Peak grad: 11.2 mmHg
Ao pk vel: 1.67 m/s
Area-P 1/2: 3.46 cm2
Height: 69 in
MV VTI: 4.06 cm2
S' Lateral: 2.9 cm
Weight: 2970.04 oz

## 2022-10-14 LAB — COMPREHENSIVE METABOLIC PANEL
ALT: 27 U/L (ref 0–44)
AST: 46 U/L — ABNORMAL HIGH (ref 15–41)
Albumin: 2.9 g/dL — ABNORMAL LOW (ref 3.5–5.0)
Alkaline Phosphatase: 35 U/L — ABNORMAL LOW (ref 38–126)
Anion gap: 20 — ABNORMAL HIGH (ref 5–15)
BUN: 77 mg/dL — ABNORMAL HIGH (ref 8–23)
CO2: 18 mmol/L — ABNORMAL LOW (ref 22–32)
Calcium: 9 mg/dL (ref 8.9–10.3)
Chloride: 106 mmol/L (ref 98–111)
Creatinine, Ser: 6.43 mg/dL — ABNORMAL HIGH (ref 0.61–1.24)
GFR, Estimated: 8 mL/min — ABNORMAL LOW (ref >60)
Glucose, Bld: 116 mg/dL — ABNORMAL HIGH (ref 70–99)
Potassium: 3.4 mmol/L — ABNORMAL LOW (ref 3.5–5.1)
Sodium: 144 mmol/L (ref 135–145)
Total Bilirubin: 0.5 mg/dL (ref 0.3–1.2)
Total Protein: 6.8 g/dL (ref 6.5–8.1)

## 2022-10-14 LAB — CBC
HCT: 34.3 % — ABNORMAL LOW (ref 39.0–52.0)
Hemoglobin: 11.9 g/dL — ABNORMAL LOW (ref 13.0–17.0)
MCH: 30.7 pg (ref 26.0–34.0)
MCHC: 34.7 g/dL (ref 30.0–36.0)
MCV: 88.6 fL (ref 80.0–100.0)
Platelets: 308 10*3/uL (ref 150–400)
RBC: 3.87 MIL/uL — ABNORMAL LOW (ref 4.22–5.81)
RDW: 15.8 % — ABNORMAL HIGH (ref 11.5–15.5)
WBC: 49.9 10*3/uL — ABNORMAL HIGH (ref 4.0–10.5)
nRBC: 0 % (ref 0.0–0.2)

## 2022-10-14 LAB — PROTEIN / CREATININE RATIO, URINE
Creatinine, Urine: 79 mg/dL
Protein Creatinine Ratio: 2.18 mg/mg{Cre} — ABNORMAL HIGH (ref 0.00–0.15)
Total Protein, Urine: 172 mg/dL

## 2022-10-14 LAB — LIPID PANEL
Cholesterol: 137 mg/dL (ref 0–200)
HDL: 17 mg/dL — ABNORMAL LOW (ref >40)
LDL Cholesterol: 92 mg/dL (ref 0–99)
Total CHOL/HDL Ratio: 8.1 ratio
Triglycerides: 140 mg/dL (ref <150)
VLDL: 28 mg/dL (ref 0–40)

## 2022-10-14 LAB — D-DIMER, QUANTITATIVE: D-Dimer, Quant: 13.47 ug{FEU}/mL — ABNORMAL HIGH (ref 0.00–0.50)

## 2022-10-14 LAB — VITAMIN D 25 HYDROXY (VIT D DEFICIENCY, FRACTURES): Vit D, 25-Hydroxy: 6.85 ng/mL — ABNORMAL LOW (ref 30–100)

## 2022-10-14 LAB — BLOOD GAS, ARTERIAL
Acid-base deficit: 1.5 mmol/L (ref 0.0–2.0)
Bicarbonate: 21.1 mmol/L (ref 20.0–28.0)
O2 Content: 5 L/min
O2 Saturation: 93.8 %
Patient temperature: 37
pCO2 arterial: 29 mmHg — ABNORMAL LOW (ref 32–48)
pH, Arterial: 7.47 — ABNORMAL HIGH (ref 7.35–7.45)
pO2, Arterial: 59 mmHg — ABNORMAL LOW (ref 83–108)

## 2022-10-14 LAB — GLUCOSE, CAPILLARY
Glucose-Capillary: 69 mg/dL — ABNORMAL LOW (ref 70–99)
Glucose-Capillary: 113 mg/dL — ABNORMAL HIGH (ref 70–99)

## 2022-10-14 LAB — PROCALCITONIN: Procalcitonin: 53.63 ng/mL

## 2022-10-14 LAB — NA AND K (SODIUM & POTASSIUM), RAND UR
Potassium Urine: 27 mmol/L
Sodium, Ur: 88 mmol/L

## 2022-10-14 LAB — TROPONIN I (HIGH SENSITIVITY): Troponin I (High Sensitivity): 39 ng/L — ABNORMAL HIGH (ref <18)

## 2022-10-14 LAB — VITAMIN B12: Vitamin B-12: 726 pg/mL (ref 180–914)

## 2022-10-14 LAB — PROTIME-INR
INR: 1.5 — ABNORMAL HIGH (ref 0.8–1.2)
Prothrombin Time: 18.1 seconds — ABNORMAL HIGH (ref 11.4–15.2)

## 2022-10-14 LAB — APTT: aPTT: 30 seconds (ref 24–36)

## 2022-10-14 LAB — CREATININE, URINE, RANDOM: Creatinine, Urine: 83 mg/dL

## 2022-10-14 LAB — HEPATITIS B SURFACE ANTIGEN: Hepatitis B Surface Ag: NONREACTIVE

## 2022-10-14 MED ORDER — FUROSEMIDE 10 MG/ML IJ SOLN
40.0000 mg | Freq: Once | INTRAMUSCULAR | Status: AC
  Administered 2022-10-14: 40 mg via INTRAVENOUS
  Filled 2022-10-14: qty 4

## 2022-10-14 MED ORDER — HEPARIN (PORCINE) 25000 UT/250ML-% IV SOLN
1950.0000 [IU]/h | INTRAVENOUS | Status: DC
  Administered 2022-10-14: 1350 [IU]/h via INTRAVENOUS
  Administered 2022-10-15: 1550 [IU]/h via INTRAVENOUS
  Administered 2022-10-16: 1750 [IU]/h via INTRAVENOUS
  Filled 2022-10-14 (×3): qty 250

## 2022-10-14 MED ORDER — LORAZEPAM 1 MG PO TABS: 1.0000 mg | ORAL_TABLET | ORAL | Status: AC | PRN

## 2022-10-14 MED ORDER — ACETAMINOPHEN 325 MG PO TABS
650.0000 mg | ORAL_TABLET | Freq: Four times a day (QID) | ORAL | Status: DC | PRN
  Administered 2022-10-14 – 2022-10-31 (×10): 650 mg via ORAL
  Filled 2022-10-14 (×10): qty 2

## 2022-10-14 MED ORDER — VITAMIN D (ERGOCALCIFEROL) 1.25 MG (50000 UNIT) PO CAPS
50000.0000 [IU] | ORAL_CAPSULE | ORAL | Status: DC
  Administered 2022-10-14: 50000 [IU] via ORAL
  Filled 2022-10-14 (×4): qty 1

## 2022-10-14 MED ORDER — THIAMINE HCL 100 MG/ML IJ SOLN
100.0000 mg | Freq: Every day | INTRAMUSCULAR | Status: DC
  Administered 2022-10-15 – 2022-10-21 (×2): 100 mg via INTRAVENOUS
  Filled 2022-10-14 (×6): qty 2

## 2022-10-14 MED ORDER — HEPARIN BOLUS VIA INFUSION
5050.0000 [IU] | Freq: Once | INTRAVENOUS | Status: AC
  Administered 2022-10-14: 5050 [IU] via INTRAVENOUS
  Filled 2022-10-14: qty 5050

## 2022-10-14 MED ORDER — ADULT MULTIVITAMIN W/MINERALS CH
1.0000 | ORAL_TABLET | Freq: Every day | ORAL | Status: DC
  Administered 2022-10-14 – 2022-10-24 (×11): 1 via ORAL
  Filled 2022-10-14 (×11): qty 1

## 2022-10-14 MED ORDER — SODIUM CHLORIDE 0.9 % IV SOLN
2.0000 g | INTRAVENOUS | Status: DC
  Administered 2022-10-14 – 2022-10-18 (×5): 2 g via INTRAVENOUS
  Filled 2022-10-14 (×6): qty 20

## 2022-10-14 MED ORDER — LACTATED RINGERS IV SOLN: INTRAVENOUS | Status: DC

## 2022-10-14 MED ORDER — THIAMINE MONONITRATE 100 MG PO TABS
100.0000 mg | ORAL_TABLET | Freq: Every day | ORAL | Status: DC
  Administered 2022-10-14 – 2022-11-04 (×19): 100 mg via ORAL
  Filled 2022-10-14 (×21): qty 1

## 2022-10-14 MED ORDER — VITAMIN C 500 MG PO TABS
500.0000 mg | ORAL_TABLET | Freq: Two times a day (BID) | ORAL | Status: DC
  Administered 2022-10-14 – 2022-11-04 (×38): 500 mg via ORAL
  Filled 2022-10-14 (×40): qty 1

## 2022-10-14 MED ORDER — LORAZEPAM 2 MG/ML IJ SOLN
1.0000 mg | INTRAMUSCULAR | Status: AC | PRN
  Administered 2022-10-14: 2 mg via INTRAVENOUS
  Administered 2022-10-15: 1 mg via INTRAVENOUS
  Filled 2022-10-14 (×4): qty 1

## 2022-10-14 MED ORDER — PERFLUTREN LIPID MICROSPHERE
1.0000 mL | INTRAVENOUS | Status: AC | PRN
  Administered 2022-10-14: 5 mL via INTRAVENOUS

## 2022-10-14 MED ORDER — BOOST / RESOURCE BREEZE PO LIQD CUSTOM
1.0000 | Freq: Three times a day (TID) | ORAL | Status: AC
  Administered 2022-10-14 (×2): 1 via ORAL

## 2022-10-14 MED ORDER — FOLIC ACID 1 MG PO TABS
1.0000 mg | ORAL_TABLET | Freq: Every day | ORAL | Status: DC
  Administered 2022-10-14 – 2022-11-04 (×21): 1 mg via ORAL
  Filled 2022-10-14 (×23): qty 1

## 2022-10-14 MED ORDER — MELATONIN 5 MG PO TABS
5.0000 mg | ORAL_TABLET | Freq: Every evening | ORAL | Status: DC | PRN
  Administered 2022-10-17 – 2022-10-18 (×2): 5 mg via ORAL
  Filled 2022-10-14 (×2): qty 1

## 2022-10-14 MED ORDER — PROCHLORPERAZINE EDISYLATE 10 MG/2ML IJ SOLN: 5.0000 mg | Freq: Four times a day (QID) | INTRAMUSCULAR | Status: DC | PRN

## 2022-10-14 MED ORDER — ENSURE ENLIVE PO LIQD
237.0000 mL | Freq: Three times a day (TID) | ORAL | Status: DC
  Administered 2022-10-15 – 2022-10-24 (×20): 237 mL via ORAL

## 2022-10-14 MED ORDER — POTASSIUM CHLORIDE CRYS ER 20 MEQ PO TBCR
40.0000 meq | EXTENDED_RELEASE_TABLET | Freq: Once | ORAL | Status: AC
  Administered 2022-10-14: 40 meq via ORAL
  Filled 2022-10-14: qty 2

## 2022-10-14 NOTE — Progress Notes (Signed)
CBG reading is 69, patient given orange juice.

## 2022-10-14 NOTE — Progress Notes (Signed)
Lab technicians attempted to draw all ordered labs, unfortunately all of the labs could not be drawn due to blood flow of vein stopping before completion.  Patient is requesting to not be stuck for labs any more today, but willing for attempts tomorrow.  Dr. Margo Aye notified.

## 2022-10-14 NOTE — TOC CM/SW Note (Signed)
Cm received TOC consult for SA education. Cm added SA information to AVS. Cm continue to follow for toc needs.

## 2022-10-14 NOTE — Progress Notes (Signed)
PT Cancellation Note  Patient Details Name: Joshua Mora MRN: [id number] DOB: March 05, 1950   Cancelled Treatment:    Reason Eval/Treat Not Completed: Other (comment): Nursing requested PT to be held this PM secondary to pt fatigue.  Will attempt to see pt at a future date/time as medically appropriate.    Ovidio Hanger PT, DPT 10/14/22, 4:23 PM

## 2022-10-14 NOTE — Progress Notes (Signed)
IV team at bedside, patient in agreement to having IV team place access.

## 2022-10-14 NOTE — Progress Notes (Signed)
PT Cancellation Note  Patient Details Name: Joshua Mora MRN: [id number] DOB: 23-May-1950   Cancelled Treatment:    Reason Eval/Treat Not Completed: Other (comment): Nursing requested PT evaluation to be held at this time secondary to patient actively vomiting.  Will attempt to see pt at a future date/time as medically appropriate.    Ovidio Hanger PT, DPT 10/14/22, 9:06 AM

## 2022-10-14 NOTE — Progress Notes (Signed)
Initial Nutrition Assessment  DOCUMENTATION CODES:   Not applicable  INTERVENTION:   Boost Breeze po TID, each supplement provides 250 kcal and 9 grams of protein  Ensure Enlive po TID, each supplement provides 350 kcal and 20 grams of protein.  MVI po daily   Ergocalciferol 50,000 units po weekly   Vitamin C 500mg  po BID   Pt at high refeed risk; recommend monitor potassium, magnesium and phosphorus labs daily until stable  Daily weights   NUTRITION DIAGNOSIS:   Inadequate oral intake related to poor appetite as evidenced by per patient/family report.  GOAL:   Patient will meet greater than or equal to 90% of their needs  MONITOR:   PO intake, Supplement acceptance, Labs, Weight trends, Skin, I & O's, Diet advancement  REASON FOR ASSESSMENT:   Malnutrition Screening Tool    ASSESSMENT:   72 y.o. male with medical history significant of osteoarthritis s/p left knee replacement who is admitted with weakness, frequent falls and AKI.  Met with pt and pt's son in room today. Pt reports poor appetite and oral intake at baseline. Son reports that patient has not eaten much at all over the past week. Son has been bringing pt food to his house but pt has been refusing to eat. Pt vomited after eating a small amount of breakfast this morning. Pt reports that he has not had any previous vomiting but does report weakness and falls since July. Of note, pt does have a severe vitamin D deficiency which can lead to weakness and falls. RD discussed with pt the importance of adequate nutrition needed to preserve lean muscle. Pt is willing to drink chocolate Ensure in hospital. RD will add supplements and vitamins to help pt meet his estimated. Pt is at high refeed risk. Per chart, pt is down 14lbs(7%) over the past month; this is significant weight loss.     Medications reviewed and include: B12, folic acid, heparin, MVI, nicotine, thiamine, ceftriaxone, LRS @100ml /hr  Labs reviewed: K  3.4(L), BUN 77(H), creat 6.43(H) Vitamin D- 6.85(L)- 8/30 Wbc- 49.9(H) Cbgs- 113, 69, 112 x 24 hrs   NUTRITION - FOCUSED PHYSICAL EXAM:  Flowsheet Row Most Recent Value  Orbital Region No depletion  Upper Arm Region No depletion  Thoracic and Lumbar Region No depletion  Buccal Region No depletion  Temple Region No depletion  Clavicle Bone Region No depletion  Clavicle and Acromion Bone Region No depletion  Scapular Bone Region No depletion  Dorsal Hand Mild depletion  Patellar Region Moderate depletion  Anterior Thigh Region Moderate depletion  Posterior Calf Region Moderate depletion  Edema (RD Assessment) Mild  Hair Reviewed  Eyes Reviewed  Mouth Reviewed  Skin Reviewed  Nails Reviewed   Diet Order:   Diet Order             Diet clear liquid Room service appropriate? Yes; Fluid consistency: Thin  Diet effective now                  EDUCATION NEEDS:   Education needs have been addressed  Skin:  Skin Assessment: Reviewed RN Assessment (ecchymosis)  Last BM:  8/30- TYPE 6  Height:   Ht Readings from Last 1 Encounters:  10/13/22 5\' 9"  (1.753 m)    Weight:   Wt Readings from Last 1 Encounters:  10/14/22 84.2 kg    Ideal Body Weight:  72.7 kg  BMI:  Body mass index is 27.41 kg/m.  Estimated Nutritional Needs:   Kcal:  1900-2200kcal/day  Protein:  95-110g/day  Fluid:  1.9-2.2L/day  Betsey Holiday MS, RD, LDN Please refer to [facility] for RD and/or RD on-call/weekend/after hours pager

## 2022-10-14 NOTE — Consult Note (Signed)
NAME:  Joshua Mora, MRN:  [id number], DOB:  April 26, 1950, LOS: 1 ADMISSION DATE:  10/13/2022, CONSULTATION DATE:  10/14/22 REFERRING MD:  Dr. Margo Aye, CHIEF COMPLAINT:  Weakness, frequent falls   Brief Pt Description / Synopsis:  72 y.o. male with PMHx most significant for alcohol abuse, tobacco abuse, prediabetes, CKD Stage IIIb, osteoarthritis s/p left knee replacement admitted with Severe Sepsis due to UTI, Acute Hypoxic Respiratory Failure due to pulmonary edema and presumed Pulmonary Embolism, along with Acute Kidney Injury.  History of Present Illness:  Joshua Mora is a 72 y.o. male with medical history significant of alcohol abuse, tobacco use disorder, diet controlled prediabetes, CKD 3B, osteoarthritis s/p left knee replacement who presented to [facility] ED on 10/13/22 with complaints of progressive weakness, poor PO intake, and frequent falls over the past 1 month.    He was evaluated in the ED on 8/23 after he fell on his left side and injured his left knee.   Since that time, he has developed left-sided chest pain, abdominal pain, and some shortness of breath.  He denies palpitations, cough, sputum production. He does endorse regular use of meloxicam and ibuprofen.  He also smokes 1/2 a pack per day and occasionally drinks alcohol.  ED Course: Initial Vital Signs: Temperature 98.5 F orally, pulse 128, respiratory rate 24, SpO2 98% on room air Significant Labs: Chloride 116, bicarbonate 10, BUN 72, creatinine 6.3, glucose 102, high-sensitivity troponin 17, WBC 45.3 with neutrophilia and left shift COVID-19 PCR negative Urinalysis positive for UTI Imaging Chest X-ray>>IMPRESSION: No acute cardiopulmonary disease. Renal US>>IMPRESSION: No collecting system dilatation. Ill-defined echogenic area in the upper pole of the right kidney measuring 15 mm. Underlying lesion is not excluded. Dedicated cross-sectional imaging study can be performed when clinically appropriate to  confirm presence or absence of the lesion CT Head w/o contrast>>IMPRESSION: 1. Generalized cerebral atrophy with chronic white matter small vessel ischemic changes. 2. 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 3. No acute intracranial abnormality. Left ankle Xray>>IMPRESSION: No acute abnormality seen. Medications Administered: 1L NS bolus  Hospitalists were asked to admit for further workup and treatment.  Please see "Significant Hospital Events" section below for full detailed hospital course.  Pertinent  Medical History   Past Medical History:  Diagnosis Date   Arthritis    Chest pain     Micro Data:  8/29: SARS-CoV-2 PCR>> negative 8/29: MRSA PCR>>negative 8/30: Urine>> 8/30: Blood cultures>> have not been collected yet due to poor venous access  Antimicrobials:   Anti-infectives (From admission, onward)    Start     Dose/Rate Route Frequency Ordered Stop   10/14/22 1400  cefTRIAXone (ROCEPHIN) 2 g in sodium chloride 0.9 % 100 mL IVPB        2 g 200 mL/hr over 30 Minutes Intravenous Every 24 hours 10/14/22 1308         Significant Hospital Events: Including procedures, antibiotic start and stop dates in addition to other pertinent events   8/29: Admitted by [facility].  Nephrology consulted for AKI.  Later in the evening, Rapid response called due to Acute Respiratory Distress, transferred to [facility]. 8/30: Found to have D-dimer of 13.4, started on Heparin gtt for presumed PE.  Difficulty obtaining lab draws and limited perihperal IV access.  PCCM asked to consult due to high risk for decompensation and to place Trialysis cathter.  Interim History / Subjective:  -Pt sitting in bed, daughter at bedside -On 4L [place], SBP ranging from  105 - 120, mild tachycardia (HR 102-110), in no acute distress -Discussed indication for Trialysis placement ~ pt and daughter in agreement and consent to placement  Objective   Blood pressure (!)  105/53, pulse 96, temperature 98.6 F (37 C), temperature source Oral, resp. rate (!) 30, height 5\' 9"  (1.753 m), weight 84.2 kg, SpO2 91%.        Intake/Output Summary (Last 24 hours) at 10/14/2022 1834 Last data filed at 10/14/2022 1725 Gross per 24 hour  Intake 1611.1 ml  Output 431 ml  Net 1180.1 ml   Filed Weights   10/13/22 1458 10/13/22 2121 10/14/22 0500  Weight: 88.9 kg 83.3 kg 84.2 kg    Examination: General: Acutely ill-appearing male, sitting in bed, on 4 L nasal cannula, no acute distress HENT: Atraumatic, normocephalic, neck supple, no JVD Lungs: Faint rales at bilateral bases, no wheezing, even, mild tachypnea Cardiovascular: Tachycardia, regular rhythm, S1-S2, no murmurs, rubs, gallops Abdomen: Soft, nontender, nondistended, no guarding or rebound tenderness, bowel sounds positive x 4 Extremities: Left upper extremity edema Neuro: Awake and alert, slightly confused, moves all extremities, no focal deficits GU: Deferred  Resolved Hospital Problem list     Assessment & Plan:   #Tachycardia in setting of Sepsis and presumed Pulmonary Embolism #Elevated Troponin, suspect demand ischemia Echocardiogram 10/14/22: LVEF 60-65%, grade I DD, RV systolic function normal, RV size is normal, mild aortic regurgitation -Continuous cardiac monitoring -Maintain MAP >65 -IV fluids -Vasopressors as needed to maintain MAP goal ~ not requiring currently -Trend lactic acid until normalized -Trend HS Troponin until peaked -Echocardiogram pending -Diuresis as BP and renal function permits -Continue Heparin gtt -Venous US of the bilateral LE and LUE all negative for DVT  #Severe Sepsis due to UTI (met SIRS Criteria on presentation: pulse 128, respiratory rate 24, WBC 40's) #Significant Leukocytosis, ? Leukemoid reaction No history  of underlying Leukemia -Monitor fever curve -Trend WBC's & Procalcitonin -Follow cultures as above -Continue empiric Ceftriaxone pending cultures &  sensitivities -Consider Hematology consult, will defter to primary service  #Acute Hypoxic Respiratory Failure in the setting of Pulmonary Edema, presumed PE, & multiple metabolic derangements -Supplemental O2 as needed to maintain O2 sats >92% -BiPAP if needed -Follow intermittent Chest X-ray & ABG as needed -Bronchodilators prn -Diuresis as BP and renal function permits -Pulmonary toilet as able -Continue Heparin gtt  #Acute Kidney Injury on CKD Stage IIIb, suspect due to NSAIDS Baseline creatinine 1.74 & GFR 41 on 08/18/22 #Mild Hypokalemia #AG Metabolic Acidosis in setting of AKI and Lactic acidosis -Monitor I&O's / urinary output -Follow BMP -Ensure adequate renal perfusion -Avoid nephrotoxic agents as able -Replace electrolytes as indicated ~ Pharmacy following for assistance with electrolyte replacement -IV fluids -Nephrology following, appreciate input ~ anticipate low threshold for starting Renal Replacement Therapy, has asked PCCM to place temporary dialysis access  #Acute Metabolic Encephalopathy #High risk for development of Alcohol Withdrawal PMHx: CVA -Treatment of metabolic derangements & sepsis as outlined above -Provide supportive care -Promote normal sleep/wake cycle and family presence -Avoid sedating medications as able -CIWA protocol -Thiamine, Folic acid, MVI    Best Practice (right click and "Reselect all SmartList Selections" daily)   Diet/type: Regular consistency (see orders) DVT prophylaxis: systemic heparin GI prophylaxis: N/A Lines: N/A Foley:  N/A Code Status:  full code Last date of multidisciplinary goals of care discussion [N/A]  Labs   CBC: Recent Labs  Lab 10/13/22 1555 10/13/22 1844 10/13/22 2102 10/14/22 0234  WBC 46.7* 45.3* 27.8* 49.9*  NEUTROABS  --  39.3* 25.6*  --   HGB 13.5 13.2 13.1 11.9*  HCT 40.4 40.1 40.1 34.3*  MCV 92.4 91.8 93.3 88.6  PLT 384 371 310 308    Basic Metabolic Panel: Recent Labs  Lab  10/13/22 1505 10/13/22 1844 10/13/22 2102 10/14/22 0234  NA 138  --  144 144  K 4.2  --  4.6 3.4*  CL 116*  --  111 106  CO2 10*  --  15* 18*  GLUCOSE 102*  --  106* 116*  BUN 72*  --  75* 77*  CREATININE 6.30* 6.31* 6.55* 6.43*  CALCIUM 8.9  --  8.7* 9.0   GFR: Estimated Creatinine Clearance: 10.1 mL/min (A) (by C-G formula based on SCr of 6.43 mg/dL (H)). Recent Labs  Lab 10/13/22 1555 10/13/22 1844 10/13/22 2102 10/13/22 2102 10/14/22 0006 10/14/22 0234 10/14/22 0713 10/14/22 1039 10/14/22 1309  PROCALCITON  --   --   --   --   --   --   --  53.63  --   WBC 46.7* 45.3* 27.8*  --   --  49.9*  --   --   --   LATICACIDVEN  --   --  5.4*   < > 2.9* 2.7* 3.1*  --  4.3*   < > = values in this interval not displayed.    Liver Function Tests: Recent Labs  Lab 10/13/22 2102 10/14/22 0234  AST 48* 46*  ALT 27 27  ALKPHOS 41 35*  BILITOT 0.8 0.5  PROT 6.8 6.8  ALBUMIN 3.1* 2.9*   No results for input(s): "LIPASE", "AMYLASE" in the last 168 hours. No results for input(s): "AMMONIA" in the last 168 hours.  ABG    Component Value Date/Time   PHART 7.47 (H) 10/14/2022 1526   PCO2ART 29 (L) 10/14/2022 1526   PO2ART 59 (L) 10/14/2022 1526   HCO3 21.1 10/14/2022 1526   ACIDBASEDEF 1.5 10/14/2022 1526   O2SAT 93.8 10/14/2022 1526     Coagulation Profile: Recent Labs  Lab 10/13/22 2102 10/14/22 1601  INR 1.3* 1.5*    Cardiac Enzymes: Recent Labs  Lab 10/14/22 0006  CKTOTAL 1,338*    HbA1C: Hgb A1c MFr Bld  Date/Time Value Ref Range Status  10/13/2022 06:44 PM 6.0 (H) 4.8 - 5.6 % Final    Comment:    (NOTE) Pre diabetes:          5.7%-6.4%  Diabetes:              >6.4%  Glycemic control for   <7.0% adults with diabetes     CBG: Recent Labs  Lab 10/13/22 2118 10/14/22 0806 10/14/22 1212  GLUCAP 112* 69* 113*    Review of Systems:   Positives in BOLD: Gen: Denies fever, chills, weight change, fatigue/weakness, night sweats HEENT: Denies  blurred vision, double vision, hearing loss, tinnitus, sinus congestion, rhinorrhea, sore throat, neck stiffness, dysphagia PULM: Denies shortness of breath, cough, sputum production, hemoptysis, wheezing CV: Denies chest pain, edema, orthopnea, paroxysmal nocturnal dyspnea, palpitations GI: Denies abdominal pain, nausea, vomiting, diarrhea, hematochezia, melena, constipation, change in bowel habits GU: Denies dysuria, hematuria, polyuria, oliguria, urethral discharge Endocrine: Denies hot or cold intolerance, polyuria, polyphagia or appetite change Derm: Denies rash, dry skin, scaling or peeling skin change Heme: Denies easy bruising, bleeding, bleeding gums Neuro: Denies headache, numbness, weakness, slurred speech, loss of memory or consciousness   Past Medical History:  He,  has a past medical history of Arthritis and Chest pain.  Surgical History:   Past Surgical History:  Procedure Laterality Date   COLONOSCOPY WITH PROPOFOL N/A 09/12/2016   Procedure: COLONOSCOPY WITH PROPOFOL;  Surgeon: Scot Jun, MD;  Location: [facility] ENDOSCOPY;  Service: Endoscopy;  Laterality: N/A;   COLONOSCOPY WITH PROPOFOL N/A 04/02/2020   Procedure: COLONOSCOPY WITH PROPOFOL;  Surgeon: Pasty Spillers, MD;  Location: [facility] ENDOSCOPY;  Service: Endoscopy;  Laterality: N/A;   JOINT REPLACEMENT     LT TKA 1995   ROTATOR CUFF REPAIR Left 2013   SHOULDER ARTHROSCOPY WITH ROTATOR CUFF REPAIR Right 11/15/2017   Procedure: SHOULDER ARTHROSCOPY WITH ROTATOR CUFF REPAIR;  Surgeon: Lyndle Herrlich, MD;  Location: [facility] ORS;  Service: Orthopedics;  Laterality: Right;     Social History:   reports that he has been smoking cigarettes and cigars. He has never used smokeless tobacco. He reports current alcohol use. He reports that he does not use drugs.   Family History:  His family history is not on file.   Allergies No Known Allergies   Home Medications  Prior to Admission medications   Medication Sig  Start Date End Date Taking? Authorizing Provider  HYDROcodone-acetaminophen (NORCO/VICODIN) 5-325 MG tablet Take 1 tablet by mouth every 6 (six) hours as needed. 08/19/22   Darrick Grinder, PA-C  meclizine (ANTIVERT) 25 MG tablet Take 25 mg by mouth every 6 (six) hours as needed. 09/09/20   [provider]  meloxicam (MOBIC) 15 MG tablet Take 15 mg by mouth daily. 09/09/20   [provider]  VIAGRA 100 MG tablet SMARTSIG:1 Tablet(s) By Mouth 09/09/20   [provider]  vitamin B-12 (CYANOCOBALAMIN) 1000 MCG tablet Take 1,000 mcg by mouth daily. 09/09/20   [provider]     Care time: 55 minutes     Harlon Ditty, AGACNP-BC [facility] Prefer epic messenger for cross cover needs If after hours, please call E-link

## 2022-10-14 NOTE — Progress Notes (Signed)
ICU NP at patient's bedside and explained to patient and daughter the risk and benefits of a trialysis catheter, the need for it, and answered all questions.  Patient and daughter in agreement for placement.  Consent obtained by Harlon Ditty, NP

## 2022-10-14 NOTE — Progress Notes (Addendum)
Patient's IV site to the left hand was noted to be swollen.  Sodium bicarb stopped and PIV removed.  Son, Hurston Prettyman, at bedside and aware.  Dr. Daleen Squibb notified, came to bedside and assessed patient.  Pharmacy was notified as well.  Left arm is elevated.  Will continue to monitor.  Sodium bicarb discontinued by Dr. Ronn Melena.

## 2022-10-14 NOTE — Consult Note (Signed)
[facility]  CONSULT NOTE    Date: 10/14/2022                  Patient Name:  Joshua Mora  MRN: [id number]  DOB: 21-Feb-1950  Age / Sex: 72 y.o., male         PCP: Sandrea Hughs, NP                 Service Requesting Consult: Dr. Margo Aye                 Reason for Consult: Acute Kidney Injury            History of Present Illness: Mr. Gabel Ramierz admitted to [facility] with worsening weakness. Patient's son is at bedside who assists with history taking. Patient reports taking ibuprofen 800mg  and meloxicam several times a day for his knee pains.   Patient reports poor PO intake for last few weeks. Patient continues to smoke cigarettes.   Patient found to have a 6.55. Patient was given sodium bicarbonate infusion. However rapid response called last night around 2200 for shortness of breath. Patient was given IV furosemide. Only of urine output afterwards.   Nephrology consulted for acute kidney injury with acute metabolic acidosis and anuria.   Patient's main complaint is upper extremity edema that he attributes to the IV infusions.    Medications: Outpatient medications: Medications Prior to Admission  Medication Sig Dispense Refill Last Dose   HYDROcodone-acetaminophen (NORCO/VICODIN) 5-325 MG tablet Take 1 tablet by mouth every 6 (six) hours as needed. 10 tablet 0    meclizine (ANTIVERT) 25 MG tablet Take 25 mg by mouth every 6 (six) hours as needed.      meloxicam (MOBIC) 15 MG tablet Take 15 mg by mouth daily.      VIAGRA 100 MG tablet SMARTSIG:1 Tablet(s) By Mouth      vitamin B-12 (CYANOCOBALAMIN) 1000 MCG tablet Take 1,000 mcg by mouth daily.       Current medications: Current Facility-Administered Medications  Medication Dose Route Frequency Provider Last Rate Last Admin   Chlorhexidine Gluconate Cloth 2 % PADS 6 each  6 each Topical Daily Arnetha Courser, MD   6 each at 10/14/22 1030   cyanocobalamin (VITAMIN B12) tablet 1,000  mcg  1,000 mcg Oral Daily Arnetha Courser, MD   1,000 mcg at 10/14/22 0831   feeding supplement (ENSURE ENLIVE / ENSURE PLUS) liquid 237 mL  237 mL Oral BID BM Arnetha Courser, MD   237 mL at 10/14/22 0831   folic acid (FOLVITE) tablet 1 mg  1 mg Oral Daily Dow Adolph N, DO   1 mg at 10/14/22 0830   furosemide (LASIX) injection 60 mg  60 mg Intravenous STAT Andris Baumann, MD       heparin injection 5,000 Units  5,000 Units Subcutaneous Q8H Arnetha Courser, MD   5,000 Units at 10/14/22 0551   lactated ringers infusion   Intravenous Continuous Arnetha Courser, MD       LORazepam (ATIVAN) tablet 1-4 mg  1-4 mg Oral Q1H PRN Darlin Drop, DO       Or   LORazepam (ATIVAN) injection 1-4 mg  1-4 mg Intravenous Q1H PRN Dow Adolph N, DO       multivitamin with minerals tablet 1 tablet  1 tablet Oral Daily Dow Adolph N, DO   1 tablet at 10/14/22 7253   nicotine (NICODERM CQ - dosed in mg/24 hours) patch 14 mg  14 mg Transdermal Daily Arnetha Courser, MD   14 mg at 10/14/22 0830   ondansetron (ZOFRAN) tablet 4 mg  4 mg Oral Q6H PRN Arnetha Courser, MD       Or   ondansetron (ZOFRAN) injection 4 mg  4 mg Intravenous Q6H PRN Arnetha Courser, MD   4 mg at 10/14/22 6387   Oral care mouth rinse  15 mL Mouth Rinse PRN Arnetha Courser, MD       polyethylene glycol (MIRALAX / GLYCOLAX) packet 17 g  17 g Oral Daily PRN Arnetha Courser, MD       thiamine (VITAMIN B1) tablet 100 mg  100 mg Oral Daily Dow Adolph N, DO   100 mg at 10/14/22 0830   Or   thiamine (VITAMIN B1) injection 100 mg  100 mg Intravenous Daily Hall, Carole N, DO       traMADol (ULTRAM) tablet 50 mg  50 mg Oral Q8H PRN Arnetha Courser, MD          Allergies: No Known Allergies    Past Medical History: Past Medical History:  Diagnosis Date   Arthritis    Chest pain      Past Surgical History: Past Surgical History:  Procedure Laterality Date   COLONOSCOPY WITH PROPOFOL N/A 09/12/2016   Procedure: COLONOSCOPY WITH PROPOFOL;  Surgeon: Scot Jun, MD;  Location: [facility] ENDOSCOPY;  Service: Endoscopy;  Laterality: N/A;   COLONOSCOPY WITH PROPOFOL N/A 04/02/2020   Procedure: COLONOSCOPY WITH PROPOFOL;  Surgeon: Pasty Spillers, MD;  Location: [facility] ENDOSCOPY;  Service: Endoscopy;  Laterality: N/A;   JOINT REPLACEMENT     LT TKA 1995   ROTATOR CUFF REPAIR Left 2013   SHOULDER ARTHROSCOPY WITH ROTATOR CUFF REPAIR Right 11/15/2017   Procedure: SHOULDER ARTHROSCOPY WITH ROTATOR CUFF REPAIR;  Surgeon: Lyndle Herrlich, MD;  Location: [facility] ORS;  Service: Orthopedics;  Laterality: Right;     Family History: History reviewed. No pertinent family history.   Social History: Social History   Socioeconomic History   Marital status: Single    Spouse name: Not on file   Number of children: Not on file   Years of education: Not on file   Highest education level: Not on file  Occupational History   Not on file  Tobacco Use   Smoking status: Every Day    Types: Cigarettes, Cigars   Smokeless tobacco: Never   Tobacco comments:    5 per day  Vaping Use   Vaping status: Never Used  Substance and Sexual Activity   Alcohol use: Yes    Comment: occ   Drug use: No   Sexual activity: Not on file  Other Topics Concern   Not on file  Social History Narrative   Not on file   Social Determinants of Health   Financial Resource Strain: Not on file  Food Insecurity: No Food Insecurity (10/13/2022)   Hunger Vital Sign    Worried About Running Out of Food in the Last Year: Never true    Ran Out of Food in the Last Year: Never true  Transportation Needs: No Transportation Needs (10/13/2022)   PRAPARE - Administrator, Civil Service (Medical): No    Lack of Transportation (Non-Medical): No  Physical Activity: Not on file  Stress: Not on file  Social Connections: Not on file  Intimate Partner Violence: Patient Unable To Answer (10/13/2022)   Humiliation, Afraid, Rape, and Kick questionnaire    Fear of Current or  Ex-Partner: Patient unable to answer    Emotionally Abused: Patient unable to answer    Physically Abused: Patient unable to answer    Sexually Abused: Patient unable to answer     Vital Signs: Blood pressure (!) 134/59, pulse 60, temperature 99.2 F (37.3 C), temperature source Oral, resp. rate (!) 29, height 5\' 9"  (1.753 m), weight 84.2 kg, SpO2 97%.  Weight trends: Filed Weights   10/13/22 1458 10/13/22 2121 10/14/22 0500  Weight: 88.9 kg 83.3 kg 84.2 kg    Physical Exam: General: NAD, sitting up in bed  Head: Normocephalic, atraumatic. Moist oral mucosal membranes  Eyes: Anicteric, PERRL  Neck: Supple, trachea midline  Lungs:  Clear to auscultation  Heart: Tachycardia.  Abdomen:  Soft, nontender  Extremities:  Left upper extremity with 1+ edema. No lower extremity edema   Neurologic: Nonfocal, moving all four extremities  Skin: No lesions  Access: none     Lab results: Basic Metabolic Panel: Recent Labs  Lab 10/13/22 1505 10/13/22 1844 10/13/22 2102 10/14/22 0234  NA 138  --  144 144  K 4.2  --  4.6 3.4*  CL 116*  --  111 106  CO2 10*  --  15* 18*  GLUCOSE 102*  --  106* 116*  BUN 72*  --  75* 77*  CREATININE 6.30* 6.31* 6.55* 6.43*  CALCIUM 8.9  --  8.7* 9.0    Liver Function Tests: Recent Labs  Lab 10/13/22 2102 10/14/22 0234  AST 48* 46*  ALT 27 27  ALKPHOS 41 35*  BILITOT 0.8 0.5  PROT 6.8 6.8  ALBUMIN 3.1* 2.9*   No results for input(s): "LIPASE", "AMYLASE" in the last 168 hours. No results for input(s): "AMMONIA" in the last 168 hours.  CBC: Recent Labs  Lab 10/13/22 1555 10/13/22 1844 10/13/22 2102 10/14/22 0234  WBC 46.7* 45.3* 27.8* 49.9*  NEUTROABS  --  39.3* 25.6*  --   HGB 13.5 13.2 13.1 11.9*  HCT 40.4 40.1 40.1 34.3*  MCV 92.4 91.8 93.3 88.6  PLT 384 371 310 308    Cardiac Enzymes: Recent Labs  Lab 10/14/22 0006  CKTOTAL 1,338*    BNP: Invalid input(s): "POCBNP"  CBG: Recent Labs  Lab 10/13/22 2118  10/14/22 0806 10/14/22 1212  GLUCAP 112* 69* 113*    Microbiology: Results for orders placed or performed during the hospital encounter of 10/13/22  SARS Coronavirus 2 by RT PCR (hospital order, performed in [facility] hospital lab) *cepheid single result test* Anterior Nasal Swab     Status: None   Collection Time: 10/13/22  3:55 PM   Specimen: Anterior Nasal Swab  Result Value Ref Range Status   SARS Coronavirus 2 by RT PCR NEGATIVE NEGATIVE Final    Comment: (NOTE) SARS-CoV-2 target nucleic acids are NOT DETECTED.  The SARS-CoV-2 RNA is generally detectable in upper and lower respiratory specimens during the acute phase of infection. The lowest concentration of SARS-CoV-2 viral copies this assay can detect is 250 copies / mL. A negative result does not preclude SARS-CoV-2 infection and should not be used as the sole basis for treatment or other patient management decisions.  A negative result may occur with improper specimen collection / handling, submission of specimen other than nasopharyngeal swab, presence of viral mutation(s) within the areas targeted by this assay, and inadequate number of viral copies (<250 copies / mL). A negative result must be combined with clinical observations, patient history, and epidemiological information.  Fact Sheet for Patients:  RoadLapTop.co.za  Fact Sheet for Healthcare Providers: http://kim-miller.com/  This test is not yet approved or  cleared by the [place] FDA and has been authorized for detection and/or diagnosis of SARS-CoV-2 by FDA under an Emergency Use Authorization (EUA).  This EUA will remain in effect (meaning this test can be used) for the duration of the COVID-19 declaration under Section 564(b)(1) of the Act, 21 U.S.C. section 360bbb-3(b)(1), unless the authorization is terminated or revoked sooner.  Performed at [facility], [address]  [place], [place] [address]   MRSA Next Gen by PCR, Nasal     Status: None   Collection Time: 10/13/22  9:51 PM   Specimen: Nasal Mucosa; Nasal Swab  Result Value Ref Range Status   MRSA by PCR Next Gen NOT DETECTED NOT DETECTED Final    Comment: (NOTE) The GeneXpert MRSA Assay (FDA approved for NASAL specimens only), is one component of a comprehensive MRSA colonization surveillance program. It is not intended to diagnose MRSA infection nor to guide or monitor treatment for MRSA infections. Test performance is not FDA approved in patients less than 57 years old. Performed at [facility], [address] [place], [place] [address]     Coagulation Studies: Recent Labs    10/13/22 2100/11/05  LABPROT 16.7*  INR 1.3*    Urinalysis: Recent Labs    10/14/22 0732  COLORURINE YELLOW*  LABSPEC 1.011  PHURINE 6.0  GLUCOSEU NEGATIVE  HGBUR LARGE*  BILIRUBINUR NEGATIVE  KETONESUR NEGATIVE  PROTEINUR 100*  NITRITE POSITIVE*  LEUKOCYTESUR LARGE*      Imaging: US Venous Img Lower Bilateral (DVT)  Result Date: 10/14/2022 CLINICAL DATA:  10026 Shortness of breath 10026 EXAM: BILATERAL LOWER EXTREMITY VENOUS DOPPLER ULTRASOUND TECHNIQUE: Gray-scale sonography with graded compression, as well as color Doppler and duplex ultrasound were performed to evaluate the lower extremity deep venous systems from the level of the common femoral vein and including the common femoral, femoral, profunda femoral, popliteal and calf veins including the posterior tibial, peroneal and gastrocnemius veins when visible. The superficial great saphenous vein was also interrogated. Spectral Doppler was utilized to evaluate flow at rest and with distal augmentation maneuvers in the common femoral, femoral and popliteal veins. COMPARISON:  Lower extremity XRs, 10/13/2022. FINDINGS: RIGHT LOWER EXTREMITY VENOUS Normal compressibility of the RIGHT common femoral, superficial femoral, and popliteal veins, as well as  the visualized calf veins. Visualized portions of profunda femoral vein and great saphenous vein unremarkable. No filling defects to suggest DVT on grayscale or color Doppler imaging. Doppler waveforms show normal direction of venous flow, normal respiratory plasticity and response to augmentation. OTHER No evidence of superficial thrombophlebitis or abnormal fluid collection. Limitations: none LEFT LOWER EXTREMITY VENOUS Normal compressibility of the LEFT common femoral, superficial femoral, and popliteal veins, as well as the visualized calf veins. Visualized portions of profunda femoral vein and great saphenous vein unremarkable. No filling defects to suggest DVT on grayscale or color Doppler imaging. Doppler waveforms show normal direction of venous flow, normal respiratory plasticity and response to augmentation. OTHER No evidence of superficial thrombophlebitis or abnormal fluid collection. Limitations: none IMPRESSION: No evidence of femoropopliteal DVT or superficial thrombophlebitis within either lower extremity. Roanna Banning, MD [facility] Radiology Electronically Signed   By: Roanna Banning M.D.   On: 10/14/2022 06:21   DG Chest Port 1 View  Result Date: 10/13/2022 CLINICAL DATA:  29562 Acute respiratory distress 66502 EXAM: PORTABLE CHEST 1 VIEW.  Patient is  rotated. COMPARISON:  Chest x-ray 10/13/2022, chest x-ray report 05/15/2017 FINDINGS: Slightly more prominent thoracic aorta silhouette likely due to portable AP technique and slight patient rotation. The heart and mediastinal contours are otherwise unchanged. Aortic calcification. Chronic calcified right lower paratracheal region lymph nodes suggestive of prior granulomatous disease. Limited evaluation of the lung apices due to overlying mandible. Increased interstitial markings. No focal consolidation. No pulmonary edema. No pleural effusion. No pneumothorax. No acute osseous abnormality.  IMPRESSION: 1. Mild pulmonary edema. 2.  Aortic Atherosclerosis (ICD10-I70.0). Electronically Signed   By: Tish Frederickson M.D.   On: 10/13/2022 21:30   DG Ankle Complete Left  Result Date: 10/13/2022 CLINICAL DATA:  Left ankle pain after multiple falls. EXAM: LEFT ANKLE COMPLETE - 3+ VIEW COMPARISON:  None Available. FINDINGS: There is no evidence of acute fracture, dislocation, or joint effusion. There is no evidence of arthropathy. Rounded densities are seen inferior to medial malleolus suggesting degenerative change or old injury. Soft tissues are unremarkable. IMPRESSION: No acute abnormality seen. Electronically Signed   By: Lupita Raider M.D.   On: 10/13/2022 18:20   CT HEAD WO CONTRAST ( )  Result Date: 10/13/2022 CLINICAL DATA:  Generalized weakness and multiple falls. EXAM: CT HEAD WITHOUT CONTRAST TECHNIQUE: Contiguous axial images were obtained from the base of the skull through the vertex without intravenous contrast. RADIATION DOSE REDUCTION: This exam was performed according to the departmental dose-optimization program which includes automated exposure control, adjustment of the mA and/or kV according to patient size and/or use of iterative reconstruction technique. COMPARISON:  August 18, 2022 FINDINGS: Brain: There is mild to moderate severity cerebral atrophy with widening of the extra-axial spaces and ventricular dilatation. There are areas of decreased attenuation within the white matter tracts of the supratentorial brain, consistent with microvascular disease changes. 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 Vascular: No hyperdense vessel or unexpected calcification. Skull: A chronic left-sided nasal bone fracture is seen. Sinuses/Orbits: No acute finding. Other: None. IMPRESSION: 1. Generalized cerebral atrophy with chronic white matter small vessel ischemic changes. 2. 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 3. No acute intracranial abnormality. Electronically Signed   By: Aram Candela M.D.   On: 10/13/2022 17:05   US RENAL  Result Date: 10/13/2022 CLINICAL DATA:  Acute renal failure EXAM: RENAL / URINARY TRACT ULTRASOUND COMPLETE COMPARISON:  None Available. FINDINGS: Right Kidney: Renal measurements: 10.0 x 5.1 x 5.3 cm = volume: 139.9 mL. No collecting system dilatation or perinephric fluid. Along the upper pole of the kidney is a echogenic area along the cortex measuring 15 x 16 x 15 mm this is of uncertain etiology. An underlying lesion is possible Left Kidney: Renal measurements: 10.2 by 6.4 x 4.9 cm = volume: 156.6 mL. Echogenicity within normal limits. No mass or hydronephrosis visualized. Bladder: Bladder is underdistended. Other: None. IMPRESSION: No collecting system dilatation. Ill-defined echogenic area in the upper pole of the right kidney measuring 15 mm. Underlying lesion is not excluded. Dedicated cross-sectional imaging study can be performed when clinically appropriate to confirm presence or absence of the lesion Electronically Signed   By: Karen Kays M.D.   On: 10/13/2022 16:51   DG Chest 2 View  Result Date: 10/13/2022 CLINICAL DATA:  Chest pain, weakness EXAM: CHEST - 2 VIEW COMPARISON:  None Available. FINDINGS: Mild left basilar atelectasis. Right lung is clear. No pleural effusion or pneumothorax. The heart is normal in size.  Thoracic aortic  atherosclerosis. Degenerative changes of the visualized thoracolumbar spine. IMPRESSION: No acute cardiopulmonary disease. Electronically Signed   By: Charline Bills M.D.   On: 10/13/2022 15:40     Assessment & Plan: Mr. Ikaika Bhatia is a 72 y.o.  male with tobacco use, osteoarthritis  and hyperlipidemia who was admitted to [facility] on 10/13/2022 for AKI (acute kidney injury) (HCC) [N17.9]  Acute Kidney Injury with acute metabolic acidosis on chronic kidney disease stage IIIB. Baseline creatinine of 1.74, GFR  of 41 on 08/18/22. Patient claims to not have a history of diabetes. Suspect patient's acute kidney injury secondary to NSAID induced nephropathy. No history of diabetes or hypertension per patient. Renal ultrasound with no obstruction. No IV contrast.  - Continue IV fluids. Monitor volume status - Continue to monitor urine output, serum electrolytes and kidney function. - no acute indication for dialysis today. However low threshold to initiate renal replacement therapy.    LOS: 1 Fate Galanti 8/30/202412:29 PM

## 2022-10-14 NOTE — Consult Note (Signed)
ANTICOAGULATION CONSULT NOTE - Initial Consult  Pharmacy Consult for Heparin Infusion Indication:  suspected PE  No Known Allergies  Patient Measurements: Height: 5\' 9"  (175.3 cm) Weight: 84.2 kg (185 lb 10 oz) IBW/kg (Calculated) : 70.7 Heparin Dosing Weight: 84.2 kg  Vital Signs: Temp: 99.2 F (37.3 C) (08/30 0800) Temp Source: Oral (08/30 0800) BP: 134/59 (08/30 1100) Pulse Rate: 99 (08/30 1400)  Labs: Recent Labs    10/13/22 1714 10/13/22 1844 10/13/22 2102 10/14/22 0006 10/14/22 0234  HGB  --  13.2 13.1  --  11.9*  HCT  --  40.1 40.1  --  34.3*  PLT  --  371 310  --  308  LABPROT  --   --  16.7*  --   --   INR  --   --  1.3*  --   --   CREATININE  --  6.31* 6.55*  --  6.43*  CKTOTAL  --   --   --  1,338*  --   TROPONINIHS 23*  --  25* 39*  --     Estimated Creatinine Clearance: 10.1 mL/min (A) (by C-G formula based on SCr of 6.43 mg/dL (H)).   Medical History: Past Medical History:  Diagnosis Date   Arthritis    Chest pain    Assessment: Joshua Mora is a 72 y.o. male presenting with AKI. PMH significant for HLD, T2DM. Patient was not on [facility] PTA per chart review. D-dimer 11.94. Chest Xray pending. Pharmacy has been consulted to initiate and manage heparin infusion.   Baseline Labs: aPTT 30, PT 18.1, INR 1.5, Hgb 11.9, Hct 34.3, Plt 308   Goal of Therapy:  Heparin level 0.3-0.7 units/ml Monitor platelets by anticoagulation protocol: Yes   Plan:  Give 5050 units bolus x 1 Start heparin infusion at 1350 units/hr Check HL in 8 hours  Continue to monitor H&H and platelets daily while on heparin infusion   Celene Squibb, PharmD Clinical Pharmacist 10/14/2022 3:17 PM

## 2022-10-14 NOTE — Discharge Instructions (Signed)
[facility]  [address]  [address]  [place], [place] [address]  Telephone: [phone]   [facility]                                            [address],  [place], [place] [address]                      P: [phone]  [facility]  [address], [place], [place] [address]  Telephone: [phone]  Fax: [phone]  Maricela Bo Cell: [phone]   [facility]  [address], [place] [address]  Telephone: [phone]  Fax: [phone]   [facility]   [address] [place] [place], [place] [address]  [phone]    Fax: [phone]   Email: [email or web address]   [facility]   [address] [place], [place] [address]  [phone]  Email:[email or web address]   [facility]  [address]  [place], [place] [address]  [phone]  Fax: [phone]  Email: [email or web address]   [facility]  [address]  [place], [place] [address]  Telephone: [phone]   [facility]   [address]  [place], [place] [address]  Telephone: [phone]  Fax: [phone]   [facility]  [address]  [place], [place] [address]  Telephone: [phone]  Fax: [phone]   [facility]  [address]  [place], [place] [address]  Telephone: [phone]  Fax: [phone]   [facility]  [address] [place], [place] [address]  Telephone: [phone]    [facility]  [address]   [place], [place], [address]  Telephone: [phone]   [facility]  [address], [place], [place] [address]  Telephone: [phone]   [facility] [place] [facility]  [address], [place], [place]  [address]                              T Telephone: [phone]  Fax: [phone]  [facility]: Phone:  [phone] [address] [place], [place] [address]  [facility]                                      [address] [place], [place] [address]                                    P: [phone] [address]  [place])  [address]  [place], [place] [address]  Telephone: [phone]  Fax: [phone]   [facility] [place])  [phone] [address], [place] [address]  [phone]  Fax: [phone]   [facility])  [address], [place], [place] [address]  [phone]  (appointments), [phone] (general)  Fax: [phone]   [facility]  [address] [place], [place] [address]                                    p: [phone]  [facility])  [address]  [place], [place] [address]  [phone]  Fax: [phone]   [facility]                                                   [address] [place], [place] [address] [phone]

## 2022-10-14 NOTE — Progress Notes (Signed)
PROGRESS NOTE  Joshua Mora ZOX:[id number] DOB: 1950/03/24 DOA: 10/13/2022 PCP: Sandrea Hughs, NP  HPI/Recap of past 24 hours: Joshua Mora is a 72 y.o. male with medical history significant of alcohol abuse, tobacco use disorder, diet controlled prediabetes, CKD 3B, osteoarthritis s/p left knee replacement who presented to the ED with complaints of progressive weakness and frequent falls over the past 1 month.    On presentation he was found to have severely elevated creatinine above baseline for which nephrology was consulted.  Also with significant leukocytosis above 46,000 with concern for leukemoid reaction in the setting of presumptive UTI.  Trauma imaging were nonacute.  CT head also nonacute.  Hospital course was complicated by acute hypoxic respiratory failure.  Was found to have mild pulmonary edema on chest x-ray.  Elevated D-dimer above 11 with concern for possible pulmonary embolism.  Heparin drip initiated.  10/14/2022: The patient was seen and examined at bedside.  His son was present in the room.  Appears mildly confused.  On O2 supplementation with O2 saturation between 88 and 90% on 4 L nasal cannula.  Not on oxygen supplementation at baseline.  Assessment/Plan: Principal Problem:   AKI (acute kidney injury) (HCC) Active Problems:   Leukocytosis   Frequent falls   Nicotine dependence, uncomplicated  Frequent falls with physical deconditioning Trauma imagings were nonacute CT head also nonacute. Seen by PT OT Continue fall precautions.  Severe AKI, suspect contributed by NSAIDs Baseline creatinine 1.74 with GFR 41. Presented with creatinine of 6.30 with GFR of 9. Nephrology consulted Continue to closely monitor urine output.  Severe sepsis secondary to UTI, POA Presented with leukocytosis, peaked at 49.9K, tachycardia 115, tachypnea 34 Procalcitonin 53, lactic acid greater than 4 UA positive for pyuria Started Rocephin Follow urine culture for ID  and sensitivities. Follow peripheral blood cultures x 2  Acute hypoxic respiratory failure suspect multifactorial Not on oxygen supplementation at baseline Pulmonary edema, mild, seen on chest x-ray and personally reviewed. Currently on 4 L with O2 saturation between 88 and 90% Maintain O2 saturation above 92%.  Elevated D-dimer, rule out thromboembolism D-dimer greater than 11 Starting heparin drip until pulmonary embolism can be ruled out  Elevated troponins, suspect demand ischemia in the setting of hypoxemia Troponin peaked at 39 BNP slightly elevated greater than 100 Follow 2D echo  Left upper extremity edema Obtain Doppler ultrasound to rule out DVT Elevate extremity.  Tobacco use disorder Continue nicotine patch  Alcohol use disorder CIWA protocol in place Continue folic acid, thiamine and multivitamins  History of CVA, seen on head CT scan Noncontrast Head CT scan was nonacute   Critical care time: 75 minutes.   Code Status: Full code  Family Communication: Updated patient's son at bedside.  Also updated the patient's daughter via phone.  Disposition Plan: Anticipate discharge to home with home health services on 10/17/2022.   Consultants: Nephrology.  Procedures: None.  Antimicrobials: Rocephin.  DVT prophylaxis: Heparin drip.  Status is: Inpatient The patient requires at least 2 midnights for further evaluation and treatment of present condition.    Objective: Vitals:   10/14/22 1100 10/14/22 1200 10/14/22 1300 10/14/22 1400  BP: (!) 134/59     Pulse: 60 (!) 104 (!) 42 99  Resp: (!) 29 (!) 25 (!) 34 (!) 27  Temp:      TempSrc:      SpO2: 97% 91% 99% 91%  Weight:      Height:        Intake/Output  Summary (Last 24 hours) at 10/14/2022 1546 Last data filed at 10/14/2022 1000 Gross per 24 hour  Intake 2452.15 ml  Output 431 ml  Net 2021.15 ml   Filed Weights   10/13/22 1458 10/13/22 2121 10/14/22 0500  Weight: 88.9 kg 83.3 kg 84.2 kg     Exam:  General: 72 y.o. year-old male well developed well nourished in no acute distress.  Alert and verbal. Cardiovascular: Tachycardic with no rubs or gallops.  No thyromegaly or JVD noted.   Respiratory: Mild rales at bases with no wheezing noted.  Good inspiratory effort. Abdomen: Soft nontender nondistended with normal bowel sounds x4 quadrants. Musculoskeletal: Left upper extremity edema. Skin: No ulcerative lesions noted or rashes, Psychiatry: Mood is appropriate for condition and setting   Data Reviewed: CBC: Recent Labs  Lab 10/13/22 1555 10/13/22 1844 10/13/22 2102 10/14/22 0234  WBC 46.7* 45.3* 27.8* 49.9*  NEUTROABS  --  39.3* 25.6*  --   HGB 13.5 13.2 13.1 11.9*  HCT 40.4 40.1 40.1 34.3*  MCV 92.4 91.8 93.3 88.6  PLT 384 371 310 308   Basic Metabolic Panel: Recent Labs  Lab 10/13/22 1505 10/13/22 1844 10/13/22 2102 10/14/22 0234  NA 138  --  144 144  K 4.2  --  4.6 3.4*  CL 116*  --  111 106  CO2 10*  --  15* 18*  GLUCOSE 102*  --  106* 116*  BUN 72*  --  75* 77*  CREATININE 6.30* 6.31* 6.55* 6.43*  CALCIUM 8.9  --  8.7* 9.0   GFR: Estimated Creatinine Clearance: 10.1 mL/min (A) (by C-G formula based on SCr of 6.43 mg/dL (H)). Liver Function Tests: Recent Labs  Lab 10/13/22 2102 10/14/22 0234  AST 48* 46*  ALT 27 27  ALKPHOS 41 35*  BILITOT 0.8 0.5  PROT 6.8 6.8  ALBUMIN 3.1* 2.9*   No results for input(s): "LIPASE", "AMYLASE" in the last 168 hours. No results for input(s): "AMMONIA" in the last 168 hours. Coagulation Profile: Recent Labs  Lab 10/13/22 2102  INR 1.3*   Cardiac Enzymes: Recent Labs  Lab 10/14/22 0006  CKTOTAL 1,338*   BNP (last 3 results) No results for input(s): "PROBNP" in the last 8760 hours. HbA1C: Recent Labs    10/13/22 1844  HGBA1C 6.0*   CBG: Recent Labs  Lab 10/13/22 2118 10/14/22 0806 10/14/22 1212  GLUCAP 112* 69* 113*   Lipid Profile: Recent Labs    10/14/22 0234  CHOL 137  HDL  17*  LDLCALC 92  TRIG 140  CHOLHDL 8.1   Thyroid Function Tests: Recent Labs    10/13/22 1844  TSH 0.439   Anemia Panel: Recent Labs    10/14/22 0234  VITAMINB12 726   Urine analysis:    Component Value Date/Time   COLORURINE YELLOW (A) 10/14/2022 0732   APPEARANCEUR CLOUDY (A) 10/14/2022 0732   LABSPEC 1.011 10/14/2022 0732   PHURINE 6.0 10/14/2022 0732   GLUCOSEU NEGATIVE 10/14/2022 0732   HGBUR LARGE (A) 10/14/2022 0732   BILIRUBINUR NEGATIVE 10/14/2022 0732   KETONESUR NEGATIVE 10/14/2022 0732   PROTEINUR 100 (A) 10/14/2022 0732   NITRITE POSITIVE (A) 10/14/2022 0732   LEUKOCYTESUR LARGE (A) 10/14/2022 0732   Sepsis Labs: @LABRCNTIP (procalcitonin:4,lacticidven:4)  ) Recent Results (from the past 240 hour(s))  SARS Coronavirus 2 by RT PCR (hospital order, performed in [facility] hospital lab) *cepheid single result test* Anterior Nasal Swab     Status: None   Collection Time: 10/13/22  3:55 PM  Specimen: Anterior Nasal Swab  Result Value Ref Range Status   SARS Coronavirus 2 by RT PCR NEGATIVE NEGATIVE Final    Comment: (NOTE) SARS-CoV-2 target nucleic acids are NOT DETECTED.  The SARS-CoV-2 RNA is generally detectable in upper and lower respiratory specimens during the acute phase of infection. The lowest concentration of SARS-CoV-2 viral copies this assay can detect is 250 copies / mL. A negative result does not preclude SARS-CoV-2 infection and should not be used as the sole basis for treatment or other patient management decisions.  A negative result may occur with improper specimen collection / handling, submission of specimen other than nasopharyngeal swab, presence of viral mutation(s) within the areas targeted by this assay, and inadequate number of viral copies (<250 copies / mL). A negative result must be combined with clinical observations, patient history, and epidemiological information.  Fact Sheet for Patients:    RoadLapTop.co.za  Fact Sheet for Healthcare Providers: http://kim-miller.com/  This test is not yet approved or  cleared by the [place] FDA and has been authorized for detection and/or diagnosis of SARS-CoV-2 by FDA under an Emergency Use Authorization (EUA).  This EUA will remain in effect (meaning this test can be used) for the duration of the COVID-19 declaration under Section 564(b)(1) of the Act, 21 U.S.C. section 360bbb-3(b)(1), unless the authorization is terminated or revoked sooner.  Performed at [facility], [address] [place], [place] [address]   MRSA Next Gen by PCR, Nasal     Status: None   Collection Time: 10/13/22  9:51 PM   Specimen: Nasal Mucosa; Nasal Swab  Result Value Ref Range Status   MRSA by PCR Next Gen NOT DETECTED NOT DETECTED Final    Comment: (NOTE) The GeneXpert MRSA Assay (FDA approved for NASAL specimens only), is one component of a comprehensive MRSA colonization surveillance program. It is not intended to diagnose MRSA infection nor to guide or monitor treatment for MRSA infections. Test performance is not FDA approved in patients less than 58 years old. Performed at [facility], [address] [place] [place], [place] [address]       Studies: ECHOCARDIOGRAM COMPLETE  Result Date: 10/14/2022    ECHOCARDIOGRAM REPORT   Patient Name:   Joshua Mora Date of Exam: 10/14/2022 Medical Rec #:  [id number]            Height:       69.0 in Accession #:    [id number]           Weight:       185.6 lb Date of Birth:  Oct 01, 1950            BSA:          2.002 m Patient Age:    72 years             BP:           120/61 mmHg Patient Gender: M                    HR:           107 bpm. Exam Location:  [facility] Procedure: 2D Echo, Cardiac Doppler, Color Doppler and Intracardiac            Opacification Agent Indications:     Elevated Troponin  History:         Patient has no prior history of  Echocardiogram examinations.  Signs/Symptoms:Chest Pain; Risk Factors:Diabetes, Dyslipidemia                  and Current Smoker.  Sonographer:     Mikki Harbor Referring Phys:  [id number] Oliver Pila Kirstie Larsen Diagnosing Phys: Julien Nordmann MD  Sonographer Comments: Technically difficult study due to poor echo windows. Image acquisition challenging due to respiratory motion. IMPRESSIONS  1. Left ventricular ejection fraction, by estimation, is 60 to 65%. The left ventricle has normal function. The left ventricle has no regional wall motion abnormalities. There is mild left ventricular hypertrophy. Left ventricular diastolic parameters are consistent with Grade I diastolic dysfunction (impaired relaxation).  2. Right ventricular systolic function is normal. The right ventricular size is normal. Tricuspid regurgitation signal is inadequate for assessing PA pressure.  3. The mitral valve is normal in structure. No evidence of mitral valve regurgitation. No evidence of mitral stenosis.  4. The aortic valve has an indeterminant number of cusps. Aortic valve regurgitation is mild. Aortic valve sclerosis is present, with no evidence of aortic valve stenosis.  5. The inferior vena cava is normal in size with greater than 50% respiratory variability, suggesting right atrial pressure of 3 mmHg. FINDINGS  Left Ventricle: Left ventricular ejection fraction, by estimation, is 60 to 65%. The left ventricle has normal function. The left ventricle has no regional wall motion abnormalities. Definity contrast agent was given IV to delineate the left ventricular  endocardial borders. The left ventricular internal cavity size was normal in size. There is mild left ventricular hypertrophy. Left ventricular diastolic parameters are consistent with Grade I diastolic dysfunction (impaired relaxation). Right Ventricle: The right ventricular size is normal. No increase in right ventricular wall thickness. Right ventricular  systolic function is normal. Tricuspid regurgitation signal is inadequate for assessing PA pressure. Left Atrium: Left atrial size was normal in size. Right Atrium: Right atrial size was normal in size. Pericardium: There is no evidence of pericardial effusion. Mitral Valve: The mitral valve is normal in structure. No evidence of mitral valve regurgitation. No evidence of mitral valve stenosis. MV peak gradient, 4.5 mmHg. The mean mitral valve gradient is 2.0 mmHg. Tricuspid Valve: The tricuspid valve is normal in structure. Tricuspid valve regurgitation is mild . No evidence of tricuspid stenosis. Aortic Valve: The aortic valve has an indeterminant number of cusps. Aortic valve regurgitation is mild. Aortic valve sclerosis is present, with no evidence of aortic valve stenosis. Aortic valve mean gradient measures 5.0 mmHg. Aortic valve peak gradient measures 11.2 mmHg. Aortic valve area, by VTI measures 3.30 cm. Pulmonic Valve: The pulmonic valve was normal in structure. Pulmonic valve regurgitation is not visualized. No evidence of pulmonic stenosis. Aorta: The aortic root is normal in size and structure. Venous: The inferior vena cava is normal in size with greater than 50% respiratory variability, suggesting right atrial pressure of 3 mmHg. IAS/Shunts: No atrial level shunt detected by color flow Doppler.  LEFT VENTRICLE PLAX 2D LVIDd:         4.40 cm   Diastology LVIDs:         2.90 cm   LV e' medial:    7.83 cm/s LV PW:         1.20 cm   LV E/e' medial:  10.2 LV IVS:        1.20 cm   LV e' lateral:   8.38 cm/s LVOT diam:     2.20 cm   LV E/e' lateral: 9.6 LV SV:  77 LV SV Index:   39 LVOT Area:     3.80 cm  RIGHT VENTRICLE RV Basal diam:  2.95 cm RV Mid diam:    2.90 cm RV S prime:     18.80 cm/s LEFT ATRIUM           Index        RIGHT ATRIUM           Index LA diam:      2.90 cm 1.45 cm/m   RA Area:     12.20 cm LA Vol (A4C): 46.8 ml 23.38 ml/m  RA Volume:   27.00 ml  13.49 ml/m  AORTIC VALVE                     PULMONIC VALVE AV Area (Vmax):    2.94 cm     PV Vmax:       1.00 m/s AV Area (Vmean):   3.49 cm     PV Peak grad:  4.0 mmHg AV Area (VTI):     3.30 cm AV Vmax:           167.00 cm/s AV Vmean:          97.100 cm/s AV VTI:            0.234 m AV Peak Grad:      11.2 mmHg AV Mean Grad:      5.0 mmHg LVOT Vmax:         129.00 cm/s LVOT Vmean:        89.100 cm/s LVOT VTI:          0.203 m LVOT/AV VTI ratio: 0.87  AORTA Ao Root diam: 3.20 cm MITRAL VALVE MV Area (PHT): 3.46 cm     SHUNTS MV Area VTI:   4.06 cm     Systemic VTI:  0.20 m MV Peak grad:  4.5 mmHg     Systemic Diam: 2.20 cm MV Mean grad:  2.0 mmHg MV Vmax:       1.06 m/s MV Vmean:      61.0 cm/s MV Decel Time: 219 msec MV E velocity: 80.20 cm/s MV A velocity: 114.00 cm/s MV E/A ratio:  0.70 Julien Nordmann MD Electronically signed by Julien Nordmann MD Signature Date/Time: 10/14/2022/1:31:20 PM    Final    US Venous Img Lower Bilateral (DVT)  Result Date: 10/14/2022 CLINICAL DATA:  10026 Shortness of breath 10026 EXAM: BILATERAL LOWER EXTREMITY VENOUS DOPPLER ULTRASOUND TECHNIQUE: Gray-scale sonography with graded compression, as well as color Doppler and duplex ultrasound were performed to evaluate the lower extremity deep venous systems from the level of the common femoral vein and including the common femoral, femoral, profunda femoral, popliteal and calf veins including the posterior tibial, peroneal and gastrocnemius veins when visible. The superficial great saphenous vein was also interrogated. Spectral Doppler was utilized to evaluate flow at rest and with distal augmentation maneuvers in the common femoral, femoral and popliteal veins. COMPARISON:  Lower extremity XRs, 10/13/2022. FINDINGS: RIGHT LOWER EXTREMITY VENOUS Normal compressibility of the RIGHT common femoral, superficial femoral, and popliteal veins, as well as the visualized calf veins. Visualized portions of profunda femoral vein and great saphenous vein unremarkable.  No filling defects to suggest DVT on grayscale or color Doppler imaging. Doppler waveforms show normal direction of venous flow, normal respiratory plasticity and response to augmentation. OTHER No evidence of superficial thrombophlebitis or abnormal fluid collection. Limitations: none LEFT LOWER EXTREMITY VENOUS Normal compressibility of the LEFT  common femoral, superficial femoral, and popliteal veins, as well as the visualized calf veins. Visualized portions of profunda femoral vein and great saphenous vein unremarkable. No filling defects to suggest DVT on grayscale or color Doppler imaging. Doppler waveforms show normal direction of venous flow, normal respiratory plasticity and response to augmentation. OTHER No evidence of superficial thrombophlebitis or abnormal fluid collection. Limitations: none IMPRESSION: No evidence of femoropopliteal DVT or superficial thrombophlebitis within either lower extremity. Roanna Banning, MD [facility] Radiology Electronically Signed   By: Roanna Banning M.D.   On: 10/14/2022 06:21   DG Chest Port 1 View  Result Date: 10/13/2022 CLINICAL DATA:  16109 Acute respiratory distress 66502 EXAM: PORTABLE CHEST 1 VIEW.  Patient is rotated. COMPARISON:  Chest x-ray 10/13/2022, chest x-ray report 05/15/2017 FINDINGS: Slightly more prominent thoracic aorta silhouette likely due to portable AP technique and slight patient rotation. The heart and mediastinal contours are otherwise unchanged. Aortic calcification. Chronic calcified right lower paratracheal region lymph nodes suggestive of prior granulomatous disease. Limited evaluation of the lung apices due to overlying mandible. Increased interstitial markings. No focal consolidation. No pulmonary edema. No pleural effusion. No pneumothorax. No acute osseous abnormality. IMPRESSION: 1. Mild pulmonary edema. 2.  Aortic Atherosclerosis (ICD10-I70.0). Electronically Signed   By: Tish Frederickson M.D.   On: 10/13/2022 21:30   DG Ankle Complete Left  Result Date: 10/13/2022 CLINICAL DATA:  Left ankle pain after multiple falls. EXAM: LEFT ANKLE COMPLETE - 3+ VIEW COMPARISON:  None Available. FINDINGS: There is no evidence of acute fracture, dislocation, or joint effusion. There is no evidence of arthropathy. Rounded densities are seen inferior to medial malleolus suggesting degenerative change or old injury. Soft tissues are unremarkable. IMPRESSION: No acute abnormality seen. Electronically Signed   By: Lupita Raider M.D.   On: 10/13/2022 18:20   CT HEAD WO CONTRAST ( )  Result Date: 10/13/2022 CLINICAL DATA:  Generalized weakness and multiple falls. EXAM: CT HEAD WITHOUT CONTRAST TECHNIQUE: Contiguous axial images were obtained from the base of the skull through the vertex without intravenous contrast. RADIATION DOSE REDUCTION: This exam was performed according to the departmental dose-optimization program which includes automated exposure control, adjustment of the mA and/or kV according to patient size and/or use of iterative reconstruction technique. COMPARISON:  August 18, 2022 FINDINGS: Brain: There is mild to moderate severity cerebral atrophy with widening of the extra-axial spaces and ventricular dilatation. There are areas of decreased attenuation within the white matter tracts of the supratentorial brain, consistent with microvascular disease changes. 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 Vascular: No hyperdense vessel or unexpected calcification. Skull: A chronic left-sided nasal bone fracture is seen. Sinuses/Orbits: No acute finding. Other: None. IMPRESSION: 1. Generalized cerebral atrophy with chronic white matter small vessel ischemic changes. 2. 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 3. No acute intracranial abnormality. Electronically Signed   By: Aram Candela M.D.   On:  10/13/2022 17:05   US RENAL  Result Date: 10/13/2022 CLINICAL DATA:  Acute renal failure EXAM: RENAL / URINARY TRACT ULTRASOUND COMPLETE COMPARISON:  None Available. FINDINGS: Right Kidney: Renal measurements: 10.0 x 5.1 x 5.3 cm = volume: 139.9 mL. No collecting system dilatation or perinephric fluid. Along the upper pole of the kidney is a echogenic area along the cortex measuring 15 x 16 x 15 mm this is of uncertain etiology. An underlying lesion is possible Left Kidney: Renal measurements: 10.2  by 6.4 x 4.9 cm = volume: 156.6 mL. Echogenicity within normal limits. No mass or hydronephrosis visualized. Bladder: Bladder is underdistended. Other: None. IMPRESSION: No collecting system dilatation. Ill-defined echogenic area in the upper pole of the right kidney measuring 15 mm. Underlying lesion is not excluded. Dedicated cross-sectional imaging study can be performed when clinically appropriate to confirm presence or absence of the lesion Electronically Signed   By: Karen Kays M.D.   On: 10/13/2022 16:51    Scheduled Meds:  vitamin C  500 mg Oral BID   Chlorhexidine Gluconate Cloth  6 each Topical Daily   cyanocobalamin  1,000 mcg Oral Daily   feeding supplement  1 Container Oral TID BM   [START ON 10/15/2022] feeding supplement  237 mL Oral TID BM   folic acid  1 mg Oral Daily   heparin  5,050 Units Intravenous Once   multivitamin with minerals  1 tablet Oral Daily   nicotine  14 mg Transdermal Daily   thiamine  100 mg Oral Daily   Or   thiamine  100 mg Intravenous Daily   Vitamin D (Ergocalciferol)  50,000 Units Oral Q7 days    Continuous Infusions:  cefTRIAXone (ROCEPHIN)  IV     heparin     lactated ringers       LOS: 1 day     Darlin Drop, MD [facility] Pager [phone]  If 7PM-7AM, please contact night-coverage [email or web address] Password [facility] 10/14/2022, 3:46 PM

## 2022-10-14 NOTE — Progress Notes (Signed)
*  PRELIMINARY RESULTS* Echocardiogram 2D Echocardiogram has been performed.  Carolyne Fiscal 10/14/2022, 10:02 AM

## 2022-10-14 NOTE — Evaluation (Signed)
Occupational Therapy Evaluation Patient Details Name: Joshua Mora MRN: [id number] DOB: [birth year] Today's Date: 10/14/2022   History of Present Illness Pt is a 72 year old presenting to the ED with progressive weakness and frequent falls; admitted with AKI, leukocytosis  PMH significant for osteoarthritis s/p left knee replacement   Clinical Impression   Chart reviewed, pt greeted in bed, request from nursing to assist with linen change after pt incontinent BM in bed. PTA pt is MOD I In ADL/IADL, has prn assist, amb with no AD until approx 1 week ago, now amb with RW. Pt with multiple fall history since July. Pt presents with deficits in strengthening, endurance, balance, cognition, activity tolerance affecting safe and optimal ADL completion. OT will continue to follow acutely.       If plan is discharge home, recommend the following: A lot of help with walking and/or transfers;A lot of help with bathing/dressing/bathroom;Supervision due to cognitive status;Assist for transportation;Direct supervision/assist for medications management;Direct supervision/assist for financial management;Help with stairs or ramp for entrance    Functional Status Assessment  Patient has had a recent decline in their functional status and demonstrates the ability to make significant improvements in function in a reasonable and predictable amount of time.  Equipment Recommendations  Other (comment) (per next venue of care)    Recommendations for Other Services       Precautions / Restrictions Precautions Precautions: Fall Precaution Comments: no restrictions per nursing orders, pt has home/personal L knee brace on per chart provided from [facility]; team notified Restrictions LLE Weight Bearing: Weight bearing as tolerated      Mobility Bed Mobility Overal bed mobility: Needs Assistance Bed Mobility: Rolling Rolling: Max assist, +2 for physical assistance, +2 for safety/equipment          General bed mobility comments: for linen change after soiled in bed 2x per nurse report    Transfers                   General transfer comment: not approrpiate to attempt at this time, pt with n&v, frequent BM; will continue to assess      Balance                                           ADL either performed or assessed with clinical judgement   ADL Overall ADL's : Needs assistance/impaired     Grooming: Moderate assistance   Upper Body Bathing: Maximal assistance   Lower Body Bathing: Maximal assistance   Upper Body Dressing : Maximal assistance   Lower Body Dressing: Maximal assistance       Toileting- Clothing Manipulation and Hygiene: Total assistance;Bed level         General ADL Comments: frequent multi modal cues for task completion, anticipate +2 for out of bed attempts     Vision Patient Visual Report: No change from baseline       Perception         Praxis         Pertinent Vitals/Pain Pain Assessment Pain Assessment: 0-10 Pain Score: 10-Worst pain ever Pain Location: B ankles Pain Descriptors / Indicators: Aching, Discomfort Pain Intervention(s): Limited activity within patient's tolerance, Monitored during session, Repositioned     Extremity/Trunk Assessment Upper Extremity Assessment Upper Extremity Assessment: Generalized weakness   Lower Extremity Assessment Lower Extremity Assessment: Generalized weakness;LLE deficits/detail LLE Deficits / Details:  L knee with personal knee brace donned, readjusted by this thearpist; noted open area on knee from pt reported fall at home on knee; swelling throughoutL ankle       Communication Communication Communication: Difficulty following commands/understanding Following commands: Follows one step commands with increased time Cueing Techniques: Verbal cues;Tactile cues;Visual cues   Cognition Arousal: Alert Behavior During Therapy: Flat affect Overall Cognitive  Status: Impaired/Different from baseline Area of Impairment: Orientation, Following commands, Safety/judgement, Awareness, Problem solving                 Orientation Level: Disoriented to, Time, Situation     Following Commands: Follows one step commands with increased time Safety/Judgement: Decreased awareness of deficits, Decreased awareness of safety Awareness: Emergent Problem Solving: Slow processing, Difficulty sequencing, Requires verbal cues, Requires tactile cues       General Comments  vss throughout on 5 L via [place], bruising noted on L ankle    Exercises Other Exercises Other Exercises: edu re: role of OT, role of rehab, future session planning   Shoulder Instructions      Home Living Family/patient expects to be discharged to:: Private residence Living Arrangements: Children;Other relatives (sister) Available Help at Discharge: Family Type of Home: House Home Access: Ramped entrance Entrance Stairs-Number of Steps: through garage   Home Layout: One level     Bathroom Shower/Tub: Producer, television/film/video: Standard Bathroom Accessibility: Yes   Home Equipment: Agricultural consultant (2 wheels);Grab bars - tub/shower;Shower seat - built in;Shower seat;Hand held shower head (hurry cane)   Additional Comments: sleeps in a twin bed      Prior Functioning/Environment Prior Level of Function : History of Falls (last six months);Driving             Mobility Comments: amb with a RW since July due to falls, 6-7 reported falls since July 4, walk to mailbox ADLs Comments: MOD I with ADL/IADL, can cook/clean, doesnt normally        OT Problem List: Decreased activity tolerance;Decreased strength;Impaired balance (sitting and/or standing);Decreased cognition;Decreased knowledge of use of DME or AE;Impaired vision/perception;Decreased safety awareness      OT Treatment/Interventions: Self-care/ADL training;Therapeutic exercise;Patient/family  education;Balance training;Energy conservation;Therapeutic activities;DME and/or AE instruction    OT Goals(Current goals can be found in the care plan section) Acute Rehab OT Goals Patient Stated Goal: eat something OT Goal Formulation: With patient Time For Goal Achievement: 10/28/22 Potential to Achieve Goals: Good ADL Goals Pt Will Perform Grooming: sitting;with modified independence Pt Will Perform Lower Body Dressing: with modified independence;sitting/lateral leans Pt Will Transfer to Toilet: with modified independence;ambulating Pt Will Perform Toileting - Clothing Manipulation and hygiene: with modified independence;sitting/lateral leans  OT Frequency: Min 1X/week    Co-evaluation              AM-PAC OT "6 Clicks" Daily Activity     Outcome Measure Help from another person eating meals?: A Lot Help from another person taking care of personal grooming?: A Lot Help from another person toileting, which includes using toliet, bedpan, or urinal?: Total Help from another person bathing (including washing, rinsing, drying)?: Total Help from another person to put on and taking off regular upper body clothing?: A Lot Help from another person to put on and taking off regular lower body clothing?: A Lot 6 Click Score: 10   End of Session Equipment Utilized During Treatment: Oxygen Nurse Communication: Mobility status  Activity Tolerance: Treatment limited secondary to medical complications (Comment) Patient left: in bed;with call  bell/phone within reach;with family/visitor present  OT Visit Diagnosis: Other abnormalities of gait and mobility (R26.89);Muscle weakness (generalized) (M62.81)                Time: 1610-9604 OT Time Calculation (min): 22 min Charges:  OT General Charges $OT Visit: 1 Visit OT Evaluation $OT Eval High Complexity: 1 High  Oleta Mouse, OTD OTR/L  10/14/22, 1:45 PM

## 2022-10-14 NOTE — Plan of Care (Signed)
Continuing with plan of care. 

## 2022-10-14 NOTE — Progress Notes (Signed)
Shift Summary: Patient arrived post RR tachycardic and tachypnic on NRB. BP stable. Bicarb gtt infusing. Patient due to void, performed serial bladder scans revealing 61 and 86ml. urine collected from external catheter. Lasix challenge given per order, confirmed safe with elevated CK of 1338. Dr. Para March approved. PO Kcl given with Lasix for K+ of 3.4. Patient mentation improved since arrival, safety mitts removed for breakfast. Bil LE U/S completed as ordered. No acute events since CCU admission.

## 2022-10-14 NOTE — Progress Notes (Signed)
VAST consult received to obtain IV access. Bilateral arms assessed utilizing ultrasound. Left hand and lower arm extremely edematous from previous IV infiltrate; inappropriate for IV placement and no appropriated vessels visualized. Attempted IV placement in right anterior forearm without success. 22G IV placed in left upper arm cephalic, but was difficult to place.  Patient is not an appropriate PICC candidate due to possible need for dialysis in near future, unless nephrologist approves PICC placement.

## 2022-10-15 ENCOUNTER — Inpatient Hospital Stay: Payer: Medicare PPO

## 2022-10-15 DIAGNOSIS — T796XXA Traumatic ischemia of muscle, initial encounter: Secondary | ICD-10-CM | POA: Diagnosis present

## 2022-10-15 DIAGNOSIS — R6520 Severe sepsis without septic shock: Secondary | ICD-10-CM

## 2022-10-15 DIAGNOSIS — A419 Sepsis, unspecified organism: Secondary | ICD-10-CM | POA: Diagnosis not present

## 2022-10-15 DIAGNOSIS — N170 Acute kidney failure with tubular necrosis: Secondary | ICD-10-CM | POA: Diagnosis not present

## 2022-10-15 DIAGNOSIS — N1832 Chronic kidney disease, stage 3b: Secondary | ICD-10-CM | POA: Diagnosis not present

## 2022-10-15 LAB — CBC WITH DIFFERENTIAL/PLATELET
Abs Immature Granulocytes: 2.94 10*3/uL — ABNORMAL HIGH (ref 0.00–0.07)
Basophils Absolute: 0 10*3/uL (ref 0.0–0.1)
Basophils Relative: 0 %
Eosinophils Absolute: 0.1 10*3/uL (ref 0.0–0.5)
Eosinophils Relative: 0 %
HCT: 31.9 % — ABNORMAL LOW (ref 39.0–52.0)
Hemoglobin: 11 g/dL — ABNORMAL LOW (ref 13.0–17.0)
Immature Granulocytes: 7 %
Lymphocytes Relative: 4 %
Lymphs Abs: 1.8 10*3/uL (ref 0.7–4.0)
MCH: 31.1 pg (ref 26.0–34.0)
MCHC: 34.5 g/dL (ref 30.0–36.0)
MCV: 90.1 fL (ref 80.0–100.0)
Monocytes Absolute: 2.3 10*3/uL — ABNORMAL HIGH (ref 0.1–1.0)
Monocytes Relative: 5 %
Neutro Abs: 37.8 10*3/uL — ABNORMAL HIGH (ref 1.7–7.7)
Neutrophils Relative %: 84 %
Platelets: 234 10*3/uL (ref 150–400)
RBC: 3.54 MIL/uL — ABNORMAL LOW (ref 4.22–5.81)
RDW: 16.3 % — ABNORMAL HIGH (ref 11.5–15.5)
Smear Review: NORMAL
WBC: 44.9 10*3/uL — ABNORMAL HIGH (ref 4.0–10.5)
nRBC: 0.1 % (ref 0.0–0.2)

## 2022-10-15 LAB — BASIC METABOLIC PANEL
Anion gap: 12 (ref 5–15)
BUN: 80 mg/dL — ABNORMAL HIGH (ref 8–23)
CO2: 24 mmol/L (ref 22–32)
Calcium: 8.1 mg/dL — ABNORMAL LOW (ref 8.9–10.3)
Chloride: 104 mmol/L (ref 98–111)
Creatinine, Ser: 6.18 mg/dL — ABNORMAL HIGH (ref 0.61–1.24)
GFR, Estimated: 9 mL/min — ABNORMAL LOW (ref >60)
Glucose, Bld: 131 mg/dL — ABNORMAL HIGH (ref 70–99)
Potassium: 3.9 mmol/L (ref 3.5–5.1)
Sodium: 140 mmol/L (ref 135–145)

## 2022-10-15 LAB — HEPARIN LEVEL (UNFRACTIONATED)
Heparin Unfractionated: 0.24 [IU]/mL — ABNORMAL LOW (ref 0.30–0.70)
Heparin Unfractionated: 0.22 [IU]/mL — ABNORMAL LOW (ref 0.30–0.70)

## 2022-10-15 LAB — HEPATITIS B CORE ANTIBODY, TOTAL: Hep B Core Total Ab: NONREACTIVE

## 2022-10-15 LAB — HEPATITIS B CORE ANTIBODY, IGM: Hep B C IgM: NONREACTIVE

## 2022-10-15 LAB — BLOOD GAS, VENOUS
Acid-base deficit: 0.8 mmol/L (ref 0.0–2.0)
Bicarbonate: 23.5 mmol/L (ref 20.0–28.0)
O2 Saturation: 69.8 %
Patient temperature: 37
pCO2, Ven: 37 mmHg — ABNORMAL LOW (ref 44–60)
pH, Ven: 7.41 (ref 7.25–7.43)
pO2, Ven: 42 mmHg (ref 32–45)

## 2022-10-15 LAB — LACTIC ACID, PLASMA
Lactic Acid, Venous: 1.1 mmol/L (ref 0.5–1.9)
Lactic Acid, Venous: 1.3 mmol/L (ref 0.5–1.9)

## 2022-10-15 LAB — PROCALCITONIN: Procalcitonin: 60.67 ng/mL

## 2022-10-15 LAB — HIV ANTIBODY (ROUTINE TESTING W REFLEX): HIV Screen 4th Generation wRfx: NONREACTIVE

## 2022-10-15 LAB — PHOSPHORUS: Phosphorus: 4.2 mg/dL (ref 2.5–4.6)

## 2022-10-15 LAB — GLUCOSE, CAPILLARY: Glucose-Capillary: 85 mg/dL (ref 70–99)

## 2022-10-15 LAB — HEPATITIS B SURFACE ANTIGEN: Hepatitis B Surface Ag: NONREACTIVE

## 2022-10-15 LAB — MAGNESIUM: Magnesium: 1.8 mg/dL (ref 1.7–2.4)

## 2022-10-15 LAB — BRAIN NATRIURETIC PEPTIDE: B Natriuretic Peptide: 328 pg/mL — ABNORMAL HIGH (ref 0.0–100.0)

## 2022-10-15 MED ORDER — CHLORHEXIDINE GLUCONATE CLOTH 2 % EX PADS
6.0000 | MEDICATED_PAD | Freq: Every day | CUTANEOUS | Status: DC
  Administered 2022-10-19 – 2022-11-04 (×16): 6 via TOPICAL

## 2022-10-15 MED ORDER — ALTEPLASE 2 MG IJ SOLR
2.0000 mg | Freq: Once | INTRAMUSCULAR | Status: AC | PRN
  Administered 2022-10-27: 2 mg

## 2022-10-15 MED ORDER — HEPARIN BOLUS VIA INFUSION
1300.0000 [IU] | Freq: Once | INTRAVENOUS | Status: AC
  Administered 2022-10-15: 1300 [IU] via INTRAVENOUS
  Filled 2022-10-15: qty 1300

## 2022-10-15 MED ORDER — HEPARIN SODIUM (PORCINE) 1000 UNIT/ML DIALYSIS
1000.0000 [IU] | INTRAMUSCULAR | Status: DC | PRN
  Administered 2022-10-15: 2800 [IU]
  Administered 2022-10-27: 2000 [IU]
  Filled 2022-10-15 (×4): qty 1

## 2022-10-15 MED ORDER — TECHNETIUM TO 99M ALBUMIN AGGREGATED
4.6800 | Freq: Once | INTRAVENOUS | Status: AC | PRN
  Administered 2022-10-15: 4.68 via INTRAVENOUS

## 2022-10-15 NOTE — Progress Notes (Signed)
Shift Summary: Patient more altered this shift, only oriented to person. Tmax 101.3 axillary, Tylenol administered as ordered.  CIWA scores 6 x 2. Treated with a total of 1mg  IV Ativan. 175 ml UOP in external catheter and 2 episodes of urine leakage. Trialysis catheter placed this shift, no acute events. Uncollected labs obtained. Patient BP soft. 5 beat run of Vtach, asymptomatic.

## 2022-10-15 NOTE — Progress Notes (Signed)
Progress Note   Patient: Joshua Mora QIH:[id number] DOB: 09/28/1950 DOA: 10/13/2022     2 DOS: the patient was seen and examined on 10/15/2022   Brief hospital course: Joshua Mora is a 72 y.o. male with medical history significant of alcohol abuse, tobacco use disorder, diet controlled prediabetes, CKD 3B, osteoarthritis s/p left knee replacement who presented to the ED with complaints of progressive weakness and frequent falls over the past 1 month.     On presentation he was found to have severely elevated creatinine above baseline for which nephrology was consulted.  Also with significant leukocytosis above 46,000 with concern for leukemoid reaction in the setting of presumptive UTI.  Trauma imaging were nonacute.  CT head also nonacute.  Hospital course was complicated by acute hypoxic respiratory failure.  Was found to have mild pulmonary edema on chest x-ray.  Elevated D-dimer above 11 with concern for possible pulmonary embolism.  Heparin drip initiated.    Principal Problem:   AKI (acute kidney injury) (HCC) Active Problems:   Leukocytosis   Frequent falls   Nicotine dependence, uncomplicated   Assessment and Plan: Acute hypoxemic respiratory failure. Acute acute on chronic diastolic congestive heart failure. Need to rule out pulm emboli. Patient developed significant short of breath, hypoxemia.  This appeared to be secondary to volume overload with worsening renal function and acute on chronic diastolic congestive heart failure. Echocardiogram performed, ejection from 60 to 65% with diastolic dysfunction. Patient will be dialyzed today to remove extra fluids. Patient has significant elevation of D-dimer, upper extremity and lower extremity duplex ultrasound did not show DVT. Patient currently was started on heparin drip, will obtain pulmonary perfusion test to rule out PE.  Severe sepsis. Urinary tract infection. Patient met severe sepsis criteria with  tachycardia, severe leukocytosis.  Per daughter, patient did have significant urinary symptoms.  Lactic acid level was 4.3.  Procalcitonin level 60.67.  Reviewed patient chest x-ray, did not show any evidence of pneumonia, patient did not have a cough or fever.  Most likely this is due to UTI.  Rocephin was started at the 2 g every 24 hours. Blood cultures so far negative, urine culture still pending.  Continue current treatment.  Frequent falls. Deconditioning. Rhabdomyolysis from fall. Patient will be dialyzed today. Seen by PT/OT, recommending nursing home placement.  History of CVA. No recurrence.  Elevated troponin secondary to demand ischemia from hypoxemia. Continue to follow.    Subjective:  Patient still complaining short of breath with exertion, hypoxia, on high flow oxygen. He had some diarrhea yesterday, which had resolved today.   Physical Exam: Vitals:   10/15/22 0800 10/15/22 0900 10/15/22 1000 10/15/22 1100  BP: 133/64 133/62 (!) 117/54 110/63  Pulse: 86 86 90 96  Resp: (!) 21 19 20  (!) 21  Temp: 98.4 F (36.9 C)     TempSrc: Oral     SpO2: 93% 92% 91% 92%  Weight:      Height:       General exam: Appears calm and comfortable  Respiratory system: Some crackles in the base. Respiratory effort normal. Cardiovascular system: S1 & S2 heard, RRR. No JVD, murmurs, rubs, gallops or clicks. No pedal edema. Gastrointestinal system: Abdomen is nondistended, soft and nontender. No organomegaly or masses felt. Normal bowel sounds heard. Central nervous system: Alert and oriented x2. No focal neurological deficits. Extremities: Symmetric 5 x 5 power. Skin: No rashes, lesions or ulcers Psychiatry:  Mood & affect appropriate.     Data Reviewed:  Review lab results, chest x-ray, ultrasound results.  Family Communication: Daughter updated at bedside.  Disposition: Status is: Inpatient Remains inpatient appropriate because: Severity of illness, IV treatment.   Inpatient procedure.     Time spent: 55 minutes  Author: Marrion Coy, MD 10/15/2022 11:27 AM  For on call review [email or web address].

## 2022-10-15 NOTE — Progress Notes (Signed)
Received patient at bedside in ICU.  Alert and oriented.  Informed consent signed and in chart.   TX duration: 2 Hours  Patient tolerated well; with interventions.  Patient resting comfortably, stable condition  Alert, without acute distress.  Hand-off given to patient's nurse.   Access used: RIJ temporary catheter Access issues: None  Total UF removed: 300 mL Medication(s) given: None Post HD VS: please see Data Insert Post HD weight: Unable to obtain    10/15/22 2249  Vitals  Temp (!) 97.4 F (36.3 C)  Temp Source Oral  BP 105/60  MAP (mmHg) 75  BP Location Right Arm  BP Method Automatic  Patient Position (if appropriate) Lying  Pulse Rate (!) 106  Pulse Rate Source Monitor  ECG Heart Rate (!) 107  Resp (!) 21  Oxygen Therapy  O2 Device Nasal Cannula  O2 Flow Rate (L/min) 6 L/min  Patient Activity (if Appropriate) In bed  Pulse Oximetry Type Continuous  Post Treatment  Dialyzer Clearance Lightly streaked  Hemodialysis Intake (mL) 200 mL  Liters Processed 24  Fluid Removed (mL) 300 mL  Tolerated HD Treatment Yes  Note  Patient Observations Patient awakened from sleep, responsive to verbal cues, no c/o voiced, no acute distress noted; conditon stable at time of this treatment d/c.  Hemodialysis Catheter Right Internal jugular Triple lumen Temporary (Non-Tunneled)  Placement Date/Time: 10/15/22 0119   Placed prior to admission: No  Time Out: Correct patient;Correct site;Correct procedure  Maximum sterile barrier precautions: Hand hygiene;Cap;Mask;Sterile gown;Sterile gloves;Large sterile sheet;Sterile probe cove...  Site Condition No complications  Blue Lumen Status Flushed;Heparin locked;Dead end cap in place  Red Lumen Status Flushed;Heparin locked;Dead end cap in place  Purple Lumen Status Other (Comment) (Not in use)  Catheter fill solution Heparin 1000 units/ml  Catheter fill volume (Arterial) 1.4 cc  Catheter fill volume (Venous) 1.4  Dressing Type  Transparent  Dressing Status Antimicrobial disc in place;Clean, Dry, Intact  Drainage Description None  Post treatment catheter status Capped and Clamped      [facility]

## 2022-10-15 NOTE — Consult Note (Signed)
ANTICOAGULATION CONSULT NOTE - Initial Consult  Pharmacy Consult for Heparin Infusion Indication:  suspected PE  No Known Allergies  Patient Measurements: Height: 5\' 9"  (175.3 cm) Weight: 87.2 kg (192 lb 3.9 oz) IBW/kg (Calculated) : 70.7 Heparin Dosing Weight: 84.2 kg  Vital Signs: Temp: 98.4 F (36.9 C) (08/31 0800) Temp Source: Oral (08/31 0800) BP: 110/63 (08/31 1100) Pulse Rate: 96 (08/31 1100)  Labs: Recent Labs    10/13/22 1714 10/13/22 1844 10/13/22 2102 10/14/22 0006 10/14/22 0234 10/14/22 1601 10/15/22 0127 10/15/22 0129 10/15/22 0955  HGB  --    < > 13.1  --  11.9*  --  11.0*  --   --   HCT  --    < > 40.1  --  34.3*  --  31.9*  --   --   PLT  --    < > 310  --  308  --  234  --   --   APTT  --   --   --   --   --  30  --   --   --   LABPROT  --   --  16.7*  --   --  18.1*  --   --   --   INR  --   --  1.3*  --   --  1.5*  --   --   --   HEPARINUNFRC  --   --   --   --   --   --   --   --  0.24*  CREATININE  --    < > 6.55*  --  6.43*  --   --  6.18*  --   CKTOTAL  --   --   --  1,338*  --   --   --   --   --   TROPONINIHS 23*  --  25* 39*  --   --   --   --   --    < > = values in this interval not displayed.    Estimated Creatinine Clearance: 11.5 mL/min (A) (by C-G formula based on SCr of 6.18 mg/dL (H)).   Medical History: Past Medical History:  Diagnosis Date   Arthritis    Chest pain    Assessment: Joshua Mora is a 72 y.o. male presenting with AKI. PMH significant for HLD, T2DM. Patient was not on [facility] PTA per chart review. D-dimer 11.94. Chest Xray pending. Pharmacy has been consulted to initiate and manage heparin infusion.   Baseline Labs: aPTT 30, PT 18.1, INR 1.5, Hgb 11.9, Hct 34.3, Plt 308   Goal of Therapy:  Heparin level 0.3-0.7 units/ml Monitor platelets by anticoagulation protocol: Yes  8/31@0955 : HL 0.24, subtherapeutic@1350  units/hr   Plan:  Give 1300 units bolus x 1 Start heparin infusion at 1550 units/hr Check  HL in 8 hours  Continue to monitor H&H and platelets daily while on heparin infusion   Bettey Costa, PharmD Clinical Pharmacist 10/15/2022 11:32 AM

## 2022-10-15 NOTE — Progress Notes (Signed)
Patient alert to self and place, NSR, 4L [place], with stable pressures. Patient resting in bed, very drowsy but alert to voice. Patient drifting back off to sleep. MD made aware. Family at bedside.   Patient worked with PT, able to sit on edge of bed with assistance. Bath performed during this time. Patient more alert and participating in conversation and care.   Patient diet changed to Renal diet with 1200 fluid restrictions. Education provided to patient and family members at bedside.   Patient traveled to [facility] for scan. Tolerated well.   Patient eating in room with family member assistance. Family educated again on fluid restrictions. Family denies being aware despite being in room. Refuses to let RN take large cup brought in from outside.   RN entered room to start medications. RN noticed patient drifting off to sleep. RN asked patient if he still had food in mouth. Encouraged the patient to finish chewing and swallow the food. Family member asked RN to leave, stated they were fine. RN educated family member on aspiration. Patient in bed, with eyes closed, occasionally chewing. RN asked patient to keep chewing and swallow the food. Ensure provided. Patient continued to chew food in mouth and drink Ensure with RN present. Once patient had finished swallowing. RN left, patient trying to rest.  Patient son has door closed. Television volume turned up, can be heard at nurses station. RN entered the room. Begins speaking to patient, son in chair watching football game, begins screaming at RN for not knocking. RN apologizes to son for not knocking loud enough.  Family member continues screaming at RN ask RN to leave. RN left the room. Charge RN made aware.   Daughter at bedside, Patient resting in bed. Discussed informed consent form, patient states daughter can sign. Consent in chart.

## 2022-10-15 NOTE — Consult Note (Signed)
ANTICOAGULATION CONSULT NOTE  Pharmacy Consult for Heparin Infusion Indication:  suspected PE  No Known Allergies  Patient Measurements: Height: 5\' 9"  (175.3 cm) Weight: 87.2 kg (192 lb 3.9 oz) IBW/kg (Calculated) : 70.7 Heparin Dosing Weight: 84.2 kg  Vital Signs: Temp: 97.8 F (36.6 C) (08/31 2021) Temp Source: Oral (08/31 2021) BP: 110/61 (08/31 2045) Pulse Rate: 99 (08/31 2045)  Labs: Recent Labs    10/13/22 1714 10/13/22 1844 10/13/22 2102 10/14/22 0006 10/14/22 0234 10/14/22 1601 10/15/22 0127 10/15/22 0129 10/15/22 0955 10/15/22 1955  HGB  --    < > 13.1  --  11.9*  --  11.0*  --   --   --   HCT  --    < > 40.1  --  34.3*  --  31.9*  --   --   --   PLT  --    < > 310  --  308  --  234  --   --   --   APTT  --   --   --   --   --  30  --   --   --   --   LABPROT  --   --  16.7*  --   --  18.1*  --   --   --   --   INR  --   --  1.3*  --   --  1.5*  --   --   --   --   HEPARINUNFRC  --   --   --   --   --   --   --   --  0.24* 0.22*  CREATININE  --    < > 6.55*  --  6.43*  --   --  6.18*  --   --   CKTOTAL  --   --   --  1,338*  --   --   --   --   --   --   TROPONINIHS 23*  --  25* 39*  --   --   --   --   --   --    < > = values in this interval not displayed.    Estimated Creatinine Clearance: 11.5 mL/min (A) (by C-G formula based on SCr of 6.18 mg/dL (H)).   Medical History: Past Medical History:  Diagnosis Date   Arthritis    Chest pain    Assessment: Joshua Mora is a 72 y.o. male presenting with AKI. PMH significant for HLD, T2DM. Patient was not on [facility] PTA per chart review. D-dimer 11.94. Chest Xray unrevealing. VQ scan negative. Upper extremity and lower extremity duplex ultrasound did not show DVT. Pharmacy has been consulted to initiate and manage heparin infusion.   Baseline Labs: aPTT 30, PT 18.1, INR 1.5, Hgb 11.9, Hct 34.3, Plt 308   Goal of Therapy:  Heparin level 0.3-0.7 units/ml Monitor platelets by anticoagulation protocol:  Yes  Date Time HL Rate/Comment  8/31 0955 0.24 1350/subtherapeutic 8/31 1955 0.22 1550/subtherapeutic    Plan:  Give 1300 units bolus x 1 Start heparin infusion at 1750 units/hr Check HL in 8 hours  Continue to monitor H&H and platelets daily while on heparin infusion   Celene Squibb, PharmD Clinical Pharmacist 10/15/2022 8:51 PM

## 2022-10-15 NOTE — Procedures (Signed)
Central Venous Catheter Insertion Procedure Note  Joshua Mora  [id number]  1950/12/31  Date:10/15/22  Time:1:19 AM   Provider Performing:Joshua Mora   Procedure: Insertion of Non-tunneled Central Venous [phone]) with US guidance ([id number])   Indication(s) Hemodialysis  Consent Risks of the procedure as well as the alternatives and risks of each were explained to the patient and/or caregiver.  Consent for the procedure was obtained and is signed in the bedside chart  Anesthesia Topical only with 1% lidocaine   Timeout Verified patient identification, verified procedure, site/side was marked, verified correct patient position, special equipment/implants available, medications/allergies/relevant history reviewed, required imaging and test results available.  Sterile Technique Maximal sterile technique including full sterile barrier drape, hand hygiene, sterile gown, sterile gloves, mask, hair covering, sterile ultrasound probe cover (if used).  Procedure Description Area of catheter insertion was cleaned with chlorhexidine and draped in sterile fashion.  With real-time ultrasound guidance a central venous catheter was placed into the right internal jugular vein. Nonpulsatile blood flow and easy flushing noted in all ports.  The catheter was sutured in place and sterile dressing applied.  Complications/Tolerance None; patient tolerated the procedure well. Chest X-ray is ordered to verify placement for internal jugular or subclavian cannulation.   Chest x-ray is not ordered for femoral cannulation.  EBL Minimal  Specimen(s) None   Joshua Silversmith, DNP, CCRN, FNP-C, AGACNP-BC Acute Care & Family Nurse Practitioner  [facility]  See Amion for personal pager PCCM on call pager [phone] until 7 am

## 2022-10-15 NOTE — Evaluation (Signed)
Physical Therapy Evaluation Patient Details Name: Joshua Mora MRN: [id number] DOB: [birth year] Today's Date: 10/15/2022  History of Present Illness  Pt is a 72 year old presenting to the ED with progressive weakness and frequent falls; admitted with AKI, leukocytosis  PMH significant for osteoarthritis s/p left knee replacement. Pt found to have L fibular head fx.  Clinical Impression  The pt is presenting this session with diminished tolerance to activity. He was able to participate in achieving edge of bed, however requires cueing and Max A in order to do so. The pt has increased alertness and attention to activity once edge of bed, however, his tolerance diminishes quickly. He was able to sit EOB x10 min with intermittent ability to demonstrate static sitting with Supervision. The pt will require skilled PT in order to optimize functional mobility and address functional impairments.          If plan is discharge home, recommend the following: Two people to help with walking and/or transfers;Two people to help with bathing/dressing/bathroom;Help with stairs or ramp for entrance;Assist for transportation   Can travel by private vehicle   No    Equipment Recommendations    Recommendations for Other Services       Functional Status Assessment Patient has had a recent decline in their functional status and demonstrates the ability to make significant improvements in function in a reasonable and predictable amount of time.     Precautions / Restrictions Precautions Precautions: Fall Precaution Comments: no restrictions per nursing orders, pt has home/personal L knee brace on per chart provided from [facility]; team notified Restrictions Weight Bearing Restrictions: Yes LLE Weight Bearing: Weight bearing as tolerated      Mobility  Bed Mobility Overal bed mobility: Needs Assistance Bed Mobility: Supine to Sit, Sit to Supine Rolling: Max assist, +2 for physical assistance    Supine to sit: Max assist, HOB elevated, Used rails Sit to supine: Max assist, +2 for physical assistance        Transfers                   General transfer comment: Patient's level of arousal limited progression towards transfers this session    Ambulation/Gait                  Stairs            Wheelchair Mobility     Tilt Bed    Modified Rankin (Stroke Patients Only)       Balance Overall balance assessment: Needs assistance Sitting-balance support: Feet supported, Bilateral upper extremity supported Sitting balance-Leahy Scale: Fair Sitting balance - Comments: Patient intermittently demonstrating supervision for static sitting but mostly required Min-Mod A pending arousal Postural control: Posterior lean                                   Pertinent Vitals/Pain Pain Assessment Pain Assessment: CPOT Facial Expression: Relaxed, neutral Body Movements: Absence of movements Muscle Tension: Tense, rigid Vocalization (extubated pts.): Talking in normal tone or no sound    Home Living   Living Arrangements: Children;Other relatives Available Help at Discharge: Family Type of Home: House Home Access: Ramped entrance   Entrance Stairs-Number of Steps: through garage   Home Layout: One level Home Equipment: Agricultural consultant (2 wheels);Grab bars - tub/shower;Shower seat - built in;Shower seat;Hand held shower head Additional Comments: sleeps in a twin bed  Prior Function Prior Level of Function : History of Falls (last six months);Driving             Mobility Comments: amb with a RW since July due to falls, 6-7 reported falls since July 4, walk to mailbox ADLs Comments: MOD I with ADL/IADL, can cook/clean, doesnt normally     Extremity/Trunk Assessment   Upper Extremity Assessment Upper Extremity Assessment: Generalized weakness    Lower Extremity Assessment Lower Extremity Assessment: Generalized weakness LLE  Deficits / Details: L knee with personal knee brace donned, readjusted by this thearpist; noted open area on knee from pt reported fall at home on knee; swelling throughoutL ankle LLE Sensation: WNL       Communication   Communication Communication: Difficulty communicating thoughts/reduced clarity of speech Following commands: Follows one step commands inconsistently;Follows one step commands with increased time  Cognition Arousal: Lethargic Behavior During Therapy: Flat affect Overall Cognitive Status: Impaired/Different from baseline (Family noted that the patient's personally shines through intermittently during session.) Area of Impairment: Attention, Orientation                                        General Comments      Exercises     Assessment/Plan    PT Assessment Patient needs continued PT services  PT Problem List Decreased strength;Decreased balance;Decreased activity tolerance;Decreased mobility       PT Treatment Interventions Therapeutic activities;Therapeutic exercise;Gait training;Balance training;Functional mobility training;Neuromuscular re-education;Patient/family education    PT Goals (Current goals can be found in the Care Plan section)  Acute Rehab PT Goals Patient Stated Goal: Patient unable to participate in the goal setting process. PT Goal Formulation: Patient unable to participate in goal setting Time For Goal Achievement: 10/29/22 Potential to Achieve Goals: Fair    Frequency Min 1X/week     Co-evaluation               AM-PAC PT "6 Clicks" Mobility  Outcome Measure Help needed turning from your back to your side while in a flat bed without using bedrails?: A Lot Help needed moving from lying on your back to sitting on the side of a flat bed without using bedrails?: A Lot Help needed moving to and from a bed to a chair (including a wheelchair)?: Total Help needed standing up from a chair using your arms (e.g.,  wheelchair or bedside chair)?: A Lot Help needed to walk in hospital room?: Total Help needed climbing 3-5 steps with a railing? : Total 6 Click Score: 9    End of Session   Activity Tolerance: Patient tolerated treatment well;Patient limited by lethargy;Patient limited by fatigue Patient left: in bed;with nursing/sitter in room Nurse Communication: Mobility status PT Visit Diagnosis: Muscle weakness (generalized) (M62.81);Repeated falls (R29.6);Difficulty in walking, not elsewhere classified (R26.2)    Time: 6578-4696 PT Time Calculation (min) (ACUTE ONLY): 25 min   Charges:   PT Evaluation $PT Eval Moderate Complexity: 1 Mod PT Treatments $Therapeutic Activity: 8-22 mins PT General Charges $$ ACUTE PT VISIT: 1 Visit         1:00 PM, 10/15/22 Sonya Pucci A. Mordecai Maes PT, DPT Physical Therapist - [facility] A Flynn Lininger 10/15/2022, 12:54 PM

## 2022-10-15 NOTE — Progress Notes (Signed)
[facility], [place] 10/15/22  Subjective:   Hospital day # 2  Remains critically ill.  Sister at bedside. Urine output remains low.  275 cc recorded from yesterday.  He now has a internal jugular temp cath. Renal: 08/30 0701 - 08/31 0700 In: 1534.1 [I.V.:1434.1; IV Piggyback:100] Out: 275 [Urine:275] Lab Results  Component Value Date   CREATININE 6.18 (H) 10/15/2022   CREATININE 6.43 (H) 10/14/2022   CREATININE 6.55 (H) 10/13/2022     Objective:  Vital signs in last 24 hours:  Temp:  [97.9 F (36.6 C)-101.3 F (38.5 C)] 98.1 F (36.7 C) (08/31 0400) Pulse Rate:  [42-135] 86 (08/31 0800) Resp:  [15-53] 21 (08/31 0800) BP: (92-154)/(45-113) 133/64 (08/31 0800) SpO2:  [89 %-100 %] 93 % (08/31 0800) Weight:  [87.2 kg] 87.2 kg (08/31 0400)  Weight change: -1.705 kg Filed Weights   10/13/22 2121 10/14/22 0500 10/15/22 0400  Weight: 83.3 kg 84.2 kg 87.2 kg    Intake/Output:    Intake/Output Summary (Last 24 hours) at 10/15/2022 1610 Last data filed at 10/15/2022 0700 Gross per 24 hour  Intake 1414.52 ml  Output 175 ml  Net 1239.52 ml     Physical Exam: General: Critically ill-appearing  HEENT Dry oral mucous membranes  Pulm/lungs [place] O2  CVS/Heart Irregular  Abdomen:  Soft, mildly distended, nontender  Extremities: Dependent edema present  Neurologic: Alert, responds to few simple commands  Skin: Warm  Access: Right IJ temp cath       Basic Metabolic Panel:  Recent Labs  Lab 10/13/22 1505 10/13/22 1844 10/13/22 2102 10/14/22 0234 10/15/22 0129  NA 138  --  144 144 140  K 4.2  --  4.6 3.4* 3.9  CL 116*  --  111 106 104  CO2 10*  --  15* 18* 24  GLUCOSE 102*  --  106* 116* 131*  BUN 72*  --  75* 77* 80*  CREATININE 6.30* 6.31* 6.55* 6.43* 6.18*  CALCIUM 8.9  --  8.7* 9.0 8.1*  MG  --   --   --   --  1.8  PHOS  --   --   --   --  4.2     CBC: Recent Labs  Lab 10/13/22 1555 10/13/22 1844 10/13/22 2102  10/14/22 0234 10/15/22 0127  WBC 46.7* 45.3* 27.8* 49.9* 44.9*  NEUTROABS  --  39.3* 25.6*  --  37.8*  HGB 13.5 13.2 13.1 11.9* 11.0*  HCT 40.4 40.1 40.1 34.3* 31.9*  MCV 92.4 91.8 93.3 88.6 90.1  PLT 384 371 310 308 234      Lab Results  Component Value Date   HEPBSAG NON REACTIVE 10/14/2022      Microbiology:  Recent Results (from the past 240 hour(s))  SARS Coronavirus 2 by RT PCR (hospital order, performed in [facility] hospital lab) *cepheid single result test* Anterior Nasal Swab     Status: None   Collection Time: 10/13/22  3:55 PM   Specimen: Anterior Nasal Swab  Result Value Ref Range Status   SARS Coronavirus 2 by RT PCR NEGATIVE NEGATIVE Final    Comment: (NOTE) SARS-CoV-2 target nucleic acids are NOT DETECTED.  The SARS-CoV-2 RNA is generally detectable in upper and lower respiratory specimens during the acute phase of infection. The lowest concentration of SARS-CoV-2 viral copies this assay can detect is 250 copies / mL. A negative result does not preclude SARS-CoV-2 infection and should not be used as the sole basis for treatment  or other patient management decisions.  A negative result may occur with improper specimen collection / handling, submission of specimen other than nasopharyngeal swab, presence of viral mutation(s) within the areas targeted by this assay, and inadequate number of viral copies (<250 copies / mL). A negative result must be combined with clinical observations, patient history, and epidemiological information.  Fact Sheet for Patients:   RoadLapTop.co.za  Fact Sheet for Healthcare Providers: http://kim-miller.com/  This test is not yet approved or  cleared by the [place] FDA and has been authorized for detection and/or diagnosis of SARS-CoV-2 by FDA under an Emergency Use Authorization (EUA).  This EUA will remain in effect (meaning this test can be used) for the duration of  the COVID-19 declaration under Section 564(b)(1) of the Act, 21 U.S.C. section 360bbb-3(b)(1), unless the authorization is terminated or revoked sooner.  Performed at [facility], [address] [place], [place] [address]   MRSA Next Gen by PCR, Nasal     Status: None   Collection Time: 10/13/22  9:51 PM   Specimen: Nasal Mucosa; Nasal Swab  Result Value Ref Range Status   MRSA by PCR Next Gen NOT DETECTED NOT DETECTED Final    Comment: (NOTE) The GeneXpert MRSA Assay (FDA approved for NASAL specimens only), is one component of a comprehensive MRSA colonization surveillance program. It is not intended to diagnose MRSA infection nor to guide or monitor treatment for MRSA infections. Test performance is not FDA approved in patients less than 11 years old. Performed at [facility], [address] [place], [place] [address]   Culture, blood (Routine X 2) w Reflex to ID Panel     Status: None (Preliminary result)   Collection Time: 10/15/22  1:29 AM   Specimen: Right Antecubital; Blood  Result Value Ref Range Status   Specimen Description RIGHT ANTECUBITAL  Final   Special Requests   Final    BOTTLES DRAWN AEROBIC AND ANAEROBIC Blood Culture adequate volume   Culture   Final    NO GROWTH < 12 HOURS Performed at [facility], [address], [place], [place] [address]    Report Status PENDING  Incomplete    Coagulation Studies: Recent Labs    10/13/22 2102 10/14/22 1601  LABPROT 16.7* 18.1*  INR 1.3* 1.5*    Urinalysis: Recent Labs    10/14/22 0732  COLORURINE YELLOW*  LABSPEC 1.011  PHURINE 6.0  GLUCOSEU NEGATIVE  HGBUR LARGE*  BILIRUBINUR NEGATIVE  KETONESUR NEGATIVE  PROTEINUR 100*  NITRITE POSITIVE*  LEUKOCYTESUR LARGE*      Imaging: DG Chest Port 1 View  Result Date: 10/15/2022 CLINICAL DATA:  Central line placement EXAM: PORTABLE CHEST 1 VIEW COMPARISON:  10/14/2022 FINDINGS: Right internal jugular dialysis catheter in  place with the tip in the right atrium. No pneumothorax. Vascular congestion. Interstitial prominence could reflect interstitial edema. Heart mediastinal contours within normal limits. No visible effusions. IMPRESSION: Right hilus catheter placement with the tip in the right atrium. No pneumothorax. Vascular congestion. Diffuse interstitial prominence could reflect interstitial edema. Electronically Signed   By: Charlett Nose M.D.   On: 10/15/2022 01:29   US Venous Img Upper Uni Left (DVT)  Result Date: 10/14/2022 CLINICAL DATA:  Left arm swelling EXAM: LEFT UPPER EXTREMITY VENOUS DOPPLER ULTRASOUND TECHNIQUE: Gray-scale sonography with graded compression, as well as color Doppler and duplex ultrasound were performed to evaluate the upper extremity deep venous system from the level of the subclavian vein and including the jugular, axillary,  basilic, radial, ulnar and upper cephalic vein. Spectral Doppler was utilized to evaluate flow at rest and with distal augmentation maneuvers. COMPARISON:  None Available. FINDINGS: Contralateral Subclavian Vein: Respiratory phasicity is normal and symmetric with the symptomatic side. No evidence of thrombus. Normal compressibility. Internal Jugular Vein: No evidence of thrombus. Normal compressibility, respiratory phasicity and response to augmentation. Subclavian Vein: No evidence of thrombus. Normal compressibility, respiratory phasicity and response to augmentation. Axillary Vein: No evidence of thrombus. Normal compressibility, respiratory phasicity and response to augmentation. Cephalic Vein: No evidence of thrombus. Normal compressibility, respiratory phasicity and response to augmentation. Basilic Vein: No evidence of thrombus. Normal compressibility, respiratory phasicity and response to augmentation. Brachial Veins: No evidence of thrombus. Normal compressibility, respiratory phasicity and response to augmentation. Radial Veins: No evidence of thrombus. Normal  compressibility, respiratory phasicity and response to augmentation. Ulnar Veins: No evidence of thrombus. Normal compressibility, respiratory phasicity and response to augmentation. Other Findings:  None visualized. IMPRESSION: 1. No evidence of deep venous thrombosis within the left upper extremity. Electronically Signed   By: Sharlet Salina M.D.   On: 10/14/2022 16:56   DG Chest Port 1 View  Result Date: 10/14/2022 CLINICAL DATA:  Hypoxic respiratory failure EXAM: PORTABLE CHEST 1 VIEW COMPARISON:  Chest radiograph 1 day prior FINDINGS: The cardiomediastinal silhouette is stable. A calcified right lower paratracheal lymph node is unchanged. Probable mild pulmonary interstitial edema is unchanged. There is no new or worsening focal airspace disease. There is no pleural effusion or pneumothorax There is no acute osseous abnormality. IMPRESSION: Unchanged mild pulmonary edema. Electronically Signed   By: Lesia Hausen M.D.   On: 10/14/2022 16:02   ECHOCARDIOGRAM COMPLETE  Result Date: 10/14/2022    ECHOCARDIOGRAM REPORT   Patient Name:   DAYRON DIETERT Date of Exam: 10/14/2022 Medical Rec #:  [id number]            Height:       69.0 in Accession #:    [id number]           Weight:       185.6 lb Date of Birth:  22-Jan-1951            BSA:          2.002 m Patient Age:    72 years             BP:           120/61 mmHg Patient Gender: M                    HR:           107 bpm. Exam Location:  [facility] Procedure: 2D Echo, Cardiac Doppler, Color Doppler and Intracardiac            Opacification Agent Indications:     Elevated Troponin  History:         Patient has no prior history of Echocardiogram examinations.                  Signs/Symptoms:Chest Pain; Risk Factors:Diabetes, Dyslipidemia                  and Current Smoker.  Sonographer:     Mikki Harbor Referring Phys:  [id number] Oliver Pila HALL Diagnosing Phys: Julien Nordmann MD  Sonographer Comments: Technically difficult study due to poor echo windows.  Image acquisition challenging due to respiratory motion. IMPRESSIONS  1. Left ventricular ejection fraction, by estimation, is 60 to 65%. The  left ventricle has normal function. The left ventricle has no regional wall motion abnormalities. There is mild left ventricular hypertrophy. Left ventricular diastolic parameters are consistent with Grade I diastolic dysfunction (impaired relaxation).  2. Right ventricular systolic function is normal. The right ventricular size is normal. Tricuspid regurgitation signal is inadequate for assessing PA pressure.  3. The mitral valve is normal in structure. No evidence of mitral valve regurgitation. No evidence of mitral stenosis.  4. The aortic valve has an indeterminant number of cusps. Aortic valve regurgitation is mild. Aortic valve sclerosis is present, with no evidence of aortic valve stenosis.  5. The inferior vena cava is normal in size with greater than 50% respiratory variability, suggesting right atrial pressure of 3 mmHg. FINDINGS  Left Ventricle: Left ventricular ejection fraction, by estimation, is 60 to 65%. The left ventricle has normal function. The left ventricle has no regional wall motion abnormalities. Definity contrast agent was given IV to delineate the left ventricular  endocardial borders. The left ventricular internal cavity size was normal in size. There is mild left ventricular hypertrophy. Left ventricular diastolic parameters are consistent with Grade I diastolic dysfunction (impaired relaxation). Right Ventricle: The right ventricular size is normal. No increase in right ventricular wall thickness. Right ventricular systolic function is normal. Tricuspid regurgitation signal is inadequate for assessing PA pressure. Left Atrium: Left atrial size was normal in size. Right Atrium: Right atrial size was normal in size. Pericardium: There is no evidence of pericardial effusion. Mitral Valve: The mitral valve is normal in structure. No evidence of mitral  valve regurgitation. No evidence of mitral valve stenosis. MV peak gradient, 4.5 mmHg. The mean mitral valve gradient is 2.0 mmHg. Tricuspid Valve: The tricuspid valve is normal in structure. Tricuspid valve regurgitation is mild . No evidence of tricuspid stenosis. Aortic Valve: The aortic valve has an indeterminant number of cusps. Aortic valve regurgitation is mild. Aortic valve sclerosis is present, with no evidence of aortic valve stenosis. Aortic valve mean gradient measures 5.0 mmHg. Aortic valve peak gradient measures 11.2 mmHg. Aortic valve area, by VTI measures 3.30 cm. Pulmonic Valve: The pulmonic valve was normal in structure. Pulmonic valve regurgitation is not visualized. No evidence of pulmonic stenosis. Aorta: The aortic root is normal in size and structure. Venous: The inferior vena cava is normal in size with greater than 50% respiratory variability, suggesting right atrial pressure of 3 mmHg. IAS/Shunts: No atrial level shunt detected by color flow Doppler.  LEFT VENTRICLE PLAX 2D LVIDd:         4.40 cm   Diastology LVIDs:         2.90 cm   LV e' medial:    7.83 cm/s LV PW:         1.20 cm   LV E/e' medial:  10.2 LV IVS:        1.20 cm   LV e' lateral:   8.38 cm/s LVOT diam:     2.20 cm   LV E/e' lateral: 9.6 LV SV:         77 LV SV Index:   39 LVOT Area:     3.80 cm  RIGHT VENTRICLE RV Basal diam:  2.95 cm RV Mid diam:    2.90 cm RV S prime:     18.80 cm/s LEFT ATRIUM           Index        RIGHT ATRIUM           Index LA  diam:      2.90 cm 1.45 cm/m   RA Area:     12.20 cm LA Vol (A4C): 46.8 ml 23.38 ml/m  RA Volume:   27.00 ml  13.49 ml/m  AORTIC VALVE                    PULMONIC VALVE AV Area (Vmax):    2.94 cm     PV Vmax:       1.00 m/s AV Area (Vmean):   3.49 cm     PV Peak grad:  4.0 mmHg AV Area (VTI):     3.30 cm AV Vmax:           167.00 cm/s AV Vmean:          97.100 cm/s AV VTI:            0.234 m AV Peak Grad:      11.2 mmHg AV Mean Grad:      5.0 mmHg LVOT Vmax:          129.00 cm/s LVOT Vmean:        89.100 cm/s LVOT VTI:          0.203 m LVOT/AV VTI ratio: 0.87  AORTA Ao Root diam: 3.20 cm MITRAL VALVE MV Area (PHT): 3.46 cm     SHUNTS MV Area VTI:   4.06 cm     Systemic VTI:  0.20 m MV Peak grad:  4.5 mmHg     Systemic Diam: 2.20 cm MV Mean grad:  2.0 mmHg MV Vmax:       1.06 m/s MV Vmean:      61.0 cm/s MV Decel Time: 219 msec MV E velocity: 80.20 cm/s MV A velocity: 114.00 cm/s MV E/A ratio:  0.70 Julien Nordmann MD Electronically signed by Julien Nordmann MD Signature Date/Time: 10/14/2022/1:31:20 PM    Final    US Venous Img Lower Bilateral (DVT)  Result Date: 10/14/2022 CLINICAL DATA:  10026 Shortness of breath 10026 EXAM: BILATERAL LOWER EXTREMITY VENOUS DOPPLER ULTRASOUND TECHNIQUE: Gray-scale sonography with graded compression, as well as color Doppler and duplex ultrasound were performed to evaluate the lower extremity deep venous systems from the level of the common femoral vein and including the common femoral, femoral, profunda femoral, popliteal and calf veins including the posterior tibial, peroneal and gastrocnemius veins when visible. The superficial great saphenous vein was also interrogated. Spectral Doppler was utilized to evaluate flow at rest and with distal augmentation maneuvers in the common femoral, femoral and popliteal veins. COMPARISON:  Lower extremity XRs, 10/13/2022. FINDINGS: RIGHT LOWER EXTREMITY VENOUS Normal compressibility of the RIGHT common femoral, superficial femoral, and popliteal veins, as well as the visualized calf veins. Visualized portions of profunda femoral vein and great saphenous vein unremarkable. No filling defects to suggest DVT on grayscale or color Doppler imaging. Doppler waveforms show normal direction of venous flow, normal respiratory plasticity and response to augmentation. OTHER No evidence of superficial thrombophlebitis or abnormal fluid collection. Limitations: none LEFT LOWER EXTREMITY VENOUS Normal  compressibility of the LEFT common femoral, superficial femoral, and popliteal veins, as well as the visualized calf veins. Visualized portions of profunda femoral vein and great saphenous vein unremarkable. No filling defects to suggest DVT on grayscale or color Doppler imaging. Doppler waveforms show normal direction of venous flow, normal respiratory plasticity and response to augmentation. OTHER No evidence of superficial thrombophlebitis or abnormal fluid collection. Limitations: none IMPRESSION: No evidence of femoropopliteal DVT or superficial thrombophlebitis within either  lower extremity. Roanna Banning, MD [facility] Electronically Signed   By: Roanna Banning M.D.   On: 10/14/2022 06:21   DG Chest Port 1 View  Result Date: 10/13/2022 CLINICAL DATA:  78295 Acute respiratory distress 66502 EXAM: PORTABLE CHEST 1 VIEW.  Patient is rotated. COMPARISON:  Chest x-ray 10/13/2022, chest x-ray report 05/15/2017 FINDINGS: Slightly more prominent thoracic aorta silhouette likely due to portable AP technique and slight patient rotation. The heart and mediastinal contours are otherwise unchanged. Aortic calcification. Chronic calcified right lower paratracheal region lymph nodes suggestive of prior granulomatous disease. Limited evaluation of the lung apices due to overlying mandible. Increased interstitial markings. No focal consolidation. No pulmonary edema. No pleural effusion. No pneumothorax. No acute osseous abnormality. IMPRESSION: 1. Mild pulmonary edema. 2.  Aortic Atherosclerosis (ICD10-I70.0). Electronically Signed   By: Tish Frederickson M.D.   On: 10/13/2022 21:30   DG Ankle Complete Left  Result Date: 10/13/2022 CLINICAL DATA:  Left ankle pain after multiple falls. EXAM: LEFT ANKLE COMPLETE - 3+ VIEW COMPARISON:  None Available. FINDINGS: There is no evidence of acute fracture, dislocation, or joint effusion. There is no evidence of arthropathy.  Rounded densities are seen inferior to medial malleolus suggesting degenerative change or old injury. Soft tissues are unremarkable. IMPRESSION: No acute abnormality seen. Electronically Signed   By: Lupita Raider M.D.   On: 10/13/2022 18:20   CT HEAD WO CONTRAST ( )  Result Date: 10/13/2022 CLINICAL DATA:  Generalized weakness and multiple falls. EXAM: CT HEAD WITHOUT CONTRAST TECHNIQUE: Contiguous axial images were obtained from the base of the skull through the vertex without intravenous contrast. RADIATION DOSE REDUCTION: This exam was performed according to the departmental dose-optimization program which includes automated exposure control, adjustment of the mA and/or kV according to patient size and/or use of iterative reconstruction technique. COMPARISON:  August 18, 2022 FINDINGS: Brain: There is mild to moderate severity cerebral atrophy with widening of the extra-axial spaces and ventricular dilatation. There are areas of decreased attenuation within the white matter tracts of the supratentorial brain, consistent with microvascular disease changes. 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 Vascular: No hyperdense vessel or unexpected calcification. Skull: A chronic left-sided nasal bone fracture is seen. Sinuses/Orbits: No acute finding. Other: None. IMPRESSION: 1. Generalized cerebral atrophy with chronic white matter small vessel ischemic changes. 2. 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 3. No acute intracranial abnormality. Electronically Signed   By: Aram Candela M.D.   On: 10/13/2022 17:05   US RENAL  Result Date: 10/13/2022 CLINICAL DATA:  Acute renal failure EXAM: RENAL / URINARY TRACT ULTRASOUND COMPLETE COMPARISON:  None Available. FINDINGS: Right Kidney: Renal measurements: 10.0 x 5.1 x 5.3 cm = volume: 139.9 mL. No collecting system dilatation or perinephric fluid. Along the upper pole  of the kidney is a echogenic area along the cortex measuring 15 x 16 x 15 mm this is of uncertain etiology. An underlying lesion is possible Left Kidney: Renal measurements: 10.2 by 6.4 x 4.9 cm = volume: 156.6 mL. Echogenicity within normal limits. No mass or hydronephrosis visualized. Bladder: Bladder is underdistended. Other: None. IMPRESSION: No collecting system dilatation. Ill-defined echogenic area in the upper pole of the right kidney measuring 15 mm. Underlying lesion is not excluded. Dedicated cross-sectional imaging study can be performed when clinically appropriate to confirm presence or absence of the lesion Electronically Signed   By: Piedad Climes.D.  On: 10/13/2022 16:51   DG Chest 2 View  Result Date: 10/13/2022 CLINICAL DATA:  Chest pain, weakness EXAM: CHEST - 2 VIEW COMPARISON:  None Available. FINDINGS: Mild left basilar atelectasis. Right lung is clear. No pleural effusion or pneumothorax. The heart is normal in size.  Thoracic aortic atherosclerosis. Degenerative changes of the visualized thoracolumbar spine. IMPRESSION: No acute cardiopulmonary disease. Electronically Signed   By: Charline Bills M.D.   On: 10/13/2022 15:40     Medications:    cefTRIAXone (ROCEPHIN)  IV Stopped (10/14/22 1710)   heparin 1,350 Units/hr (10/15/22 0700)    vitamin C  500 mg Oral BID   Chlorhexidine Gluconate Cloth  6 each Topical Daily   cyanocobalamin  1,000 mcg Oral Daily   feeding supplement  1 Container Oral TID BM   feeding supplement  237 mL Oral TID BM   folic acid  1 mg Oral Daily   multivitamin with minerals  1 tablet Oral Daily   nicotine  14 mg Transdermal Daily   thiamine  100 mg Oral Daily   Or   thiamine  100 mg Intravenous Daily   Vitamin D (Ergocalciferol)  50,000 Units Oral Q7 days   acetaminophen, LORazepam **OR** LORazepam, melatonin, ondansetron **OR** ondansetron (ZOFRAN) IV, mouth rinse, polyethylene glycol, prochlorperazine, traMADol  Assessment/ Plan:  72  y.o. male with  tobacco use, alcohol abuse, osteoarthritis status post left knee replacement and hyperlipidemia, frequent falls over the past few weeks since July 4 th admitted on 10/13/2022 for AKI (acute kidney injury) (HCC) [N17.9]   Acute Kidney Injury on chronic kidney disease stage IIIB with acute metabolic acidosis  Baseline creatinine of 1.74, GFR of 41 on 08/18/22 Suspect patient's acute kidney injury secondary to NSAID induced nephropathy. No history of diabetes or hypertension per patient.Renal ultrasound with no obstruction. No IV contrast. -Low urine output with significantly elevated BUN and creatinine.  Suspect patient is uremic. -Plan for hemodialysis today. -Reevaluate for ongoing need of hemodialysis on a daily basis.  Sepsis Urinary source suspected. Currently being treated with Rocephin.   LOS: 2 Joshua Mora Thedore Mins 8/31/20248:22 AM  [facility] [place], [place] [phone]  Note: This note was prepared with Dragon dictation. Any transcription errors are unintentional

## 2022-10-15 NOTE — Progress Notes (Signed)
Heparin stopped at 1815 for planned trialysis catheter placement in one hour.

## 2022-10-15 NOTE — Plan of Care (Signed)
Patient remains in [facility]-ICU at time of writing. Plan of care includes HD overnight tonight. Patient has a 3-lumen trialysis cath to [facility]. Patient remains on heparin infusion. Of note, the off-going RN stated the patient's son Fayrene Fearing?) had expressed verbal hostility towards nursing staff throughout the day and had been interfering with care. Per the off-going RN, AM Charge RN had been made aware of the situation.  Edit: 10/16/22 @ 0424 hrs. WBC critical high this AM at 51.4; Dr. Para March notified.  Problem: Education: Goal: Knowledge of General Education information will improve Description: Including pain rating scale, medication(s)/side effects and non-pharmacologic comfort measures Outcome: Progressing   Problem: Health Behavior/Discharge Planning: Goal: Ability to manage health-related needs will improve Outcome: Progressing   Problem: Clinical Measurements: Goal: Ability to maintain clinical measurements within normal limits will improve Outcome: Progressing Goal: Will remain free from infection Outcome: Progressing Goal: Diagnostic test results will improve Outcome: Progressing Goal: Respiratory complications will improve Outcome: Progressing Goal: Cardiovascular complication will be avoided Outcome: Progressing   Problem: Activity: Goal: Risk for activity intolerance will decrease Outcome: Progressing   Problem: Nutrition: Goal: Adequate nutrition will be maintained Outcome: Progressing   Problem: Coping: Goal: Level of anxiety will decrease Outcome: Progressing   Problem: Elimination: Goal: Will not experience complications related to bowel motility Outcome: Progressing Goal: Will not experience complications related to urinary retention Outcome: Progressing   Problem: Pain Managment: Goal: General experience of comfort will improve Outcome: Progressing   Problem: Safety: Goal: Ability to remain free from injury will improve Outcome: Progressing   Problem: Skin  Integrity: Goal: Risk for impaired skin integrity will decrease Outcome: Progressing

## 2022-10-15 NOTE — Progress Notes (Signed)
PT Cancellation Note  Patient Details Name: Joshua Mora MRN: [id number] DOB: 14-Feb-1951   Cancelled Treatment:    Reason Eval/Treat Not Completed: Patient at procedure or test/unavailable Will follow up later in the day.    Trypp Heckmann A Preeya Cleckley 10/15/2022, 11:31 AM

## 2022-10-16 DIAGNOSIS — J9601 Acute respiratory failure with hypoxia: Secondary | ICD-10-CM | POA: Diagnosis not present

## 2022-10-16 DIAGNOSIS — A419 Sepsis, unspecified organism: Secondary | ICD-10-CM | POA: Diagnosis not present

## 2022-10-16 DIAGNOSIS — T796XXA Traumatic ischemia of muscle, initial encounter: Secondary | ICD-10-CM

## 2022-10-16 DIAGNOSIS — N170 Acute kidney failure with tubular necrosis: Secondary | ICD-10-CM

## 2022-10-16 DIAGNOSIS — D72825 Bandemia: Secondary | ICD-10-CM | POA: Diagnosis not present

## 2022-10-16 DIAGNOSIS — R7989 Other specified abnormal findings of blood chemistry: Secondary | ICD-10-CM | POA: Diagnosis present

## 2022-10-16 LAB — APTT: aPTT: 65 s — ABNORMAL HIGH (ref 24–36)

## 2022-10-16 LAB — URINE CULTURE: Culture: >=100000 — AB

## 2022-10-16 LAB — CBC
HCT: 30.7 % — ABNORMAL LOW (ref 39.0–52.0)
Hemoglobin: 11 g/dL — ABNORMAL LOW (ref 13.0–17.0)
MCH: 31.5 pg (ref 26.0–34.0)
MCHC: 35.8 g/dL (ref 30.0–36.0)
MCV: 88 fL (ref 80.0–100.0)
Platelets: 186 10*3/uL (ref 150–400)
RBC: 3.49 MIL/uL — ABNORMAL LOW (ref 4.22–5.81)
RDW: 15.9 % — ABNORMAL HIGH (ref 11.5–15.5)
WBC: 51.4 10*3/uL (ref 4.0–10.5)
nRBC: 0.3 % — ABNORMAL HIGH (ref 0.0–0.2)

## 2022-10-16 LAB — BASIC METABOLIC PANEL
Anion gap: 14 (ref 5–15)
BUN: 67 mg/dL — ABNORMAL HIGH (ref 8–23)
CO2: 23 mmol/L (ref 22–32)
Calcium: 8.2 mg/dL — ABNORMAL LOW (ref 8.9–10.3)
Chloride: 101 mmol/L (ref 98–111)
Creatinine, Ser: 4.32 mg/dL — ABNORMAL HIGH (ref 0.61–1.24)
GFR, Estimated: 14 mL/min — ABNORMAL LOW (ref >60)
Glucose, Bld: 121 mg/dL — ABNORMAL HIGH (ref 70–99)
Potassium: 3.8 mmol/L (ref 3.5–5.1)
Sodium: 138 mmol/L (ref 135–145)

## 2022-10-16 LAB — HEPARIN LEVEL (UNFRACTIONATED): Heparin Unfractionated: 0.27 [IU]/mL — ABNORMAL LOW (ref 0.30–0.70)

## 2022-10-16 LAB — PROCALCITONIN: Procalcitonin: 38.7 ng/mL

## 2022-10-16 LAB — MICROALBUMIN / CREATININE URINE RATIO
Creatinine, Urine: 81.5 mg/dL
Microalb Creat Ratio: 589 mg/g{creat} — ABNORMAL HIGH (ref 0–29)
Microalb, Ur: 480.1 ug/mL — ABNORMAL HIGH

## 2022-10-16 LAB — PROTIME-INR
INR: 1.4 — ABNORMAL HIGH (ref 0.8–1.2)
Prothrombin Time: 17.1 s — ABNORMAL HIGH (ref 11.4–15.2)

## 2022-10-16 LAB — LACTATE DEHYDROGENASE: LDH: 355 U/L — ABNORMAL HIGH (ref 98–192)

## 2022-10-16 LAB — FIBRINOGEN: Fibrinogen: >800 mg/dL — ABNORMAL HIGH (ref 210–475)

## 2022-10-16 LAB — MAGNESIUM: Magnesium: 1.8 mg/dL (ref 1.7–2.4)

## 2022-10-16 LAB — GLUCOSE, CAPILLARY: Glucose-Capillary: 122 mg/dL — ABNORMAL HIGH (ref 70–99)

## 2022-10-16 LAB — PHOSPHORUS: Phosphorus: 3.6 mg/dL (ref 2.5–4.6)

## 2022-10-16 LAB — FERRITIN: Ferritin: 315 ng/mL (ref 24–336)

## 2022-10-16 MED ORDER — HEPARIN BOLUS VIA INFUSION
1300.0000 [IU] | Freq: Once | INTRAVENOUS | Status: AC
  Administered 2022-10-16: 1300 [IU] via INTRAVENOUS
  Filled 2022-10-16: qty 1300

## 2022-10-16 MED ORDER — VANCOMYCIN VARIABLE DOSE PER UNSTABLE RENAL FUNCTION (PHARMACIST DOSING)
Status: DC
  Filled 2022-10-16: qty 1

## 2022-10-16 MED ORDER — HEPARIN SODIUM (PORCINE) 5000 UNIT/ML IJ SOLN
5000.0000 [IU] | Freq: Three times a day (TID) | INTRAMUSCULAR | Status: DC
  Administered 2022-10-16 – 2022-11-04 (×51): 5000 [IU] via SUBCUTANEOUS
  Filled 2022-10-16 (×53): qty 1

## 2022-10-16 MED ORDER — CHLORPROMAZINE HCL 10 MG PO TABS
10.0000 mg | ORAL_TABLET | Freq: Three times a day (TID) | ORAL | Status: DC | PRN
  Administered 2022-10-16: 10 mg via ORAL
  Filled 2022-10-16 (×2): qty 1

## 2022-10-16 MED ORDER — VANCOMYCIN HCL 1750 MG/350ML IV SOLN
1750.0000 mg | Freq: Once | INTRAVENOUS | Status: AC
  Administered 2022-10-16: 1750 mg via INTRAVENOUS
  Filled 2022-10-16: qty 350

## 2022-10-16 NOTE — Progress Notes (Signed)
Progress Note   Patient: Joshua Mora LKG:[id number] DOB: 1950/06/24 DOA: 10/13/2022     3 DOS: the patient was seen and examined on 10/16/2022   Brief hospital course: Taken from prior notes.  Joshua Mora is a 72 y.o. male with medical history significant of alcohol abuse, tobacco use disorder, diet controlled prediabetes, CKD 3B, osteoarthritis s/p left knee replacement who presented to the ED with complaints of progressive weakness and frequent falls over the past 1 month.     On presentation he was found to have severely elevated creatinine above baseline for which nephrology was consulted.  Also with significant leukocytosis above 46,000 with concern for leukemoid reaction in the setting of presumptive UTI.  Trauma imaging were nonacute.  CT head also nonacute.  Hospital course was complicated by acute hypoxic respiratory failure.  Was found to have mild pulmonary edema on chest x-ray.  Elevated D-dimer above 11 with concern for possible pulmonary embolism.  Heparin drip initiated.  9/1: Overnight became febrile at 100.7.  Urine cultures with Klebsiella pneumonia which are sensitive to ceftriaxone.  Had bilateral knee injuries which does not appear infected.  Worsening leukocytosis.  VQ scan negative for PE, lower extremity venous Doppler negative for DVT, discontinuing heparin infusion.  Adding vancomycin to broaden up the coverage.  Also concern of Leukomed reaction versus any underlying undiagnosed leukemia-hematology was consulted. Also ordered labs to rule out DIC due to worsening D-dimer.  Assessment and Plan: * Acute renal failure superimposed on stage 3b chronic kidney disease (HCC) Patient with poor p.o. intake, BUN of 72 and creatinine of 6.30 on admission, it was 1.74 early July when he was first seen with fall. Renal ultrasound negative for any obstruction, noted an ill-defined echogenic area in the right upper pole of kidney which can be followed up later on with  designated CT scan. Patient was also using NSAID regularly. -Nephrology consult-patient was started on dialysis yesterday, nephrology will monitor closely day by day to determine further need at this time -Monitor renal function -Avoid nephrotoxins  Severe sepsis [facility]) Patient met sepsis criteria with tachycardia, severe leukocytosis and severe sepsis with lactic acidosis above 4.3.  Initial procalcitonin was 60.67 which is decreasing. Did develop a low-grade fever overnight again.  Worsening leukocytosis.  Procalcitonin improving.  Preliminary blood cultures negative, urine cultures with Klebsiella pneumonia which shows only resistance to ampicillin and Augmentin.  Patient did received ceftriaxone for the past 3 days. -Continue ceftriaxone -Add vancomycin -Continue to monitor  Leukocytosis Worsening leukocytosis, initial smear review with bandemia.  Urine cultures with Klebsiella pneumonia but patient is being treated with appropriate antibiotics. Significantly elevated D-dimer which continued to get worse.  VTE has been ruled out with negative VQ scan and lower extremity venous Doppler. -Broadening of coverage by adding vancomycin-had bilateral knee wounds -Hematology consult.  Elevated d-dimer Patient with worsening D-dimer, now at 13.  COVID was negative. DVT has been ruled out with negative VQ scan and lower extremity venous Doppler. -Stop heparin infusion -Checking labs for DIC -Continue to monitor  Acute hypoxemic respiratory failure (HCC) Patient did develop acute hypoxic respiratory failure and repeat chest x-ray at that time with concern of pulmonary congestion.  Echocardiogram with normal EF and grade 1 diastolic dysfunction.  VQ scan negative for PE Patient with significant AKI, also concern of NSAID induced renal injury. Started on dialysis. -Continue IV Lasix -Continue supplemental oxygen-wean as tolerated  Frequent falls Patient with history of recent decline, poor  appetite and multiple falls.  First fall was on July 4 and since then he was falling couple of times a week.  All imaging negative for any acute fractures or injury. B12 normal, significantly low vitamin D-started on supplement. PT/OT are recommending SNF  Nicotine dependence, uncomplicated -Nicotine patch  Traumatic rhabdomyolysis (HCC) Likely secondary to recurrent falls.      Subjective: Patient was seen and examined today.  No new concern.  Developed some fever overnight.  Denies any specific pain.  Started on dialysis yesterday.  Physical Exam: Vitals:   10/16/22 1100 10/16/22 1141 10/16/22 1200 10/16/22 1300  BP: 103/60  111/60 (!) 91/57  Pulse: 87 89 86 83  Resp: 18 19 18  (!) 22  Temp:  (!) 97.5 F (36.4 C)    TempSrc:  Oral    SpO2: 97% 96% 94% 95%  Weight:      Height:       General.  Chronically ill-appearing elderly man, in no acute distress. Pulmonary.  Lungs clear bilaterally, normal respiratory effort. CV.  Regular rate and rhythm, no JVD, rub or murmur. Abdomen.  Soft, nontender, nondistended, BS positive. CNS.  Alert and oriented .  No focal neurologic deficit. Extremities.  No edema, no cyanosis, pulses intact and symmetrical.  Bilateral knee injuries, right with some staples, no obvious edema. Psychiatry.  Judgment and insight appears normal.   Data Reviewed: Prior data reviewed  Family Communication: Discussed with daughter at bedside  Disposition: Status is: Inpatient Remains inpatient appropriate because: Severity of illness  Planned Discharge Destination: Skilled nursing facility  DVT prophylaxis.  Subcu heparin Time spent: 55 minutes  This record has been created using Conservation officer, historic buildings. Errors have been sought and corrected,but may not always be located. Such creation errors do not reflect on the standard of care.   Author: Arnetha Courser, MD 10/16/2022 1:58 PM  For on call review [email or web address].

## 2022-10-16 NOTE — Assessment & Plan Note (Addendum)
Patient with worsening D-dimer, now at 13.  COVID was negative. DVT has been ruled out with negative VQ scan and lower extremity venous Doppler. -Stop heparin infusion -Checking labs for DIC -Continue to monitor

## 2022-10-16 NOTE — Consult Note (Addendum)
Pharmacy Antibiotic Note  Joshua Mora is a 72 y.o. male admitted on 10/13/2022 with acute renal failure.  Pharmacy has been consulted for vancomycin dosing for wound infection.  Scr 4.32 (BL 1.74)  Plan: Give vancomycin 1750 mg IV x 1 Due to AKI will dose based on levels until renal function stabilizes. Vancomycin random ordered for 10/18/22 @ 0500 Patient on ceftriaxone 2 grams IV every 24 hours per provider   Height: 5\' 9"  (175.3 cm) Weight: 86.3 kg (190 lb 4.1 oz) IBW/kg (Calculated) : 70.7  Temp (24hrs), Avg:98.4 F (36.9 C), Min:97.4 F (36.3 C), Max:100.7 F (38.2 C)  Recent Labs  Lab 10/13/22 1844 10/13/22 2102 10/14/22 0006 10/14/22 0234 10/14/22 0713 10/14/22 1309 10/15/22 0127 10/15/22 0129 10/15/22 0955 10/16/22 0347  WBC 45.3* 27.8*  --  49.9*  --   --  44.9*  --   --  51.4*  CREATININE 6.31* 6.55*  --  6.43*  --   --   --  6.18*  --  4.32*  LATICACIDVEN  --  5.4*   < > 2.7* 3.1* 4.3*  --  1.1 1.3  --    < > = values in this interval not displayed.    Estimated Creatinine Clearance: 16.3 mL/min (A) (by C-G formula based on SCr of 4.32 mg/dL (H)).    No Known Allergies  Antimicrobials this admission: ceftriaxone 8/30 >>  vancomycin 9/1 >>   Dose adjustments this admission: N/A  Microbiology results: 8/31 BCx: ngtd 8/30 UCx: Klebsiella pneumoniae (R = amp, macrobid)  8/29 MRSA PCR: not detected  Thank you for allowing pharmacy to be a part of this patient's care.  Barrie Folk, PharmD 10/16/2022 12:37 PM

## 2022-10-16 NOTE — Assessment & Plan Note (Signed)
Likely secondary to recurrent falls.

## 2022-10-16 NOTE — Consult Note (Signed)
[facility]  [phone] [phone]  ID: Joshua Mora OB: October 12, 1950  MR#: [id number]  VOZ#:[id number]  Patient Care Team: Sandrea Hughs, NP as PCP - General (Nurse Practitioner)  REFERRING PROVIDER: Dr. Nelson Chimes  REASON FOR REFERRAL: leucocytosis  ASSESSMENT AND PLAN:   Joshua Mora is a 72 y.o. male with pmh of CKD stage III, alcohol use presented to ED follow-up with progressive weakness and falls.Marland Kitchen  He was found to have AKI on CKD started on dialysis.Hospitalization complicated by fevers and Klebsiella UTI. Hematology consulted for persistent leucocytosis.   # Leucocytosis  - On presentation 10/13/2022 WBC 46.7 predominantly neutrophilia.  More than 20% bands. WBC on 10/16/22 51.4, ANC 39.3, Monocytes 3.0. mild left shift with 1-5% metamyelocytes and occasional myelocytes. No blasts noted. Monocytes fluctuating between normal and 3.0.   - CBC with diff from 08/18/2022 was normal, WBC 10.1, Hb 13 and normal plts - 10/16/22 - WBC 51.4 - 10/17/22- WBC improved to 37, ANC 30.6, monocyte 3.5  - Leucocytosis could be reactive from the underlying acute process (WBC a month ago was completely normal). WBC trending down today. I will obtain flow cytometry, BCR/ ABL FISH, ESR and CRP.  - Daily CBC with diff. If leucocytosis persists, may need bone marrow biopsy to further evaluate.   # Sepsis secondary to Klebsiella UTI - on ceftriaxone.   #AKI on CKD - likely secondary to NSAIDs, and rhabdomyolysis  - SPEP/IFE and kappa/lambda pending - Dialysis per Nephro   # Mild anemia  - Hb normal 13.5 on presentation. B12 normal.  - ferritin 31.5 Add iron panel  - Myeloma panel pending   Patient expressed understanding and was in agreement with this plan. He also understands that He can call clinic at any time with any questions, concerns, or complaints.   I spent a total of 60 minutes reviewing chart data, face-to-face evaluation with the patient,  counseling and coordination of care as detailed above.  HPI: Joshua Mora is a 72 y.o. male with pmh of CKD stage III, alcohol use presented to ED follow-up with progressive weakness and falls.Marland Kitchen  He was found to have AKI on CKD started on dialysis.  Renal ultrasound negative for obstruction.  Likely secondary from NSAID use and rhabdomyolysis.  Hospitalization complicated by fevers and Klebsiella UTI.  Blood cultures negative.  Patient on ceftriaxone.   On presentation 10/13/2022 WBC 46.7 predominantly neutrophilia.  More than 20% bands. WBC on 10/16/22 51.4, ANC 39.3, Monocytes 3.0. mild left shift with 1-5% metamyelocytes and occasional myelocytes. No blasts noted. Moncocytes fluctuating between normal and 3.0.   CBC with diff from 08/18/2022 was normal, WBC 10.1, Hb 13 and normal plts  Also found to have elevated D-dimer.  Venous Dopplers negative for DVT.  Negative VQ scan.  Was briefly treated with heparin drip which is discontinued now.  Hematology consulted for persistent leukocytosis despite patient being on appropriate antibiotics.  Patient was seen today at bedside with the daughter and RN. He is feeling better. More coherent. Had one session of dialysis.   REVIEW OF SYSTEMS:   ROS  As per HPI. Otherwise, a complete review of systems is negative.  PAST MEDICAL HISTORY: Past Medical History:  Diagnosis Date   Arthritis    Chest pain     PAST SURGICAL HISTORY: Past Surgical History:  Procedure Laterality Date   COLONOSCOPY WITH PROPOFOL N/A 09/12/2016   Procedure: COLONOSCOPY WITH PROPOFOL;  Surgeon: Scot Jun, MD;  Location: [facility]  ENDOSCOPY;  Service: Endoscopy;  Laterality: N/A;   COLONOSCOPY WITH PROPOFOL N/A 04/02/2020   Procedure: COLONOSCOPY WITH PROPOFOL;  Surgeon: Pasty Spillers, MD;  Location: [facility] ENDOSCOPY;  Service: Endoscopy;  Laterality: N/A;   JOINT REPLACEMENT     LT TKA 1995   ROTATOR CUFF REPAIR Left 2013   SHOULDER ARTHROSCOPY WITH ROTATOR  CUFF REPAIR Right 11/15/2017   Procedure: SHOULDER ARTHROSCOPY WITH ROTATOR CUFF REPAIR;  Surgeon: Lyndle Herrlich, MD;  Location: [facility] ORS;  Service: Orthopedics;  Laterality: Right;    FAMILY HISTORY: History reviewed. No pertinent family history.  HEALTH MAINTENANCE: Social History   Tobacco Use   Smoking status: Every Day    Types: Cigarettes, Cigars   Smokeless tobacco: Never   Tobacco comments:    5 per day  Vaping Use   Vaping status: Never Used  Substance Use Topics   Alcohol use: Yes    Comment: occ   Drug use: No     No Known Allergies  Current Facility-Administered Medications  Medication Dose Route Frequency Provider Last Rate Last Admin   acetaminophen (TYLENOL) tablet 650 mg  650 mg Oral Q6H PRN Andris Baumann, MD   650 mg at 10/16/22 2020   alteplase (CATHFLO ACTIVASE) injection 2 mg  2 mg Intracatheter Once PRN Wendee Beavers, NP       ascorbic acid (VITAMIN C) tablet 500 mg  500 mg Oral BID Dow Adolph N, DO   500 mg at 10/16/22 0932   cefTRIAXone (ROCEPHIN) 2 g in sodium chloride 0.9 % 100 mL IVPB  2 g Intravenous Q24H Dow Adolph N, DO   Stopped at 10/16/22 1611   Chlorhexidine Gluconate Cloth 2 % PADS 6 each  6 each Topical Daily Arnetha Courser, MD   6 each at 10/16/22 0949   Chlorhexidine Gluconate Cloth 2 % PADS 6 each  6 each Topical Q0600 Wendee Beavers, NP       chlorproMAZINE (THORAZINE) tablet 10 mg  10 mg Oral TID PRN Arnetha Courser, MD   10 mg at 10/16/22 1221   cyanocobalamin (VITAMIN B12) tablet 1,000 mcg  1,000 mcg Oral Daily Arnetha Courser, MD   1,000 mcg at 10/16/22 0932   feeding supplement (ENSURE ENLIVE / ENSURE PLUS) liquid 237 mL  237 mL Oral TID BM Dow Adolph N, DO   237 mL at 10/16/22 2020   folic acid (FOLVITE) tablet 1 mg  1 mg Oral Daily Dow Adolph N, DO   1 mg at 10/16/22 0932   heparin injection 1,000 Units  1,000 Units Intracatheter PRN Wendee Beavers, NP   2,800 Units at 10/15/22 2240   heparin injection 5,000 Units   5,000 Units Subcutaneous Q8H Nazari, Walid A, RPH   5,000 Units at 10/16/22 1723   LORazepam (ATIVAN) tablet 1-4 mg  1-4 mg Oral Q1H PRN Darlin Drop, DO       Or   LORazepam (ATIVAN) injection 1-4 mg  1-4 mg Intravenous Q1H PRN Dow Adolph N, DO   1 mg at 10/15/22 0326   melatonin tablet 5 mg  5 mg Oral QHS PRN Darlin Drop, DO       multivitamin with minerals tablet 1 tablet  1 tablet Oral Daily Dow Adolph N, DO   1 tablet at 10/16/22 0931   nicotine (NICODERM CQ - dosed in mg/24 hours) patch 14 mg  14 mg Transdermal Daily Arnetha Courser, MD   14 mg at 10/16/22 0942   ondansetron (ZOFRAN) tablet  4 mg  4 mg Oral Q6H PRN Arnetha Courser, MD       Or   ondansetron (ZOFRAN) injection 4 mg  4 mg Intravenous Q6H PRN Arnetha Courser, MD   4 mg at 10/14/22 0854   Oral care mouth rinse  15 mL Mouth Rinse PRN Arnetha Courser, MD       polyethylene glycol (MIRALAX / GLYCOLAX) packet 17 g  17 g Oral Daily PRN Arnetha Courser, MD       prochlorperazine (COMPAZINE) injection 5 mg  5 mg Intravenous Q6H PRN Dow Adolph N, DO       thiamine (VITAMIN B1) tablet 100 mg  100 mg Oral Daily Dow Adolph N, DO   100 mg at 10/16/22 6010   Or   thiamine (VITAMIN B1) injection 100 mg  100 mg Intravenous Daily Dow Adolph N, DO   100 mg at 10/15/22 0947   traMADol (ULTRAM) tablet 50 mg  50 mg Oral Q8H PRN Arnetha Courser, MD   50 mg at 10/16/22 1723   [START ON 10/17/2022] vancomycin variable dose per unstable renal function (pharmacist dosing)   Does not apply See admin instructions Barrie Folk, RPH       Vitamin D (Ergocalciferol) (DRISDOL) 1.25 MG (50000 UNIT) capsule 50,000 Units  50,000 Units Oral Q7 days Darlin Drop, DO   50,000 Units at 10/14/22 1638    OBJECTIVE: Vitals:   10/16/22 1900 10/16/22 2000  BP: (!) 126/54 (!) 103/51  Pulse: (!) 103 93  Resp: 18 18  Temp:  100.3 F (37.9 C)  SpO2: 96% 94%     Body mass index is 28.1 kg/m.      General: Well-developed, well-nourished, no acute  distress. Eyes: Pink conjunctiva, anicteric sclera. HEENT: Normocephalic, moist mucous membranes, clear oropharnyx. Lungs: Clear to auscultation bilaterally. Heart: Regular rate and rhythm. No rubs, murmurs, or gallops. Abdomen: Soft, nontender, nondistended. No organomegaly noted, normoactive bowel sounds. Musculoskeletal: No edema, cyanosis, or clubbing. Neuro: Alert, answering all questions appropriately. Cranial nerves grossly intact. Skin: No rashes or petechiae noted. Psych: Normal affect. Lymphatics: No cervical, calvicular, axillary or inguinal LAD.   LAB RESULTS:  Lab Results  Component Value Date   NA 138 10/16/2022   K 3.8 10/16/2022   CL 101 10/16/2022   CO2 23 10/16/2022   GLUCOSE 121 (H) 10/16/2022   BUN 67 (H) 10/16/2022   CREATININE 4.32 (H) 10/16/2022   CALCIUM 8.2 (L) 10/16/2022   PROT 6.8 10/14/2022   ALBUMIN 2.9 (L) 10/14/2022   AST 46 (H) 10/14/2022   ALT 27 10/14/2022   ALKPHOS 35 (L) 10/14/2022   BILITOT 0.5 10/14/2022   GFRNONAA 14 (L) 10/16/2022    Lab Results  Component Value Date   WBC 51.4 (HH) 10/16/2022   NEUTROABS 37.8 (H) 10/15/2022   HGB 11.0 (L) 10/16/2022   HCT 30.7 (L) 10/16/2022   MCV 88.0 10/16/2022   PLT 186 10/16/2022    Lab Results  Component Value Date   FERRITIN 315 10/16/2022     STUDIES: NM Pulmonary Perfusion  Result Date: 10/15/2022 CLINICAL DATA:  Short of breath.  Concern for pulmonary embolism. EXAM: NUCLEAR MEDICINE PERFUSION LUNG SCAN TECHNIQUE: Perfusion images were obtained in multiple projections after intravenous injection of radiopharmaceutical. Patient imaged with LEFT arm down RADIOPHARMACEUTICALS:  4.7 mCi Tc-60m MAA COMPARISON:  Radiograph 10/15/2022 FINDINGS: No wedge-shaped peripheral perfusion defect within LEFT or RIGHT lung to suggest acute pulmonary embolism. Decreased regional perfusion of the lung apices suggest  emphysema. IMPRESSION: No evidence acute pulmonary embolism. Electronically Signed    By: Genevive Bi M.D.   On: 10/15/2022 13:59   DG Chest Port 1 View  Result Date: 10/15/2022 CLINICAL DATA:  Central line placement EXAM: PORTABLE CHEST 1 VIEW COMPARISON:  10/14/2022 FINDINGS: Right internal jugular dialysis catheter in place with the tip in the right atrium. No pneumothorax. Vascular congestion. Interstitial prominence could reflect interstitial edema. Heart mediastinal contours within normal limits. No visible effusions. IMPRESSION: Right hilus catheter placement with the tip in the right atrium. No pneumothorax. Vascular congestion. Diffuse interstitial prominence could reflect interstitial edema. Electronically Signed   By: Charlett Nose M.D.   On: 10/15/2022 01:29   US Venous Img Upper Uni Left (DVT)  Result Date: 10/14/2022 CLINICAL DATA:  Left arm swelling EXAM: LEFT UPPER EXTREMITY VENOUS DOPPLER ULTRASOUND TECHNIQUE: Gray-scale sonography with graded compression, as well as color Doppler and duplex ultrasound were performed to evaluate the upper extremity deep venous system from the level of the subclavian vein and including the jugular, axillary, basilic, radial, ulnar and upper cephalic vein. Spectral Doppler was utilized to evaluate flow at rest and with distal augmentation maneuvers. COMPARISON:  None Available. FINDINGS: Contralateral Subclavian Vein: Respiratory phasicity is normal and symmetric with the symptomatic side. No evidence of thrombus. Normal compressibility. Internal Jugular Vein: No evidence of thrombus. Normal compressibility, respiratory phasicity and response to augmentation. Subclavian Vein: No evidence of thrombus. Normal compressibility, respiratory phasicity and response to augmentation. Axillary Vein: No evidence of thrombus. Normal compressibility, respiratory phasicity and response to augmentation. Cephalic Vein: No evidence of thrombus. Normal compressibility, respiratory phasicity and response to augmentation. Basilic Vein: No evidence of thrombus.  Normal compressibility, respiratory phasicity and response to augmentation. Brachial Veins: No evidence of thrombus. Normal compressibility, respiratory phasicity and response to augmentation. Radial Veins: No evidence of thrombus. Normal compressibility, respiratory phasicity and response to augmentation. Ulnar Veins: No evidence of thrombus. Normal compressibility, respiratory phasicity and response to augmentation. Other Findings:  None visualized. IMPRESSION: 1. No evidence of deep venous thrombosis within the left upper extremity. Electronically Signed   By: Sharlet Salina M.D.   On: 10/14/2022 16:56   DG Chest Port 1 View  Result Date: 10/14/2022 CLINICAL DATA:  Hypoxic respiratory failure EXAM: PORTABLE CHEST 1 VIEW COMPARISON:  Chest radiograph 1 day prior FINDINGS: The cardiomediastinal silhouette is stable. A calcified right lower paratracheal lymph node is unchanged. Probable mild pulmonary interstitial edema is unchanged. There is no new or worsening focal airspace disease. There is no pleural effusion or pneumothorax There is no acute osseous abnormality. IMPRESSION: Unchanged mild pulmonary edema. Electronically Signed   By: Lesia Hausen M.D.   On: 10/14/2022 16:02   ECHOCARDIOGRAM COMPLETE  Result Date: 10/14/2022    ECHOCARDIOGRAM REPORT   Patient Name:   Joshua Mora Date of Exam: 10/14/2022 Medical Rec #:  [id number]            Height:       69.0 in Accession #:    [id number]           Weight:       185.6 lb Date of Birth:  12-13-1950            BSA:          2.002 m Patient Age:    72 years             BP:           120/61 mmHg Patient  Gender: M                    HR:           107 bpm. Exam Location:  [facility] Procedure: 2D Echo, Cardiac Doppler, Color Doppler and Intracardiac            Opacification Agent Indications:     Elevated Troponin  History:         Patient has no prior history of Echocardiogram examinations.                  Signs/Symptoms:Chest Pain; Risk Factors:Diabetes,  Dyslipidemia                  and Current Smoker.  Sonographer:     Mikki Harbor Referring Phys:  [id number] Oliver Pila HALL Diagnosing Phys: Julien Nordmann MD  Sonographer Comments: Technically difficult study due to poor echo windows. Image acquisition challenging due to respiratory motion. IMPRESSIONS  1. Left ventricular ejection fraction, by estimation, is 60 to 65%. The left ventricle has normal function. The left ventricle has no regional wall motion abnormalities. There is mild left ventricular hypertrophy. Left ventricular diastolic parameters are consistent with Grade I diastolic dysfunction (impaired relaxation).  2. Right ventricular systolic function is normal. The right ventricular size is normal. Tricuspid regurgitation signal is inadequate for assessing PA pressure.  3. The mitral valve is normal in structure. No evidence of mitral valve regurgitation. No evidence of mitral stenosis.  4. The aortic valve has an indeterminant number of cusps. Aortic valve regurgitation is mild. Aortic valve sclerosis is present, with no evidence of aortic valve stenosis.  5. The inferior vena cava is normal in size with greater than 50% respiratory variability, suggesting right atrial pressure of 3 mmHg. FINDINGS  Left Ventricle: Left ventricular ejection fraction, by estimation, is 60 to 65%. The left ventricle has normal function. The left ventricle has no regional wall motion abnormalities. Definity contrast agent was given IV to delineate the left ventricular  endocardial borders. The left ventricular internal cavity size was normal in size. There is mild left ventricular hypertrophy. Left ventricular diastolic parameters are consistent with Grade I diastolic dysfunction (impaired relaxation). Right Ventricle: The right ventricular size is normal. No increase in right ventricular wall thickness. Right ventricular systolic function is normal. Tricuspid regurgitation signal is inadequate for assessing PA pressure.  Left Atrium: Left atrial size was normal in size. Right Atrium: Right atrial size was normal in size. Pericardium: There is no evidence of pericardial effusion. Mitral Valve: The mitral valve is normal in structure. No evidence of mitral valve regurgitation. No evidence of mitral valve stenosis. MV peak gradient, 4.5 mmHg. The mean mitral valve gradient is 2.0 mmHg. Tricuspid Valve: The tricuspid valve is normal in structure. Tricuspid valve regurgitation is mild . No evidence of tricuspid stenosis. Aortic Valve: The aortic valve has an indeterminant number of cusps. Aortic valve regurgitation is mild. Aortic valve sclerosis is present, with no evidence of aortic valve stenosis. Aortic valve mean gradient measures 5.0 mmHg. Aortic valve peak gradient measures 11.2 mmHg. Aortic valve area, by VTI measures 3.30 cm. Pulmonic Valve: The pulmonic valve was normal in structure. Pulmonic valve regurgitation is not visualized. No evidence of pulmonic stenosis. Aorta: The aortic root is normal in size and structure. Venous: The inferior vena cava is normal in size with greater than 50% respiratory variability, suggesting right atrial pressure of 3 mmHg. IAS/Shunts: No atrial level shunt detected by color  flow Doppler.  LEFT VENTRICLE PLAX 2D LVIDd:         4.40 cm   Diastology LVIDs:         2.90 cm   LV e' medial:    7.83 cm/s LV PW:         1.20 cm   LV E/e' medial:  10.2 LV IVS:        1.20 cm   LV e' lateral:   8.38 cm/s LVOT diam:     2.20 cm   LV E/e' lateral: 9.6 LV SV:         77 LV SV Index:   39 LVOT Area:     3.80 cm  RIGHT VENTRICLE RV Basal diam:  2.95 cm RV Mid diam:    2.90 cm RV S prime:     18.80 cm/s LEFT ATRIUM           Index        RIGHT ATRIUM           Index LA diam:      2.90 cm 1.45 cm/m   RA Area:     12.20 cm LA Vol (A4C): 46.8 ml 23.38 ml/m  RA Volume:   27.00 ml  13.49 ml/m  AORTIC VALVE                    PULMONIC VALVE AV Area (Vmax):    2.94 cm     PV Vmax:       1.00 m/s AV Area  (Vmean):   3.49 cm     PV Peak grad:  4.0 mmHg AV Area (VTI):     3.30 cm AV Vmax:           167.00 cm/s AV Vmean:          97.100 cm/s AV VTI:            0.234 m AV Peak Grad:      11.2 mmHg AV Mean Grad:      5.0 mmHg LVOT Vmax:         129.00 cm/s LVOT Vmean:        89.100 cm/s LVOT VTI:          0.203 m LVOT/AV VTI ratio: 0.87  AORTA Ao Root diam: 3.20 cm MITRAL VALVE MV Area (PHT): 3.46 cm     SHUNTS MV Area VTI:   4.06 cm     Systemic VTI:  0.20 m MV Peak grad:  4.5 mmHg     Systemic Diam: 2.20 cm MV Mean grad:  2.0 mmHg MV Vmax:       1.06 m/s MV Vmean:      61.0 cm/s MV Decel Time: 219 msec MV E velocity: 80.20 cm/s MV A velocity: 114.00 cm/s MV E/A ratio:  0.70 Julien Nordmann MD Electronically signed by Julien Nordmann MD Signature Date/Time: 10/14/2022/1:31:20 PM    Final    US Venous Img Lower Bilateral (DVT)  Result Date: 10/14/2022 CLINICAL DATA:  10026 Shortness of breath 10026 EXAM: BILATERAL LOWER EXTREMITY VENOUS DOPPLER ULTRASOUND TECHNIQUE: Gray-scale sonography with graded compression, as well as color Doppler and duplex ultrasound were performed to evaluate the lower extremity deep venous systems from the level of the common femoral vein and including the common femoral, femoral, profunda femoral, popliteal and calf veins including the posterior tibial, peroneal and gastrocnemius veins when visible. The superficial great saphenous vein was also interrogated. Spectral Doppler was utilized to evaluate flow  at rest and with distal augmentation maneuvers in the common femoral, femoral and popliteal veins. COMPARISON:  Lower extremity XRs, 10/13/2022. FINDINGS: RIGHT LOWER EXTREMITY VENOUS Normal compressibility of the RIGHT common femoral, superficial femoral, and popliteal veins, as well as the visualized calf veins. Visualized portions of profunda femoral vein and great saphenous vein unremarkable. No filling defects to suggest DVT on grayscale or color Doppler imaging. Doppler waveforms  show normal direction of venous flow, normal respiratory plasticity and response to augmentation. OTHER No evidence of superficial thrombophlebitis or abnormal fluid collection. Limitations: none LEFT LOWER EXTREMITY VENOUS Normal compressibility of the LEFT common femoral, superficial femoral, and popliteal veins, as well as the visualized calf veins. Visualized portions of profunda femoral vein and great saphenous vein unremarkable. No filling defects to suggest DVT on grayscale or color Doppler imaging. Doppler waveforms show normal direction of venous flow, normal respiratory plasticity and response to augmentation. OTHER No evidence of superficial thrombophlebitis or abnormal fluid collection. Limitations: none IMPRESSION: No evidence of femoropopliteal DVT or superficial thrombophlebitis within either lower extremity. Roanna Banning, MD [facility] Electronically Signed   By: Roanna Banning M.D.   On: 10/14/2022 06:21   DG Chest Port 1 View  Result Date: 10/13/2022 CLINICAL DATA:  16109 Acute respiratory distress 66502 EXAM: PORTABLE CHEST 1 VIEW.  Patient is rotated. COMPARISON:  Chest x-ray 10/13/2022, chest x-ray report 05/15/2017 FINDINGS: Slightly more prominent thoracic aorta silhouette likely due to portable AP technique and slight patient rotation. The heart and mediastinal contours are otherwise unchanged. Aortic calcification. Chronic calcified right lower paratracheal region lymph nodes suggestive of prior granulomatous disease. Limited evaluation of the lung apices due to overlying mandible. Increased interstitial markings. No focal consolidation. No pulmonary edema. No pleural effusion. No pneumothorax. No acute osseous abnormality. IMPRESSION: 1. Mild pulmonary edema. 2.  Aortic Atherosclerosis (ICD10-I70.0). Electronically Signed   By: Tish Frederickson M.D.   On: 10/13/2022 21:30   DG Ankle Complete Left  Result Date:  10/13/2022 CLINICAL DATA:  Left ankle pain after multiple falls. EXAM: LEFT ANKLE COMPLETE - 3+ VIEW COMPARISON:  None Available. FINDINGS: There is no evidence of acute fracture, dislocation, or joint effusion. There is no evidence of arthropathy. Rounded densities are seen inferior to medial malleolus suggesting degenerative change or old injury. Soft tissues are unremarkable. IMPRESSION: No acute abnormality seen. Electronically Signed   By: Lupita Raider M.D.   On: 10/13/2022 18:20   CT HEAD WO CONTRAST ( )  Result Date: 10/13/2022 CLINICAL DATA:  Generalized weakness and multiple falls. EXAM: CT HEAD WITHOUT CONTRAST TECHNIQUE: Contiguous axial images were obtained from the base of the skull through the vertex without intravenous contrast. RADIATION DOSE REDUCTION: This exam was performed according to the departmental dose-optimization program which includes automated exposure control, adjustment of the mA and/or kV according to patient size and/or use of iterative reconstruction technique. COMPARISON:  August 18, 2022 FINDINGS: Brain: There is mild to moderate severity cerebral atrophy with widening of the extra-axial spaces and ventricular dilatation. There are areas of decreased attenuation within the white matter tracts of the supratentorial brain, consistent with microvascular disease changes. 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 Vascular: No hyperdense vessel or unexpected calcification. Skull: A chronic left-sided nasal bone fracture is seen. Sinuses/Orbits: No acute finding. Other: None. IMPRESSION: 1. Generalized cerebral atrophy with chronic white matter small vessel ischemic changes. 2. 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 3. No acute intracranial abnormality. Electronically Signed   By: Aram Candela M.D.   On: 10/13/2022 17:05   US RENAL  Result Date: 10/13/2022 CLINICAL DATA:  Acute  renal failure EXAM: RENAL / URINARY TRACT ULTRASOUND COMPLETE COMPARISON:  None Available. FINDINGS: Right Kidney: Renal measurements: 10.0 x 5.1 x 5.3 cm = volume: 139.9 mL. No collecting system dilatation or perinephric fluid. Along the upper pole of the kidney is a echogenic area along the cortex measuring 15 x 16 x 15 mm this is of uncertain etiology. An underlying lesion is possible Left Kidney: Renal measurements: 10.2 by 6.4 x 4.9 cm = volume: 156.6 mL. Echogenicity within normal limits. No mass or hydronephrosis visualized. Bladder: Bladder is underdistended. Other: None. IMPRESSION: No collecting system dilatation. Ill-defined echogenic area in the upper pole of the right kidney measuring 15 mm. Underlying lesion is not excluded. Dedicated cross-sectional imaging study can be performed when clinically appropriate to confirm presence or absence of the lesion Electronically Signed   By: Karen Kays M.D.   On: 10/13/2022 16:51   DG Chest 2 View  Result Date: 10/13/2022 CLINICAL DATA:  Chest pain, weakness EXAM: CHEST - 2 VIEW COMPARISON:  None Available. FINDINGS: Mild left basilar atelectasis. Right lung is clear. No pleural effusion or pneumothorax. The heart is normal in size.  Thoracic aortic atherosclerosis. Degenerative changes of the visualized thoracolumbar spine. IMPRESSION: No acute cardiopulmonary disease. Electronically Signed   By: Charline Bills M.D.   On: 10/13/2022 15:40   DG Knee 1-2 Views Right  Result Date: 10/10/2022 CLINICAL DATA:  Bilateral knee pain. EXAM: RIGHT KNEE - 1-2 VIEW COMPARISON:  Tibia/fibular radiographs 08/18/2022 FINDINGS: Medial tibiofemoral joint space narrowing. There is mild tricompartmental peripheral spurring. No evidence of fracture. No erosion or focal bone abnormality. No significant knee joint effusion. Minimal quadriceps tendon enthesophyte. There are vascular calcifications. 5 mm density in the subcutaneous tissues anteriorly over the upper tibia is  unchanged from prior tibia/fibular radiographs and may represent foreign body versus soft tissue calcifications. IMPRESSION: 1. 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 2. Possible foreign body versus soft tissue calcification in the anterior soft tissues, unchanged from July exam. Electronically Signed   By: Narda Rutherford M.D.   On: 10/10/2022 10:33   DG Knee Complete 4 Views Left  Result Date: 10/10/2022 CLINICAL DATA:  Bilateral knee pain. History of left knee replacement. EXAM: LEFT KNEE - COMPLETE 4+ VIEW COMPARISON:  None Available. FINDINGS: Left knee arthroplasty in expected alignment. There is no periprosthetic lucency. There is a nondisplaced fracture of the proximal fibular neck that may be acute. No periprosthetic fracture. Prior patellar resurfacing. Geographic sclerotic density in the distal femur has the appearance of a bone infarct. Minimal knee joint effusion. Quadriceps tendon enthesophyte. There are vascular calcifications. IMPRESSION: 1. Nondisplaced fracture of the proximal fibular neck that may be acute. 2. Left knee arthroplasty in expected alignment. No hardware complication. Electronically Signed   By: Narda Rutherford M.D.   On: 10/10/2022 10:31      Michaelyn Barter, MD   10/16/2022 9:08 PM

## 2022-10-16 NOTE — Assessment & Plan Note (Signed)
Patient did develop acute hypoxic respiratory failure and repeat chest x-ray at that time with concern of pulmonary congestion.  Echocardiogram with normal EF and grade 1 diastolic dysfunction.  VQ scan negative for PE Patient with significant AKI, also concern of NSAID induced renal injury. Started on dialysis. -Continue IV Lasix -Continue supplemental oxygen-wean as tolerated

## 2022-10-16 NOTE — Hospital Course (Addendum)
Taken from prior notes.  Joshua Mora is a 72 y.o. male with medical history significant of alcohol abuse, tobacco use disorder, diet controlled prediabetes, CKD 3B, osteoarthritis s/p left knee replacement who presented to the ED with complaints of progressive weakness and frequent falls over the past 1 month.     On presentation he was found to have severely elevated creatinine above baseline for which nephrology was consulted. Suspect AKI secondary to NSAID induced nephropathy and concurrent sepsis. No history of diabetes or hypertension per patient. Renal ultrasound with no obstruction. No IV contrast.   -Underwent first hemodialysis treatment on 10/15/2022, first series lasted until 10/21/22.  Then resumed hemodialysis on 9/10 and 9/12.  --PermCath placed on 9/13.  Later renal function stabilizes and dialysis was held.  Permanent catheter was removed on 11/03/2022 and nephrology will follow-up closely as outpatient.  Patient also had hyperkalemia over the course of hospitalization which was treated medically and with dialysis.  Potassium currently normal.  Renal function stable with creatinine at 2.60.  Also with significant leukocytosis above 46,000 with concern for leukemoid reaction in the setting of presumptive UTI.  Trauma imaging were nonacute.  CT head also nonacute.  Hospital course was complicated by acute hypoxic respiratory failure.  Was found to have mild pulmonary edema on chest x-ray.  Elevated D-dimer above 11>>13. with concern for possible pulmonary embolism.  Heparin drip initiated.  Which was later discontinued when VQ scan and lower extremity venous Doppler was negative.  Oncology was also consulted for persistent and significant leukocytosis.Slowly improving, pathologist smear review with immature granulocytes, initial smear review with bandemia. Urine cultures with Klebsiella pneumonia but patient is being treated with appropriate antibiotics. likely leukemoid reaction with  severe inflammation and infection but due to persistent elevation patient underwent bone marrow biopsy on 9/10-showed reactive changes. Cytogenetics and myeloid NGS is pending.   Patient met sepsis criteria with tachycardia, severe leukocytosis and severe sepsis with lactic acidosis above 4.3. Initial procalcitonin was 60.67 which is decreasing.  Patient developed on and off low-grade fever.  All other cultures remain negative which include left knee aspirate.  ID was also consulted and patient completed a course of antibiotic, ID is now advising monitoring without antibiotics for now.  Patient was also found to have severe vitamin D deficiency and started on supplement.  Due to persistent left ankle pain and negative x-ray for any acute fracture, patient has MRI of left ankle with complete tear of talofibular ligament and multiple other ligamental injuries. -Podiatry was consulted-recommending conservative management with pain control and CAM boot for 4 to 6 weeks and outpatient follow-up --cont Norco PRN.  Due to persistent weakness and history of frequent falls, patient underwent PT evaluation and management and they were recommending going to rehab to regain his strength.  Patient is being discharged to rehab for further management.  Patient need to follow-up with podiatry, nephrology and oncology as outpatient for further recommendations.  Patient will continue on current management and follow-up with his providers closely for further recommendations.

## 2022-10-16 NOTE — Progress Notes (Signed)
[facility] [place], [place] 10/16/22  Subjective:   Hospital day # 3  Remains critically ill.  Sister at bedside. Received his first dialysis treatment yesterday.  Per report his blood pressure dropped 1 time to 106/54 but then recovered within 15 minutes.  Postdialysis blood pressure was mildly low at 109/54. Total of 300 cc was removed. Urine output of 515 cc from last 24 hours.  Renal: 08/31 0701 - 09/01 0700 In: 2089.5 [P.O.:1500; I.V.:489.5; IV Piggyback:100] Out: 815 [Urine:515] Lab Results  Component Value Date   CREATININE 4.32 (H) 10/16/2022   CREATININE 6.18 (H) 10/15/2022   CREATININE 6.43 (H) 10/14/2022     Objective:  Vital signs in last 24 hours:  Temp:  [97.4 F (36.3 C)-100.7 F (38.2 C)] 97.5 F (36.4 C) (09/01 1141) Pulse Rate:  [67-111] 89 (09/01 1141) Resp:  [16-28] 19 (09/01 1141) BP: (85-132)/(47-79) 103/60 (09/01 1100) SpO2:  [85 %-97 %] 96 % (09/01 1141) Weight:  [86.3 kg] 86.3 kg (09/01 0300)  Weight change: -0.9 kg Filed Weights   10/14/22 0500 10/15/22 0400 10/16/22 0300  Weight: 84.2 kg 87.2 kg 86.3 kg    Intake/Output:    Intake/Output Summary (Last 24 hours) at 10/16/2022 1212 Last data filed at 10/16/2022 1101 Gross per 24 hour  Intake 1507.6 ml  Output 590 ml  Net 917.6 ml     Physical Exam: General: Critically ill-appearing  HEENT Moist oral mucous membranes  Pulm/lungs [place] O2  CVS/Heart Irregular  Abdomen:  Soft, mildly distended, nontender  Extremities: Dependent edema present  Neurologic: Alert, responds to few simple commands  Skin: Warm  Access: Right IJ temp cath       Basic Metabolic Panel:  Recent Labs  Lab 10/13/22 1505 10/13/22 1844 10/13/22 2102 10/14/22 0234 10/15/22 0129 10/16/22 0347  NA 138  --  144 144 140 138  K 4.2  --  4.6 3.4* 3.9 3.8  CL 116*  --  111 106 104 101  CO2 10*  --  15* 18* 24 23  GLUCOSE 102*  --  106* 116* 131* 121*  BUN 72*  --  75* 77* 80* 67*   CREATININE 6.30* 6.31* 6.55* 6.43* 6.18* 4.32*  CALCIUM 8.9  --  8.7* 9.0 8.1* 8.2*  MG  --   --   --   --  1.8 1.8  PHOS  --   --   --   --  4.2 3.6     CBC: Recent Labs  Lab 10/13/22 1844 10/13/22 2102 10/14/22 0234 10/15/22 0127 10/16/22 0347  WBC 45.3* 27.8* 49.9* 44.9* 51.4*  NEUTROABS 39.3* 25.6*  --  37.8*  --   HGB 13.2 13.1 11.9* 11.0* 11.0*  HCT 40.1 40.1 34.3* 31.9* 30.7*  MCV 91.8 93.3 88.6 90.1 88.0  PLT 371 310 308 234 186      Lab Results  Component Value Date   HEPBSAG NON REACTIVE 10/15/2022   HEPBIGM NON REACTIVE 10/15/2022      Microbiology:  Recent Results (from the past 240 hour(s))  SARS Coronavirus 2 by RT PCR (hospital order, performed in [facility] hospital lab) *cepheid single result test* Anterior Nasal Swab     Status: None   Collection Time: 10/13/22  3:55 PM   Specimen: Anterior Nasal Swab  Result Value Ref Range Status   SARS Coronavirus 2 by RT PCR NEGATIVE NEGATIVE Final    Comment: (NOTE) SARS-CoV-2 target nucleic acids are NOT DETECTED.  The SARS-CoV-2 RNA is generally  detectable in upper and lower respiratory specimens during the acute phase of infection. The lowest concentration of SARS-CoV-2 viral copies this assay can detect is 250 copies / mL. A negative result does not preclude SARS-CoV-2 infection and should not be used as the sole basis for treatment or other patient management decisions.  A negative result may occur with improper specimen collection / handling, submission of specimen other than nasopharyngeal swab, presence of viral mutation(s) within the areas targeted by this assay, and inadequate number of viral copies (<250 copies / mL). A negative result must be combined with clinical observations, patient history, and epidemiological information.  Fact Sheet for Patients:   RoadLapTop.co.za  Fact Sheet for Healthcare Providers: http://kim-miller.com/  This test  is not yet approved or  cleared by the [place] FDA and has been authorized for detection and/or diagnosis of SARS-CoV-2 by FDA under an Emergency Use Authorization (EUA).  This EUA will remain in effect (meaning this test can be used) for the duration of the COVID-19 declaration under Section 564(b)(1) of the Act, 21 U.S.C. section 360bbb-3(b)(1), unless the authorization is terminated or revoked sooner.  Performed at [facility], [address] [place], [place] [address]   MRSA Next Gen by PCR, Nasal     Status: None   Collection Time: 10/13/22  9:51 PM   Specimen: Nasal Mucosa; Nasal Swab  Result Value Ref Range Status   MRSA by PCR Next Gen NOT DETECTED NOT DETECTED Final    Comment: (NOTE) The GeneXpert MRSA Assay (FDA approved for NASAL specimens only), is one component of a comprehensive MRSA colonization surveillance program. It is not intended to diagnose MRSA infection nor to guide or monitor treatment for MRSA infections. Test performance is not FDA approved in patients less than 25 years old. Performed at [facility], [address], [place], [place] [address]   Urine Culture     Status: Abnormal   Collection Time: 10/14/22  7:32 AM   Specimen: Urine, Random  Result Value Ref Range Status   Specimen Description   Final    URINE, RANDOM Performed at [facility], [address] [place], [place] [address]    Special Requests   Final    NONE Reflexed from [id number] Performed at [facility], [address] [place], [place] [address]    Culture >=100,000 COLONIES/mL KLEBSIELLA PNEUMONIAE (A)  Final   Report Status 10/16/2022 FINAL  Final   Organism ID, Bacteria KLEBSIELLA PNEUMONIAE (A)  Final      Susceptibility   Klebsiella pneumoniae - MIC*    AMPICILLIN >=32 RESISTANT Resistant     CEFAZOLIN <=4 SENSITIVE Sensitive     CEFEPIME <=0.12 SENSITIVE Sensitive     CEFTRIAXONE <=0.25 SENSITIVE Sensitive      CIPROFLOXACIN <=0.25 SENSITIVE Sensitive     GENTAMICIN <=1 SENSITIVE Sensitive     IMIPENEM <=0.25 SENSITIVE Sensitive     NITROFURANTOIN 128 RESISTANT Resistant     TRIMETH/SULFA <=20 SENSITIVE Sensitive     AMPICILLIN/SULBACTAM 8 SENSITIVE Sensitive     PIP/TAZO <=4 SENSITIVE Sensitive     * >=100,000 COLONIES/mL KLEBSIELLA PNEUMONIAE  Culture, blood (Routine X 2) w Reflex to ID Panel     Status: None (Preliminary result)   Collection Time: 10/15/22  1:29 AM   Specimen: Right Antecubital; Blood  Result Value Ref Range Status   Specimen Description RIGHT ANTECUBITAL  Final   Special Requests   Final    BOTTLES DRAWN AEROBIC AND  ANAEROBIC Blood Culture adequate volume   Culture   Final    NO GROWTH 1 DAY Performed at [facility], [address] [place], [place] [address]    Report Status PENDING  Incomplete  Culture, blood (Routine X 2) w Reflex to ID Panel     Status: None (Preliminary result)   Collection Time: 10/15/22 11:41 AM   Specimen: BLOOD  Result Value Ref Range Status   Specimen Description BLOOD BLOOD RIGHT HAND  Final   Special Requests   Final    BOTTLES DRAWN AEROBIC ONLY Blood Culture results may not be optimal due to an inadequate volume of blood received in culture bottles   Culture   Final    NO GROWTH < 24 HOURS Performed at [facility], [address] [place], [place] [address]    Report Status PENDING  Incomplete    Coagulation Studies: Recent Labs    10/13/22 2102 10/14/22 1601  LABPROT 16.7* 18.1*  INR 1.3* 1.5*    Urinalysis: Recent Labs    10/14/22 0732  COLORURINE YELLOW*  LABSPEC 1.011  PHURINE 6.0  GLUCOSEU NEGATIVE  HGBUR LARGE*  BILIRUBINUR NEGATIVE  KETONESUR NEGATIVE  PROTEINUR 100*  NITRITE POSITIVE*  LEUKOCYTESUR LARGE*      Imaging: NM Pulmonary Perfusion  Result Date: 10/15/2022 CLINICAL DATA:  Short of breath.  Concern for pulmonary embolism. EXAM: NUCLEAR MEDICINE PERFUSION LUNG SCAN  TECHNIQUE: Perfusion images were obtained in multiple projections after intravenous injection of radiopharmaceutical. Patient imaged with LEFT arm down RADIOPHARMACEUTICALS:  4.7 mCi Tc-50m MAA COMPARISON:  Radiograph 10/15/2022 FINDINGS: No wedge-shaped peripheral perfusion defect within LEFT or RIGHT lung to suggest acute pulmonary embolism. Decreased regional perfusion of the lung apices suggest emphysema. IMPRESSION: No evidence acute pulmonary embolism. Electronically Signed   By: Genevive Bi M.D.   On: 10/15/2022 13:59   DG Chest Port 1 View  Result Date: 10/15/2022 CLINICAL DATA:  Central line placement EXAM: PORTABLE CHEST 1 VIEW COMPARISON:  10/14/2022 FINDINGS: Right internal jugular dialysis catheter in place with the tip in the right atrium. No pneumothorax. Vascular congestion. Interstitial prominence could reflect interstitial edema. Heart mediastinal contours within normal limits. No visible effusions. IMPRESSION: Right hilus catheter placement with the tip in the right atrium. No pneumothorax. Vascular congestion. Diffuse interstitial prominence could reflect interstitial edema. Electronically Signed   By: Charlett Nose M.D.   On: 10/15/2022 01:29   US Venous Img Upper Uni Left (DVT)  Result Date: 10/14/2022 CLINICAL DATA:  Left arm swelling EXAM: LEFT UPPER EXTREMITY VENOUS DOPPLER ULTRASOUND TECHNIQUE: Gray-scale sonography with graded compression, as well as color Doppler and duplex ultrasound were performed to evaluate the upper extremity deep venous system from the level of the subclavian vein and including the jugular, axillary, basilic, radial, ulnar and upper cephalic vein. Spectral Doppler was utilized to evaluate flow at rest and with distal augmentation maneuvers. COMPARISON:  None Available. FINDINGS: Contralateral Subclavian Vein: Respiratory phasicity is normal and symmetric with the symptomatic side. No evidence of thrombus. Normal compressibility. Internal Jugular Vein: No  evidence of thrombus. Normal compressibility, respiratory phasicity and response to augmentation. Subclavian Vein: No evidence of thrombus. Normal compressibility, respiratory phasicity and response to augmentation. Axillary Vein: No evidence of thrombus. Normal compressibility, respiratory phasicity and response to augmentation. Cephalic Vein: No evidence of thrombus. Normal compressibility, respiratory phasicity and response to augmentation. Basilic Vein: No evidence of thrombus. Normal compressibility, respiratory phasicity and response to augmentation. Brachial Veins: No evidence of thrombus. Normal  compressibility, respiratory phasicity and response to augmentation. Radial Veins: No evidence of thrombus. Normal compressibility, respiratory phasicity and response to augmentation. Ulnar Veins: No evidence of thrombus. Normal compressibility, respiratory phasicity and response to augmentation. Other Findings:  None visualized. IMPRESSION: 1. No evidence of deep venous thrombosis within the left upper extremity. Electronically Signed   By: Sharlet Salina M.D.   On: 10/14/2022 16:56   DG Chest Port 1 View  Result Date: 10/14/2022 CLINICAL DATA:  Hypoxic respiratory failure EXAM: PORTABLE CHEST 1 VIEW COMPARISON:  Chest radiograph 1 day prior FINDINGS: The cardiomediastinal silhouette is stable. A calcified right lower paratracheal lymph node is unchanged. Probable mild pulmonary interstitial edema is unchanged. There is no new or worsening focal airspace disease. There is no pleural effusion or pneumothorax There is no acute osseous abnormality. IMPRESSION: Unchanged mild pulmonary edema. Electronically Signed   By: Lesia Hausen M.D.   On: 10/14/2022 16:02     Medications:    cefTRIAXone (ROCEPHIN)  IV Stopped (10/15/22 1432)   heparin 1,950 Units/hr (10/16/22 1142)    vitamin C  500 mg Oral BID   Chlorhexidine Gluconate Cloth  6 each Topical Daily   Chlorhexidine Gluconate Cloth  6 each Topical Q0600    cyanocobalamin  1,000 mcg Oral Daily   feeding supplement  237 mL Oral TID BM   folic acid  1 mg Oral Daily   multivitamin with minerals  1 tablet Oral Daily   nicotine  14 mg Transdermal Daily   thiamine  100 mg Oral Daily   Or   thiamine  100 mg Intravenous Daily   Vitamin D (Ergocalciferol)  50,000 Units Oral Q7 days   acetaminophen, alteplase, chlorproMAZINE, heparin, LORazepam **OR** LORazepam, melatonin, ondansetron **OR** ondansetron (ZOFRAN) IV, mouth rinse, polyethylene glycol, prochlorperazine, traMADol  Assessment/ Plan:  72 y.o. male with  tobacco use, alcohol abuse, osteoarthritis status post left knee replacement and hyperlipidemia, frequent falls over the past few weeks since July 4 th admitted on 10/13/2022 for AKI (acute kidney injury) (HCC) [N17.9]   Acute Kidney Injury on chronic kidney disease stage IIIB   Baseline creatinine of 1.74, GFR of 41 on 08/18/22 Suspect patient's acute kidney injury secondary to NSAID induced nephropathy and concurrent sepsis. No history of diabetes or hypertension per patient.Renal ultrasound with no obstruction. No IV contrast. -Underwent first hemodialysis treatment on 10/15/2022.  Appears to be more alert today. -Electrolytes and volume status are acceptable.  Urine output has improved some.  Will watch for now.  No acute indication for dialysis at present. -Reevaluate for ongoing need of hemodialysis on a daily basis.  Sepsis Urinary source-urine culture from 10/14/2022 is growing Klebsiella. Currently being treated with iv Rocephin.   LOS: 3 Korena Nass 9/1/202412:12 PM  [facility] [place], [place] [phone]  Note: This note was prepared with Dragon dictation. Any transcription errors are unintentional

## 2022-10-16 NOTE — Assessment & Plan Note (Signed)
Patient met sepsis criteria with tachycardia, severe leukocytosis and severe sepsis with lactic acidosis above 4.3.  Initial procalcitonin was 60.67 which is decreasing. Did develop a low-grade fever overnight again.  Worsening leukocytosis.  Procalcitonin improving.  Preliminary blood cultures negative, urine cultures with Klebsiella pneumonia which shows only resistance to ampicillin and Augmentin.  Patient did received ceftriaxone for the past 3 days. -Continue ceftriaxone -Add vancomycin -Continue to monitor

## 2022-10-16 NOTE — Consult Note (Signed)
ANTICOAGULATION CONSULT NOTE  Pharmacy Consult for Heparin Infusion Indication:  suspected PE  No Known Allergies  Patient Measurements: Height: 5\' 9"  (175.3 cm) Weight: 86.3 kg (190 lb 4.1 oz) IBW/kg (Calculated) : 70.7 Heparin Dosing Weight: 84.2 kg  Vital Signs: Temp: 100.7 F (38.2 C) (09/01 0345) Temp Source: Axillary (09/01 0345) BP: 109/57 (09/01 0400) Pulse Rate: 94 (09/01 0400)  Labs: Recent Labs    10/13/22 1714 10/13/22 1844 10/13/22 2102 10/14/22 0006 10/14/22 0234 10/14/22 1601 10/15/22 0127 10/15/22 0129 10/15/22 0955 10/15/22 1955 10/16/22 0347  HGB  --    < > 13.1  --  11.9*  --  11.0*  --   --   --  11.0*  HCT  --    < > 40.1  --  34.3*  --  31.9*  --   --   --  30.7*  PLT  --    < > 310  --  308  --  234  --   --   --  186  APTT  --   --   --   --   --  30  --   --   --   --   --   LABPROT  --   --  16.7*  --   --  18.1*  --   --   --   --   --   INR  --   --  1.3*  --   --  1.5*  --   --   --   --   --   HEPARINUNFRC  --   --   --   --   --   --   --   --  0.24* 0.22* 0.27*  CREATININE  --    < > 6.55*  --  6.43*  --   --  6.18*  --   --  4.32*  CKTOTAL  --   --   --  1,338*  --   --   --   --   --   --   --   TROPONINIHS 23*  --  25* 39*  --   --   --   --   --   --   --    < > = values in this interval not displayed.    Estimated Creatinine Clearance: 16.3 mL/min (A) (by C-G formula based on SCr of 4.32 mg/dL (H)).   Medical History: Past Medical History:  Diagnosis Date   Arthritis    Chest pain    Assessment: Joshua Mora is a 72 y.o. male presenting with AKI. PMH significant for HLD, T2DM. Patient was not on [facility] PTA per chart review. D-dimer 11.94. Chest Xray unrevealing. VQ scan negative. Upper extremity and lower extremity duplex ultrasound did not show DVT. Pharmacy has been consulted to initiate and manage heparin infusion.   Baseline Labs: aPTT 30, PT 18.1, INR 1.5, Hgb 11.9, Hct 34.3, Plt 308   Goal of Therapy:   Heparin level 0.3-0.7 units/ml Monitor platelets by anticoagulation protocol: Yes  Date Time HL Rate/Comment  8/31 0955 0.24 1350/subtherapeutic 8/31 1955 0.22 1550/subtherapeutic 9/01 0347 0.27 1750/subtherapeutic   Plan:  Give 1300 units bolus x 1 Start heparin infusion at 1950 units/hr Check HL in 8 hours  Continue to monitor H&H and platelets daily while on heparin infusion   Otelia Sergeant, PharmD, [facility] 10/16/2022 5:40 AM

## 2022-10-17 DIAGNOSIS — D72825 Bandemia: Secondary | ICD-10-CM

## 2022-10-17 DIAGNOSIS — N179 Acute kidney failure, unspecified: Secondary | ICD-10-CM | POA: Diagnosis not present

## 2022-10-17 DIAGNOSIS — J9601 Acute respiratory failure with hypoxia: Secondary | ICD-10-CM | POA: Diagnosis not present

## 2022-10-17 DIAGNOSIS — A419 Sepsis, unspecified organism: Secondary | ICD-10-CM | POA: Diagnosis not present

## 2022-10-17 DIAGNOSIS — R6520 Severe sepsis without septic shock: Secondary | ICD-10-CM

## 2022-10-17 DIAGNOSIS — N1832 Chronic kidney disease, stage 3b: Secondary | ICD-10-CM

## 2022-10-17 DIAGNOSIS — N170 Acute kidney failure with tubular necrosis: Secondary | ICD-10-CM | POA: Diagnosis not present

## 2022-10-17 LAB — CBC WITH DIFFERENTIAL/PLATELET
Abs Immature Granulocytes: 0.8 10*3/uL — ABNORMAL HIGH (ref 0.00–0.07)
Basophils Absolute: 0.1 10*3/uL (ref 0.0–0.1)
Basophils Relative: 0 %
Eosinophils Absolute: 0.1 10*3/uL (ref 0.0–0.5)
Eosinophils Relative: 0 %
HCT: 29.3 % — ABNORMAL LOW (ref 39.0–52.0)
Hemoglobin: 9.9 g/dL — ABNORMAL LOW (ref 13.0–17.0)
Immature Granulocytes: 2 %
Lymphocytes Relative: 5 %
Lymphs Abs: 1.8 10*3/uL (ref 0.7–4.0)
MCH: 30.2 pg (ref 26.0–34.0)
MCHC: 33.8 g/dL (ref 30.0–36.0)
MCV: 89.3 fL (ref 80.0–100.0)
Monocytes Absolute: 3.5 10*3/uL — ABNORMAL HIGH (ref 0.1–1.0)
Monocytes Relative: 10 %
Neutro Abs: 30.6 10*3/uL — ABNORMAL HIGH (ref 1.7–7.7)
Neutrophils Relative %: 83 %
Platelets: 190 10*3/uL (ref 150–400)
RBC: 3.28 MIL/uL — ABNORMAL LOW (ref 4.22–5.81)
RDW: 15.9 % — ABNORMAL HIGH (ref 11.5–15.5)
WBC: 37 10*3/uL — ABNORMAL HIGH (ref 4.0–10.5)
nRBC: 0.4 % — ABNORMAL HIGH (ref 0.0–0.2)

## 2022-10-17 LAB — BASIC METABOLIC PANEL
Anion gap: 13 (ref 5–15)
BUN: 87 mg/dL — ABNORMAL HIGH (ref 8–23)
CO2: 23 mmol/L (ref 22–32)
Calcium: 8.3 mg/dL — ABNORMAL LOW (ref 8.9–10.3)
Chloride: 99 mmol/L (ref 98–111)
Creatinine, Ser: 4.8 mg/dL — ABNORMAL HIGH (ref 0.61–1.24)
GFR, Estimated: 12 mL/min — ABNORMAL LOW (ref >60)
Glucose, Bld: 121 mg/dL — ABNORMAL HIGH (ref 70–99)
Potassium: 4.2 mmol/L (ref 3.5–5.1)
Sodium: 135 mmol/L (ref 135–145)

## 2022-10-17 LAB — MAGNESIUM: Magnesium: 2.1 mg/dL (ref 1.7–2.4)

## 2022-10-17 LAB — SEDIMENTATION RATE: Sed Rate: 92 mm/h — ABNORMAL HIGH (ref 0–20)

## 2022-10-17 LAB — GLUCOSE, CAPILLARY: Glucose-Capillary: 88 mg/dL (ref 70–99)

## 2022-10-17 LAB — C-REACTIVE PROTEIN: CRP: 31.4 mg/dL — ABNORMAL HIGH (ref <1.0)

## 2022-10-17 LAB — PROCALCITONIN: Procalcitonin: 31.3 ng/mL

## 2022-10-17 LAB — CK: Total CK: 23 U/L — ABNORMAL LOW (ref 49–397)

## 2022-10-17 LAB — PHOSPHORUS: Phosphorus: 4.7 mg/dL — ABNORMAL HIGH (ref 2.5–4.6)

## 2022-10-17 NOTE — Assessment & Plan Note (Addendum)
Some improvement today, pathologist smear review with immature granulocytes, initial smear review with bandemia.  Urine cultures with Klebsiella pneumonia but patient is being treated with appropriate antibiotics. Significantly elevated D-dimer which continued to get worse.  VTE has been ruled out with negative VQ scan and lower extremity venous Doppler. -Completed the course of antibiotic -Hematology consult-ordered some more labs, likely leukemoid reaction with severe inflammation and infection but if remain elevated he will need bone marrow biopsy for further evaluation -Bone marrow biopsy today

## 2022-10-17 NOTE — Consult Note (Signed)
Orthopedic Surgery Inpatient/ED Consultation Note  Reason for consult: Rule out left septic TKA  Date of consult: 10/17/22 Referring provider: Arnetha Courser, MD  Chief Complaint: Chief Complaint  Patient presents with   Weakness    HPI: Joshua Mora IS A male who is seen today regarding a complaint of left knee pain.  Patient was admitted on August 29 with sepsis and white count of 46,000 in addition to respiratory failure and acute kidney insufficiency.  He was treated for a UTI.  He has been on antibiotics.  His white count is persistently elevated and he has a history of a recent fall to his left knee where he had a total knee replacement about 2 decades ago.  Ongoing pain in that area has led for consult to assess whether or not the knee could be contributing to the sepsis picture.  Patient was seen bedside and his son was present  Ashby Dawes: Insidious, slowly worsening Location: Left knee Intensity: Constant aching Character: Sharp Modifying Factors: Somewhat stable Prior surgery: Prior total knee replacement 20 years ago Was there trauma: Yes, recent fall where x-rays showed a nondisplaced fibular neck fracture on the left  Other recent x-rays were of the right knee which shows degenerative process in the left ankle which shows a degenerative process  Past Medical History: Reviewed. Past Medical History:  Diagnosis Date   Arthritis    Chest pain     Past Surgical History: Reviewed. Past Surgical History:  Procedure Laterality Date   COLONOSCOPY WITH PROPOFOL N/A 09/12/2016   Procedure: COLONOSCOPY WITH PROPOFOL;  Surgeon: Scot Jun, MD;  Location: [facility] ENDOSCOPY;  Service: Endoscopy;  Laterality: N/A;   COLONOSCOPY WITH PROPOFOL N/A 04/02/2020   Procedure: COLONOSCOPY WITH PROPOFOL;  Surgeon: Pasty Spillers, MD;  Location: [facility] ENDOSCOPY;  Service: Endoscopy;  Laterality: N/A;   JOINT REPLACEMENT     LT TKA 1995   ROTATOR CUFF REPAIR Left 2013    SHOULDER ARTHROSCOPY WITH ROTATOR CUFF REPAIR Right 11/15/2017   Procedure: SHOULDER ARTHROSCOPY WITH ROTATOR CUFF REPAIR;  Surgeon: Lyndle Herrlich, MD;  Location: [facility] ORS;  Service: Orthopedics;  Laterality: Right;    Family History: Reviewed. The family history is not on file.  Social History: Reviewed. Social History   Socioeconomic History   Marital status: Single    Spouse name: Not on file   Number of children: Not on file   Years of education: Not on file   Highest education level: Not on file  Occupational History   Not on file  Tobacco Use   Smoking status: Every Day    Types: Cigarettes, Cigars   Smokeless tobacco: Never   Tobacco comments:    5 per day  Vaping Use   Vaping status: Never Used  Substance and Sexual Activity   Alcohol use: Yes    Comment: occ   Drug use: No   Sexual activity: Not on file  Other Topics Concern   Not on file  Social History Narrative   Not on file   Social Determinants of Health   Financial Resource Strain: Not on file  Food Insecurity: No Food Insecurity (10/13/2022)   Hunger Vital Sign    Worried About Running Out of Food in the Last Year: Never true    Ran Out of Food in the Last Year: Never true  Transportation Needs: No Transportation Needs (10/13/2022)   PRAPARE - Transportation    Lack of Transportation (Medical): No    Lack of  Transportation (Non-Medical): No  Physical Activity: Not on file  Stress: Not on file  Social Connections: Not on file    Home Meds: Reviewed. Prior to Admission medications   Medication Sig Start Date End Date Taking? Authorizing Provider  Acetaminophen Extra Strength 500 MG TABS Take 500-1,000 mg by mouth every 4 (four) hours as needed (Pain). 10/06/22  Yes [provider]  IBU 800 MG tablet Take 800 mg by mouth every 8 (eight) hours as needed. 10/06/22  Yes [provider]  meclizine (ANTIVERT) 25 MG tablet Take 25 mg by mouth every 6 (six) hours as needed. 09/09/20  Yes  [provider]  meloxicam (MOBIC) 15 MG tablet Take 15 mg by mouth daily. 09/09/20  Yes [provider]  traMADol (ULTRAM) 50 MG tablet Take 50 mg by mouth 2 (two) times daily as needed.   Yes [provider]  VIAGRA 100 MG tablet Take 100 mg by mouth as needed for erectile dysfunction. 09/09/20  Yes [provider]  vitamin B-12 (CYANOCOBALAMIN) 1000 MCG tablet Take 1,000 mcg by mouth daily. 09/09/20  Yes [provider]  HYDROcodone-acetaminophen (NORCO/VICODIN) 5-325 MG tablet Take 1 tablet by mouth every 6 (six) hours as needed. Patient not taking: Reported on 10/14/2022 08/19/22   [facility] Medications: Reviewed. Current Facility-Administered Medications  Medication Dose Route Frequency Provider Last Rate Last Admin   acetaminophen (TYLENOL) tablet 650 mg  650 mg Oral Q6H PRN Andris Baumann, MD   650 mg at 10/16/22 2020   alteplase (CATHFLO ACTIVASE) injection 2 mg  2 mg Intracatheter Once PRN Wendee Beavers, NP       ascorbic acid (VITAMIN C) tablet 500 mg  500 mg Oral BID Hall, Carole N, DO   500 mg at 10/17/22 0913   cefTRIAXone (ROCEPHIN) 2 g in sodium chloride 0.9 % 100 mL IVPB  2 g Intravenous Q24H Hall, Carole N, DO   Stopped at 10/16/22 1611   Chlorhexidine Gluconate Cloth 2 % PADS 6 each  6 each Topical Daily Arnetha Courser, MD   6 each at 10/17/22 0914   Chlorhexidine Gluconate Cloth 2 % PADS 6 each  6 each Topical Q0600 Wendee Beavers, NP       chlorproMAZINE (THORAZINE) tablet 10 mg  10 mg Oral TID PRN Arnetha Courser, MD   10 mg at 10/16/22 1221   cyanocobalamin (VITAMIN B12) tablet 1,000 mcg  1,000 mcg Oral Daily Arnetha Courser, MD   1,000 mcg at 10/17/22 0914   feeding supplement (ENSURE ENLIVE / ENSURE PLUS) liquid 237 mL  237 mL Oral TID BM Dow Adolph N, DO   237 mL at 10/17/22 0914   folic acid (FOLVITE) tablet 1 mg  1 mg Oral Daily Espino, Carole N, DO   1 mg at 10/17/22 0914   heparin injection 1,000  Units  1,000 Units Intracatheter PRN Wendee Beavers, NP   2,800 Units at 10/15/22 2240   heparin injection 5,000 Units  5,000 Units Subcutaneous Q8H Mila Merry A, RPH   5,000 Units at 10/17/22 1610   melatonin tablet 5 mg  5 mg Oral QHS PRN Darlin Drop, DO       multivitamin with minerals tablet 1 tablet  1 tablet Oral Daily Dow Adolph N, DO   1 tablet at 10/17/22 9604   nicotine (NICODERM CQ - dosed in mg/24 hours) patch 14 mg  14 mg Transdermal Daily Arnetha Courser, MD   14 mg  at 10/17/22 0914   ondansetron (ZOFRAN) tablet 4 mg  4 mg Oral Q6H PRN Arnetha Courser, MD       Or   ondansetron (ZOFRAN) injection 4 mg  4 mg Intravenous Q6H PRN Arnetha Courser, MD   4 mg at 10/14/22 0854   Oral care mouth rinse  15 mL Mouth Rinse PRN Arnetha Courser, MD       polyethylene glycol (MIRALAX / GLYCOLAX) packet 17 g  17 g Oral Daily PRN Arnetha Courser, MD       prochlorperazine (COMPAZINE) injection 5 mg  5 mg Intravenous Q6H PRN Dow Adolph N, DO       thiamine (VITAMIN B1) tablet 100 mg  100 mg Oral Daily Dow Adolph N, DO   100 mg at 10/17/22 4401   Or   thiamine (VITAMIN B1) injection 100 mg  100 mg Intravenous Daily Dow Adolph N, DO   100 mg at 10/15/22 0947   traMADol (ULTRAM) tablet 50 mg  50 mg Oral Q8H PRN Arnetha Courser, MD   50 mg at 10/17/22 0414   vancomycin variable dose per unstable renal function (pharmacist dosing)   Does not apply See admin instructions Barrie Folk, RPH       Vitamin D (Ergocalciferol) (DRISDOL) 1.25 MG (50000 UNIT) capsule 50,000 Units  50,000 Units Oral Q7 days Darlin Drop, DO   50,000 Units at 10/14/22 1638    Allergies: Reviewed. No Known Allergies  Review of Systems: General ROS: negative for - fever Psychological ROS: negative Ophthalmic ROS: negative ENT ROS: negative Allergy and Immunology ROS: negative Hematological and Lymphatic ROS: negative Endocrine ROS: negative Cardiovascular ROS: negative Gastrointestinal ROS:  negative Musculoskeletal ROS: negative except HPI Neurological ROS: negative Dermatological ROS: negative  Physical Exam: @VITALSLAST @ Body mass index is 29.3 kg/m.    @RRVITAL @   General Appearance: alert, appears stated age, cooperative, in no acute distress Head: Normocephalic, without obvious abnormality, atraumatic Eyes: No scleral injection or drainage Ears: Gross hearing intact Neck: No JVD, supple, symmetrical, trachea midline Lungs: Non-labored breathing Heart: Regular rate and rhythm Extremities: The left knee has a scab over the anterior aspect inferior aspect of the patella.  It is somewhat crusted over there is no drainage there is no erythema but the skin in that area and the knee in general is warmer than on the right side.  There is some joint line tenderness.  The knee is not particularly swollen.  There is no significant erythema.  Range of motion is diminished due to some pain but mostly due to patient deconditioning Pulses: 2+ and symmetric Skin: Normal except as noted in examination Neurologic: Grossly normal   Imaging/Diagnostics: X-rays of the left knee noted above which show a nondisplaced fibular neck fracture and intact arthroplasty components   Labs: I have reviewed all of the pertinent labs in the patient chart. WBC  Date Value Ref Range Status  10/17/2022 37.0 (H) 4.0 - 10.5 K/uL Final   Hemoglobin  Date Value Ref Range Status  10/17/2022 9.9 (L) 13.0 - 17.0 g/dL Final   HCT  Date Value Ref Range Status  10/17/2022 29.3 (L) 39.0 - 52.0 % Final   Sodium  Date Value Ref Range Status  10/17/2022 135 135 - 145 mmol/L Final   Potassium  Date Value Ref Range Status  10/17/2022 4.2 3.5 - 5.1 mmol/L Final   Chloride  Date Value Ref Range Status  10/17/2022 99 98 - 111 mmol/L Final   BUN  Date Value Ref Range Status  10/17/2022 87 (H) 8 - 23 mg/dL Final   INR  Date Value Ref Range Status  10/16/2022 1.4 (H) 0.8 - 1.2 Final     Comment:    (NOTE) INR goal varies based on device and disease states. Performed at [facility], [address] [place], [place] [address]    B Natriuretic Peptide  Date Value Ref Range Status  10/15/2022 328.0 (H) 0.0 - 100.0 pg/mL Final    Comment:    Performed at [facility], [address] [place], [place] [address]   TSH  Date Value Ref Range Status  10/13/2022 0.439 0.350 - 4.500 uIU/mL Final    Comment:    Performed by a 3rd Generation assay with a functional sensitivity of <=0.01 uIU/mL. Performed at [facility], [address], [place], [place] [address]        Assessment/Plan:  This 72 year old male was admitted with sepsis on August 29 and has a significantly elevated white blood cell count which is persistent, 46,000 on admission, other active issues include renal impairment as well as acute respiratory failure.  He is in the ICU currently.  Source of the infection other than his UTI which was treated with the appropriate antibiotic has not been determined that would be causing his elevated white count.  Given the recent history of trauma and the wound on his knee source of the total knee may be considered for infection.  His physical exam does show some asymmetric warmth on that knee compared to the other side as well as of the wound and some joint line tenderness.  I discussed an aspiration with the patient and his son.  He has been on antibiotics for a while and so the aspiration results may not be completely reliable but plan would be to aspirate and check for Gram stain culture crystal and cell count.  The son's questions were answered the patient's questions were answered they desired to proceed with the aspiration.  Consent was obtained and the knee was aspirated using standard sterile conditions and I used the inferior lateral portal.  I retrieved about 4 to 5 cc of synovial appearing fluid that did not have the appearance of  infection.  The fluid was sent for Gram stain, crystals, cell count, and culture.  At this point orthopedic recommendation is as follows 1.  Daily wound care to the anterior left knee 2.  Weightbearing as tolerated without restriction, PT (order placed) 3.  I do not think he needs to wear his bedside knee brace  We will follow along and reassess as the results of the aspiration come back.   Thank you.   Aili Casillas, [facility]o [facility]        Signed: Nada Boozer, MD, Orthopedic Surgeon [facility]  9/2/20242:33 PM  Note: Dictation was assisted using commercial voice recognition software.There may be uncorrected typographical errors within the document.  Please be aware of this information when reading or considering this document. Thank you.

## 2022-10-17 NOTE — Assessment & Plan Note (Addendum)
Patient with poor p.o. intake, BUN of 72 and creatinine of 6.30 on admission, it was 1.74 early July when he was first seen with fall. Renal ultrasound negative for any obstruction, noted an ill-defined echogenic area in the right upper pole of kidney which can be followed up later on with designated CT scan. Patient was also using NSAID regularly. -Nephrology consult-patient received first dialysis on 10/15/2022, renal function although stable , poor p.o. intake. Patient received total of 2 dialysis so far.  Last 1 was yesterday. Temporary catheter was removed and nephrology will observe over the weekend -Avoid nephrotoxins

## 2022-10-17 NOTE — Progress Notes (Signed)
Progress Note   Patient: Joshua Mora ZOX:[id number] DOB: 1950-06-04 DOA: 10/13/2022     4 DOS: the patient was seen and examined on 10/17/2022   Brief hospital course: Taken from prior notes.  Emersen Barcena is a 72 y.o. male with medical history significant of alcohol abuse, tobacco use disorder, diet controlled prediabetes, CKD 3B, osteoarthritis s/p left knee replacement who presented to the ED with complaints of progressive weakness and frequent falls over the past 1 month.     On presentation he was found to have severely elevated creatinine above baseline for which nephrology was consulted.  Also with significant leukocytosis above 46,000 with concern for leukemoid reaction in the setting of presumptive UTI.  Trauma imaging were nonacute.  CT head also nonacute.  Hospital course was complicated by acute hypoxic respiratory failure.  Was found to have mild pulmonary edema on chest x-ray.  Elevated D-dimer above 11 with concern for possible pulmonary embolism.  Heparin drip initiated.  9/1: Overnight became febrile at 100.7.  Urine cultures with Klebsiella pneumonia which are sensitive to ceftriaxone.  Had bilateral knee injuries which does not appear infected.  Worsening leukocytosis.  VQ scan negative for PE, lower extremity venous Doppler negative for DVT, discontinuing heparin infusion.  Adding vancomycin to broaden up the coverage.  Also concern of Leukomed reaction versus any underlying undiagnosed leukemia-hematology was consulted. Also ordered labs to rule out DIC due to worsening D-dimer.  9/2: Some improvement in leukocytosis, hematology ordered few more labs, if he continued to have persistently elevated white cell count then he might need bone marrow for further evaluation. Also consulted orthopedic surgery to get their opinion on this recent left nondisplaced fibular neck fracture, slight worsening of creatinine but good UOP.  Nephrology would like to wait before  proceeding for another dialysis.  Assessment and Plan: * Acute renal failure superimposed on stage 3b chronic kidney disease (HCC) Patient with poor p.o. intake, BUN of 72 and creatinine of 6.30 on admission, it was 1.74 early July when he was first seen with fall. Renal ultrasound negative for any obstruction, noted an ill-defined echogenic area in the right upper pole of kidney which can be followed up later on with designated CT scan. Patient was also using NSAID regularly. -Nephrology consult-patient was started on dialysis yesterday, nephrology will monitor closely day by day to determine further need at this time -Monitor renal function -Avoid nephrotoxins  Severe sepsis ) Patient met sepsis criteria with tachycardia, severe leukocytosis and severe sepsis with lactic acidosis above 4.3.  Initial procalcitonin was 60.67 which is decreasing. Did develop a low-grade fever overnight again.  Procalcitonin improving.  Preliminary blood cultures negative, urine cultures with Klebsiella pneumonia which shows only resistance to ampicillin and Augmentin.  Patient did received ceftriaxone for the past 3 days.  Vancomycin was added on 10/16/2022 -Continue ceftriaxone -Continue vancomycin -Continue to monitor  Leukocytosis Slight improvement in leukocytosis today, initial smear review with bandemia.  Urine cultures with Klebsiella pneumonia but patient is being treated with appropriate antibiotics. Significantly elevated D-dimer which continued to get worse.  VTE has been ruled out with negative VQ scan and lower extremity venous Doppler. -Continue with ceftriaxone and vancomycin for now -Hematology consult-ordered some more labs, likely leukemoid reaction with severe inflammation and infection but if remain elevated he will need bone marrow biopsy for further evaluation  Elevated d-dimer Patient with worsening D-dimer, now at 13.  COVID was negative. DVT has been ruled out with negative VQ scan  and lower extremity venous Doppler. -Stop heparin infusion -Checking labs for DIC -Continue to monitor  Acute hypoxemic respiratory failure (HCC) Patient did develop acute hypoxic respiratory failure and repeat chest x-ray at that time with concern of pulmonary congestion.  Echocardiogram with normal EF and grade 1 diastolic dysfunction.  VQ scan negative for PE Patient with significant AKI, also concern of NSAID induced renal injury. Started on dialysis. -Continue IV Lasix -Continue supplemental oxygen-wean as tolerated  Frequent falls Patient with history of recent decline, poor appetite and multiple falls.  First fall was on July 4 and since then he was falling couple of times a week.  Left knee imaging done on 8/23 with a concern of nondisplaced fibular neck fracture, Initial plan was outpatient follow-up with orthopedic surgery. Requested orthopedic surgery to evaluate while he is in hospital B12 normal, significantly low vitamin D-started on supplement. PT/OT are recommending SNF  Nicotine dependence, uncomplicated -Nicotine patch  Traumatic rhabdomyolysis (HCC) Likely secondary to recurrent falls.      Subjective: Patient was seen and examined today.  Continue to have pain in left knee and ankle.  Ate some breakfast today.  Physical Exam: Vitals:   10/17/22 0600 10/17/22 0900 10/17/22 1000 10/17/22 1100  BP: (!) 116/57 133/64 123/60 (!) 124/56  Pulse: 85 61 95 98  Resp: 15 16 17 18   Temp:      TempSrc:      SpO2: 90% 95% 95% 95%  Weight:      Height:       General.  Chronically ill-appearing elderly man, in no acute distress. Pulmonary.  Lungs clear bilaterally, normal respiratory effort. CV.  Regular rate and rhythm, no JVD, rub or murmur. Abdomen.  Soft, nontender, nondistended, BS positive. CNS.  Alert and oriented .  No focal neurologic deficit. Extremities.  No edema, no cyanosis, pulses intact and symmetrical.  Left knee with brace, bruising around left  ankle Psychiatry.  Judgment and insight appears normal.   Data Reviewed: Prior data reviewed  Family Communication: Discussed with daughter at bedside  Disposition: Status is: Inpatient Remains inpatient appropriate because: Severity of illness  Planned Discharge Destination: Skilled nursing facility  DVT prophylaxis.  Subcu heparin Time spent: 50 minutes  This record has been created using Conservation officer, historic buildings. Errors have been sought and corrected,but may not always be located. Such creation errors do not reflect on the standard of care.   Author: Arnetha Courser, MD 10/17/2022 1:29 PM  For on call review [email or web address].

## 2022-10-17 NOTE — Assessment & Plan Note (Addendum)
Patient met sepsis criteria with tachycardia, severe leukocytosis and severe sepsis with lactic acidosis above 4.3.  Initial procalcitonin was 60.67 which is decreasing. Did develop a low-grade fever overnight again.  Procalcitonin improving.  Preliminary blood cultures negative, urine cultures with Klebsiella pneumonia which shows only resistance to ampicillin and Augmentin.  Patient did received ceftriaxone for the past 3 days.  Vancomycin was added on 10/16/2022.  Left knee aspirate with no growth so far. -ID consult -Completed the course of antibiotic and ID is advising monitoring without antibiotic for now. -Continue to monitor

## 2022-10-17 NOTE — Assessment & Plan Note (Signed)
Patient with history of recent decline, poor appetite and multiple falls.  First fall was on July 4 and since then he was falling couple of times a week.  Left knee imaging done on 8/23 with a concern of nondisplaced fibular neck fracture, Initial plan was outpatient follow-up with orthopedic surgery. Requested orthopedic surgery to evaluate while he is in hospital B12 normal, significantly low vitamin D-started on supplement. PT/OT are recommending SNF

## 2022-10-17 NOTE — Progress Notes (Signed)
[facility] [place], [place] 10/17/22  Subjective:   Hospital day # 4  Remains critically ill.  Daughter at bedside. Received his first dialysis treatment on Saturday.  Total of 300 cc was removed. Urine output of 600 cc from last 24 hours. Patient and his daughter report that overall he is more alert.  Was able to eat breakfast and lunch yesterday. Continues to require [place] O2  Renal: 09/01 0701 - 09/02 0700 In: 1248.3 [P.O.:720; I.V.:78.3; IV Piggyback:450] Out: 600 [Urine:600] Lab Results  Component Value Date   CREATININE 4.80 (H) 10/17/2022   CREATININE 4.32 (H) 10/16/2022   CREATININE 6.18 (H) 10/15/2022     Objective:  Vital signs in last 24 hours:  Temp:  [97.5 F (36.4 C)-100.3 F (37.9 C)] 98 F (36.7 C) (09/02 0400) Pulse Rate:  [61-103] 98 (09/02 1100) Resp:  [14-25] 18 (09/02 1100) BP: (91-133)/(48-66) 124/56 (09/02 1100) SpO2:  [90 %-97 %] 95 % (09/02 1100) Weight:  [90 kg] 90 kg (09/02 0500)  Weight change: 3.7 kg Filed Weights   10/15/22 0400 10/16/22 0300 10/17/22 0500  Weight: 87.2 kg 86.3 kg 90 kg    Intake/Output:    Intake/Output Summary (Last 24 hours) at 10/17/2022 1120 Last data filed at 10/17/2022 1100 Gross per 24 hour  Intake 1410 ml  Output 800 ml  Net 610 ml     Physical Exam: General: Critically ill-appearing  HEENT Moist oral mucous membranes  Pulm/lungs [place] O2  CVS/Heart Irregular  Abdomen:  Soft, mildly distended, nontender  Extremities: Dependent edema present  Neurologic: Alert, responds to few simple commands  Skin: Warm  Access: Right IJ temp cath       Basic Metabolic Panel:  Recent Labs  Lab 10/13/22 2102 10/14/22 0234 10/15/22 0129 10/16/22 0347 10/17/22 0327  NA 144 144 140 138 135  K 4.6 3.4* 3.9 3.8 4.2  CL 111 106 104 101 99  CO2 15* 18* 24 23 23   GLUCOSE 106* 116* 131* 121* 121*  BUN 75* 77* 80* 67* 87*  CREATININE 6.55* 6.43* 6.18* 4.32* 4.80*  CALCIUM 8.7* 9.0 8.1* 8.2* 8.3*   MG  --   --  1.8 1.8 2.1  PHOS  --   --  4.2 3.6 4.7*     CBC: Recent Labs  Lab 10/13/22 1844 10/13/22 2102 10/14/22 0234 10/15/22 0127 10/16/22 0347 10/17/22 0327  WBC 45.3* 27.8* 49.9* 44.9* 51.4* 37.0*  NEUTROABS 39.3* 25.6*  --  37.8*  --  30.6*  HGB 13.2 13.1 11.9* 11.0* 11.0* 9.9*  HCT 40.1 40.1 34.3* 31.9* 30.7* 29.3*  MCV 91.8 93.3 88.6 90.1 88.0 89.3  PLT 371 310 308 234 186 190      Lab Results  Component Value Date   HEPBSAG NON REACTIVE 10/15/2022   HEPBIGM NON REACTIVE 10/15/2022      Microbiology:  Recent Results (from the past 240 hour(s))  SARS Coronavirus 2 by RT PCR (hospital order, performed in [facility] hospital lab) *cepheid single result test* Anterior Nasal Swab     Status: None   Collection Time: 10/13/22  3:55 PM   Specimen: Anterior Nasal Swab  Result Value Ref Range Status   SARS Coronavirus 2 by RT PCR NEGATIVE NEGATIVE Final    Comment: (NOTE) SARS-CoV-2 target nucleic acids are NOT DETECTED.  The SARS-CoV-2 RNA is generally detectable in upper and lower respiratory specimens during the acute phase of infection. The lowest concentration of SARS-CoV-2 viral copies this assay can detect is  250 copies / mL. A negative result does not preclude SARS-CoV-2 infection and should not be used as the sole basis for treatment or other patient management decisions.  A negative result may occur with improper specimen collection / handling, submission of specimen other than nasopharyngeal swab, presence of viral mutation(s) within the areas targeted by this assay, and inadequate number of viral copies (<250 copies / mL). A negative result must be combined with clinical observations, patient history, and epidemiological information.  Fact Sheet for Patients:   RoadLapTop.co.za  Fact Sheet for Healthcare Providers: http://kim-miller.com/  This test is not yet approved or  cleared by the [place] FDA and has been authorized for detection and/or diagnosis of SARS-CoV-2 by FDA under an Emergency Use Authorization (EUA).  This EUA will remain in effect (meaning this test can be used) for the duration of the COVID-19 declaration under Section 564(b)(1) of the Act, 21 U.S.C. section 360bbb-3(b)(1), unless the authorization is terminated or revoked sooner.  Performed at [facility], [address] [place], [place] [address]   MRSA Next Gen by PCR, Nasal     Status: None   Collection Time: 10/13/22  9:51 PM   Specimen: Nasal Mucosa; Nasal Swab  Result Value Ref Range Status   MRSA by PCR Next Gen NOT DETECTED NOT DETECTED Final    Comment: (NOTE) The GeneXpert MRSA Assay (FDA approved for NASAL specimens only), is one component of a comprehensive MRSA colonization surveillance program. It is not intended to diagnose MRSA infection nor to guide or monitor treatment for MRSA infections. Test performance is not FDA approved in patients less than 61 years old. Performed at [facility], [address], [place], [place] [address]   Urine Culture     Status: Abnormal   Collection Time: 10/14/22  7:32 AM   Specimen: Urine, Random  Result Value Ref Range Status   Specimen Description   Final    URINE, RANDOM Performed at [facility], [address] [place], [place] [address]    Special Requests   Final    NONE Reflexed from [id number] Performed at [facility], [address] [place], [place] [address]    Culture >=100,000 COLONIES/mL KLEBSIELLA PNEUMONIAE (A)  Final   Report Status 10/16/2022 FINAL  Final   Organism ID, Bacteria KLEBSIELLA PNEUMONIAE (A)  Final      Susceptibility   Klebsiella pneumoniae - MIC*    AMPICILLIN >=32 RESISTANT Resistant     CEFAZOLIN <=4 SENSITIVE Sensitive     CEFEPIME <=0.12 SENSITIVE Sensitive     CEFTRIAXONE <=0.25 SENSITIVE Sensitive     CIPROFLOXACIN <=0.25 SENSITIVE Sensitive     GENTAMICIN  <=1 SENSITIVE Sensitive     IMIPENEM <=0.25 SENSITIVE Sensitive     NITROFURANTOIN 128 RESISTANT Resistant     TRIMETH/SULFA <=20 SENSITIVE Sensitive     AMPICILLIN/SULBACTAM 8 SENSITIVE Sensitive     PIP/TAZO <=4 SENSITIVE Sensitive     * >=100,000 COLONIES/mL KLEBSIELLA PNEUMONIAE  Culture, blood (Routine X 2) w Reflex to ID Panel     Status: None (Preliminary result)   Collection Time: 10/15/22  1:29 AM   Specimen: Right Antecubital; Blood  Result Value Ref Range Status   Specimen Description RIGHT ANTECUBITAL  Final   Special Requests   Final    BOTTLES DRAWN AEROBIC AND ANAEROBIC Blood Culture adequate volume   Culture   Final    NO GROWTH 2 DAYS Performed at [facility], [address] [place], [place] [address]    Report Status PENDING  Incomplete  Culture, blood (Routine X 2) w Reflex to ID Panel     Status: None (Preliminary result)   Collection Time: 10/15/22 11:41 AM   Specimen: BLOOD  Result Value Ref Range Status   Specimen Description BLOOD BLOOD RIGHT HAND  Final   Special Requests   Final    BOTTLES DRAWN AEROBIC ONLY Blood Culture results may not be optimal due to an inadequate volume of blood received in culture bottles   Culture   Final    NO GROWTH 2 DAYS Performed at [facility], [address], [place], [place] [address]    Report Status PENDING  Incomplete    Coagulation Studies: Recent Labs    10/14/22 1601 10/16/22 1347  LABPROT 18.1* 17.1*  INR 1.5* 1.4*    Urinalysis: No results for input(s): "COLORURINE", "LABSPEC", "PHURINE", "GLUCOSEU", "HGBUR", "BILIRUBINUR", "KETONESUR", "PROTEINUR", "UROBILINOGEN", "NITRITE", "LEUKOCYTESUR" in the last 72 hours.  Invalid input(s): "APPERANCEUR"     Imaging: NM Pulmonary Perfusion  Result Date: 10/15/2022 CLINICAL DATA:  Short of breath.  Concern for pulmonary embolism. EXAM: NUCLEAR MEDICINE PERFUSION LUNG SCAN TECHNIQUE: Perfusion images were obtained in multiple projections  after intravenous injection of radiopharmaceutical. Patient imaged with LEFT arm down RADIOPHARMACEUTICALS:  4.7 mCi Tc-66m MAA COMPARISON:  Radiograph 10/15/2022 FINDINGS: No wedge-shaped peripheral perfusion defect within LEFT or RIGHT lung to suggest acute pulmonary embolism. Decreased regional perfusion of the lung apices suggest emphysema. IMPRESSION: No evidence acute pulmonary embolism. Electronically Signed   By: Genevive Bi M.D.   On: 10/15/2022 13:59     Medications:    cefTRIAXone (ROCEPHIN)  IV Stopped (10/16/22 1611)    vitamin C  500 mg Oral BID   Chlorhexidine Gluconate Cloth  6 each Topical Daily   Chlorhexidine Gluconate Cloth  6 each Topical Q0600   cyanocobalamin  1,000 mcg Oral Daily   feeding supplement  237 mL Oral TID BM   folic acid  1 mg Oral Daily   heparin injection (subcutaneous)  5,000 Units Subcutaneous Q8H   multivitamin with minerals  1 tablet Oral Daily   nicotine  14 mg Transdermal Daily   thiamine  100 mg Oral Daily   Or   thiamine  100 mg Intravenous Daily   vancomycin variable dose per unstable renal function (pharmacist dosing)   Does not apply See admin instructions   Vitamin D (Ergocalciferol)  50,000 Units Oral Q7 days   acetaminophen, alteplase, chlorproMAZINE, heparin, melatonin, ondansetron **OR** ondansetron (ZOFRAN) IV, mouth rinse, polyethylene glycol, prochlorperazine, traMADol  Assessment/ Plan:  72 y.o. male with  tobacco use, alcohol abuse, osteoarthritis status post left knee replacement and hyperlipidemia, frequent falls over the past few weeks since July 4 th admitted on 10/13/2022 for AKI (acute kidney injury) (HCC) [N17.9]   Acute Kidney Injury on chronic kidney disease stage IIIB   Baseline creatinine of 1.74, GFR of 41 on 08/18/22 Suspect patient's acute kidney injury secondary to NSAID induced nephropathy and concurrent sepsis. No history of diabetes or hypertension per patient.Renal ultrasound with no obstruction. No IV  contrast. -Underwent first hemodialysis treatment on 10/15/2022.  Tolerated well.   -Electrolytes and volume status are acceptable.  Urine output has improved some.  Will watch for now.  No acute indication for dialysis at present. -Reevaluate for ongoing need of hemodialysis on a daily basis.  Sepsis Urinary source-urine culture from 10/14/2022 is growing Klebsiella. Currently being treated with iv Rocephin  and Vancomycin   LOS: 4 Elleah Hemsley 9/2/202411:20 AM  [facility] [place], [place] [phone]  Note: This note was prepared with Dragon dictation. Any transcription errors are unintentional

## 2022-10-18 DIAGNOSIS — D72825 Bandemia: Secondary | ICD-10-CM | POA: Diagnosis not present

## 2022-10-18 DIAGNOSIS — D72829 Elevated white blood cell count, unspecified: Secondary | ICD-10-CM | POA: Diagnosis not present

## 2022-10-18 DIAGNOSIS — A419 Sepsis, unspecified organism: Secondary | ICD-10-CM | POA: Diagnosis not present

## 2022-10-18 DIAGNOSIS — J9601 Acute respiratory failure with hypoxia: Secondary | ICD-10-CM | POA: Diagnosis not present

## 2022-10-18 DIAGNOSIS — N179 Acute kidney failure, unspecified: Secondary | ICD-10-CM | POA: Diagnosis not present

## 2022-10-18 DIAGNOSIS — R509 Fever, unspecified: Secondary | ICD-10-CM | POA: Diagnosis not present

## 2022-10-18 DIAGNOSIS — R296 Repeated falls: Secondary | ICD-10-CM | POA: Diagnosis not present

## 2022-10-18 LAB — GLUCOSE, CAPILLARY: Glucose-Capillary: 98 mg/dL (ref 70–99)

## 2022-10-18 LAB — CBC WITH DIFFERENTIAL/PLATELET
Abs Immature Granulocytes: 0.9 10*3/uL — ABNORMAL HIGH (ref 0.00–0.07)
Basophils Absolute: 0.1 10*3/uL (ref 0.0–0.1)
Basophils Relative: 0 %
Eosinophils Absolute: 0.1 10*3/uL (ref 0.0–0.5)
Eosinophils Relative: 0 %
HCT: 29.2 % — ABNORMAL LOW (ref 39.0–52.0)
Hemoglobin: 10 g/dL — ABNORMAL LOW (ref 13.0–17.0)
Immature Granulocytes: 3 %
Lymphocytes Relative: 7 %
Lymphs Abs: 2.1 10*3/uL (ref 0.7–4.0)
MCH: 30.8 pg (ref 26.0–34.0)
MCHC: 34.2 g/dL (ref 30.0–36.0)
MCV: 89.8 fL (ref 80.0–100.0)
Monocytes Absolute: 3.6 10*3/uL — ABNORMAL HIGH (ref 0.1–1.0)
Monocytes Relative: 12 %
Neutro Abs: 24.7 10*3/uL — ABNORMAL HIGH (ref 1.7–7.7)
Neutrophils Relative %: 78 %
Platelets: 201 10*3/uL (ref 150–400)
RBC: 3.25 MIL/uL — ABNORMAL LOW (ref 4.22–5.81)
RDW: 16.3 % — ABNORMAL HIGH (ref 11.5–15.5)
WBC: 31.5 10*3/uL — ABNORMAL HIGH (ref 4.0–10.5)
nRBC: 0.2 % (ref 0.0–0.2)

## 2022-10-18 LAB — RENAL FUNCTION PANEL
Albumin: 2.1 g/dL — ABNORMAL LOW (ref 3.5–5.0)
Anion gap: 12 (ref 5–15)
BUN: 91 mg/dL — ABNORMAL HIGH (ref 8–23)
CO2: 23 mmol/L (ref 22–32)
Calcium: 8.6 mg/dL — ABNORMAL LOW (ref 8.9–10.3)
Chloride: 100 mmol/L (ref 98–111)
Creatinine, Ser: 4.71 mg/dL — ABNORMAL HIGH (ref 0.61–1.24)
GFR, Estimated: 12 mL/min — ABNORMAL LOW (ref >60)
Glucose, Bld: 115 mg/dL — ABNORMAL HIGH (ref 70–99)
Phosphorus: 5.6 mg/dL — ABNORMAL HIGH (ref 2.5–4.6)
Potassium: 4.6 mmol/L (ref 3.5–5.1)
Sodium: 135 mmol/L (ref 135–145)

## 2022-10-18 LAB — IRON AND TIBC
Iron: 19 ug/dL — ABNORMAL LOW (ref 45–182)
Saturation Ratios: 13 % — ABNORMAL LOW (ref 17.9–39.5)
TIBC: 151 ug/dL — ABNORMAL LOW (ref 250–450)
UIBC: 132 ug/dL

## 2022-10-18 LAB — KAPPA/LAMBDA LIGHT CHAINS
Kappa free light chain: 96.2 mg/L — ABNORMAL HIGH (ref 3.3–19.4)
Kappa, lambda light chain ratio: 1.65 (ref 0.26–1.65)
Lambda free light chains: 58.3 mg/L — ABNORMAL HIGH (ref 5.7–26.3)

## 2022-10-18 LAB — HEPATITIS B SURFACE ANTIBODY, QUANTITATIVE: Hep B S AB Quant (Post): <3.5 m[IU]/mL — ABNORMAL LOW

## 2022-10-18 NOTE — Consult Note (Signed)
[facility]  Total days of antibiotics 5 Reason for Consult:leukocytosis and fevers    Referring Physician: amin  Principal Problem:   Acute renal failure superimposed on stage 3b chronic kidney disease (HCC) Active Problems:   Nicotine dependence, uncomplicated   Leukocytosis   Frequent falls   Severe sepsis (HCC)   Traumatic rhabdomyolysis (HCC)   Acute hypoxemic respiratory failure (HCC)   Elevated d-dimer    HPI: Joshua Mora is a 72 y.o. male CKD, hx of left knee TKA, admitted on 8/30 found to have fevers up to 101F in adidtion to marked leukocytosis of 50K. His infectious work up revealed uti with klebsiella, but no bacteremia. He has been on appriopriate abtx but fevers and leukocytosis not appreciably improved. Wbc now at 31.5K, temp at 99.61F.  The patient lives with family member, and his son lives close by who was able to give [place] HPI. The patient has had increasing unsteadiness this summer. He has had multiple falls, including falling through a glass window, with abrasion and bruising to left arm, left leg, left knee/ankle. He has been alternating tylenol 500mg  with ibuprofen 800mg  (!) and meloxicam over the last 1-2 months. AT least one ED visit on 7/5, mentioning falls, laceration to right ankle requiring repair. His meds list at that time had meclizine.   He is now readmitted on 8/29 for worsening weakness and falls in the past 2-3 wk. Denied fevers, but occasional shortness of breath. On admit, he was found to be tachycardic to 120s. Labs significant for 45K with increased bands of 20%., cr of 6.3 up from baseline of 1.7.  Infectious work up showed + urine cx, klebsiella-but not clear if patient had dysuria, did have decrease urine output per his son.  Cxr showing vascular congestion  Since admit, having intermittent fevers, leukocytosis still elevated from 45 to 31K. No change with abtx for the past 5 days.  Patient not necessarily  feeling improved since admit.  Past Medical History:  Diagnosis Date   Arthritis    Chest pain     Allergies: No Known Allergies  MEDICATIONS:  vitamin C  500 mg Oral BID   Chlorhexidine Gluconate Cloth  6 each Topical Daily   Chlorhexidine Gluconate Cloth  6 each Topical Q0600   cyanocobalamin  1,000 mcg Oral Daily   feeding supplement  237 mL Oral TID BM   folic acid  1 mg Oral Daily   heparin injection (subcutaneous)  5,000 Units Subcutaneous Q8H   multivitamin with minerals  1 tablet Oral Daily   nicotine  14 mg Transdermal Daily   thiamine  100 mg Oral Daily   Or   thiamine  100 mg Intravenous Daily   vancomycin variable dose per unstable renal function (pharmacist dosing)   Does not apply See admin instructions   Vitamin D (Ergocalciferol)  50,000 Units Oral Q7 days    Social History   Tobacco Use   Smoking status: Every Day    Types: Cigarettes, Cigars   Smokeless tobacco: Never   Tobacco comments:    5 per day  Vaping Use   Vaping status: Never Used  Substance Use Topics   Alcohol use: Yes    Comment: occ   Drug use: No    History reviewed. No pertinent family history.   Review of Systems  Constitutional: +faitgue and +for fever, chills, diaphoresis, activity change, appetite change, fatigue and unexpected weight change.  HENT: Negative for congestion, sore  throat, rhinorrhea, sneezing, trouble swallowing and sinus pressure.  Eyes: Negative for photophobia and visual disturbance.  Respiratory: Negative for cough, chest tightness, shortness of breath, wheezing and stridor.  Cardiovascular: Negative for chest pain, palpitations and leg swelling.  Gastrointestinal: Negative for nausea, vomiting, abdominal pain, diarrhea, constipation, blood in stool, abdominal distention and anal bleeding.  Genitourinary: Negative for dysuria, hematuria, flank pain and difficulty urinating.  Musculoskeletal: Negative for myalgias, back pain, joint swelling, arthralgias and  gait problem.  Skin: Negative for color change, pallor, rash and wound.  Neurological: Negative for dizziness, tremors, weakness and light-headedness.  Hematological: Negative for adenopathy. Does not bruise/bleed easily.  Psychiatric/Behavioral: Negative for behavioral problems, confusion, sleep disturbance, dysphoric mood, decreased concentration and agitation.    OBJECTIVE: Temp:  [98.1 F (36.7 C)-99.8 F (37.7 C)] 98.1 F (36.7 C) (09/03 0800) Pulse Rate:  [77-113] 79 (09/03 1200) Resp:  [12-20] 14 (09/03 1400) BP: (105-153)/(52-78) 141/55 (09/03 1400) SpO2:  [94 %-99 %] 99 % (09/03 1200) Weight:  [86.7 kg] 86.7 kg (09/03 0500) Physical Exam  Constitutional: He is oriented to person, place, and time. He appears well-developed and well-nourished. No distress.  HENT:  Mouth/Throat: Oropharynx is clear and moist. No oropharyngeal exudate.  Cardiovascular: Normal rate, regular rhythm and normal heart sounds. Exam reveals no gallop and no friction rub.  No murmur heard.  Pulmonary/Chest: Effort normal and breath sounds normal. No respiratory distress. He has no wheezes.  Abdominal: Soft. Bowel sounds are normal. He exhibits no distension. There is no tenderness.  Lymphadenopathy:  He has no cervical adenopathy.  Neurological: He is alert and oriented to person, place, and time.  Skin: Skin is warm and dry. No rash noted. No erythema. Scattered abrasions and ecchymosis Psychiatric: He has a normal mood and affect. His behavior is normal.   LABS: Results for orders placed or performed during the hospital encounter of 10/13/22 (from the past 48 hour(s))  Magnesium     Status: None   Collection Time: 10/17/22  3:27 AM  Result Value Ref Range   Magnesium 2.1 1.7 - 2.4 mg/dL    Comment: Performed at [facility], [address] [place], [place] [address]  Phosphorus     Status: Abnormal   Collection Time: 10/17/22  3:27 AM  Result Value Ref Range   Phosphorus 4.7 (H) 2.5  - 4.6 mg/dL    Comment: Performed at [facility], [address] [place], [place] [address]  Basic metabolic panel     Status: Abnormal   Collection Time: 10/17/22  3:27 AM  Result Value Ref Range   Sodium 135 135 - 145 mmol/L   Potassium 4.2 3.5 - 5.1 mmol/L   Chloride 99 98 - 111 mmol/L   CO2 23 22 - 32 mmol/L   Glucose, Bld 121 (H) 70 - 99 mg/dL    Comment: Glucose reference range applies only to samples taken after fasting for at least 8 hours.   BUN 87 (H) 8 - 23 mg/dL   Creatinine, Ser 8.65 (H) 0.61 - 1.24 mg/dL   Calcium 8.3 (L) 8.9 - 10.3 mg/dL   GFR, Estimated 12 (L) >60 mL/min    Comment: (NOTE) Calculated using the CKD-EPI Creatinine Equation (2021)    Anion gap 13 5 - 15    Comment: Performed at [facility], [address] [place], [place] [address]  CBC with Differential/Platelet     Status: Abnormal   Collection Time: 10/17/22  3:27 AM  Result Value  Ref Range   WBC 37.0 (H) 4.0 - 10.5 K/uL   RBC 3.28 (L) 4.22 - 5.81 MIL/uL   Hemoglobin 9.9 (L) 13.0 - 17.0 g/dL   HCT 16.1 (L) 09.6 - 04.5 %   MCV 89.3 80.0 - 100.0 fL   MCH 30.2 26.0 - 34.0 pg   MCHC 33.8 30.0 - 36.0 g/dL   RDW 40.9 (H) 81.1 - 91.4 %   Platelets 190 150 - 400 K/uL   nRBC 0.4 (H) 0.0 - 0.2 %   Neutrophils Relative % 83 %   Neutro Abs 30.6 (H) 1.7 - 7.7 K/uL   Lymphocytes Relative 5 %   Lymphs Abs 1.8 0.7 - 4.0 K/uL   Monocytes Relative 10 %   Monocytes Absolute 3.5 (H) 0.1 - 1.0 K/uL   Eosinophils Relative 0 %   Eosinophils Absolute 0.1 0.0 - 0.5 K/uL   Basophils Relative 0 %   Basophils Absolute 0.1 0.0 - 0.1 K/uL   Immature Granulocytes 2 %   Abs Immature Granulocytes 0.80 (H) 0.00 - 0.07 K/uL    Comment: Performed at [facility], [address] [place], [place] [address]  CK     Status: Abnormal   Collection Time: 10/17/22  3:27 AM  Result Value Ref Range   Total CK 23 (L) 49 - 397 U/L    Comment: Performed at [facility], [address] [place], [place] [address]  Procalcitonin     Status: None   Collection Time: 10/17/22  3:27 AM  Result Value Ref Range   Procalcitonin 31.30 ng/mL    Comment:        Interpretation: PCT >= 10 ng/mL: Important systemic inflammatory response, almost exclusively due to severe bacterial sepsis or septic shock. (NOTE)       Sepsis PCT Algorithm           Lower Respiratory Tract                                      Infection PCT Algorithm    ----------------------------     ----------------------------         PCT < 0.25 ng/mL                PCT < 0.10 ng/mL          Strongly encourage             Strongly discourage   discontinuation of antibiotics    initiation of antibiotics    ----------------------------     -----------------------------       PCT 0.25 - 0.50 ng/mL            PCT 0.10 - 0.25 ng/mL               OR       >80% decrease in PCT            Discourage initiation of                                            antibiotics      Encourage discontinuation           of antibiotics    ----------------------------     -----------------------------         PCT >= 0.50  ng/mL              PCT 0.26 - 0.50 ng/mL                AND       <80% decrease in PCT             Encourage initiation of                                             antibiotics       Encourage continuation           of antibiotics    ----------------------------     -----------------------------        PCT >= 0.50 ng/mL                  PCT > 0.50 ng/mL               AND         increase in PCT                  Strongly encourage                                      initiation of antibiotics    Strongly encourage escalation           of antibiotics                                     -----------------------------                                           PCT <= 0.25 ng/mL                                                 OR                                        > 80% decrease in PCT                                       Discontinue / Do not initiate                                             antibiotics  Performed at [facility], [address] [place], [place] [address]   Sedimentation rate     Status: Abnormal   Collection Time: 10/17/22  3:27 AM  Result Value Ref Range   Sed Rate 92 (H) 0 - 20 mm/hr    Comment: Performed at [facility], [address] [place], [place] [address]  C-reactive protein  Status: Abnormal   Collection Time: 10/17/22  3:27 AM  Result Value Ref Range   CRP 31.4 (H) <1.0 mg/dL    Comment: Performed at [facility], [address], [place], [place] [address]  Glucose, capillary     Status: None   Collection Time: 10/17/22  7:15 AM  Result Value Ref Range   Glucose-Capillary 88 70 - 99 mg/dL    Comment: Glucose reference range applies only to samples taken after fasting for at least 8 hours.  Body fluid culture w Gram Stain     Status: None (Preliminary result)   Collection Time: 10/17/22  2:02 PM   Specimen: Synovium; Body Fluid  Result Value Ref Range   Specimen Description      SYNOVIAL Performed at [facility], [address] [place], [place] [address]    Special Requests      Normal Performed at [facility], [address] [place], [place] [address]    Gram Stain      FEW WBC PRESENT,BOTH PMN AND MONONUCLEAR NO ORGANISMS SEEN    Culture      NO GROWTH < 24 HOURS Performed at [facility], [address], [place], [place] [address]    Report Status PENDING   CBC with Differential/Platelet     Status: Abnormal   Collection Time: 10/18/22  5:03 AM  Result Value Ref Range   WBC 31.5 (H) 4.0 - 10.5 K/uL   RBC 3.25 (L) 4.22 - 5.81 MIL/uL   Hemoglobin 10.0 (L) 13.0 - 17.0 g/dL   HCT 29.5 (L) 62.1 - 30.8 %   MCV 89.8 80.0 - 100.0 fL   MCH 30.8 26.0 - 34.0 pg   MCHC 34.2 30.0 - 36.0 g/dL   RDW 65.7 (H) 84.6 - 96.2 %   Platelets 201 150 - 400 K/uL   nRBC 0.2 0.0 - 0.2 %   Neutrophils  Relative % 78 %   Neutro Abs 24.7 (H) 1.7 - 7.7 K/uL   Lymphocytes Relative 7 %   Lymphs Abs 2.1 0.7 - 4.0 K/uL   Monocytes Relative 12 %   Monocytes Absolute 3.6 (H) 0.1 - 1.0 K/uL   Eosinophils Relative 0 %   Eosinophils Absolute 0.1 0.0 - 0.5 K/uL   Basophils Relative 0 %   Basophils Absolute 0.1 0.0 - 0.1 K/uL   Immature Granulocytes 3 %   Abs Immature Granulocytes 0.90 (H) 0.00 - 0.07 K/uL    Comment: Performed at [facility], [address] [place], [place] [address]  Iron and TIBC     Status: Abnormal   Collection Time: 10/18/22  5:03 AM  Result Value Ref Range   Iron 19 (L) 45 - 182 ug/dL   TIBC 132 (L) 440 - 102 ug/dL   Saturation Ratios 13 (L) 17.9 - 39.5 %   UIBC 132 ug/dL    Comment: Performed at [facility], [address] [place], [place] [address]  Renal function panel     Status: Abnormal   Collection Time: 10/18/22  5:03 AM  Result Value Ref Range   Sodium 135 135 - 145 mmol/L   Potassium 4.6 3.5 - 5.1 mmol/L   Chloride 100 98 - 111 mmol/L   CO2 23 22 - 32 mmol/L   Glucose, Bld 115 (H) 70 - 99 mg/dL    Comment: Glucose reference range applies only to samples taken after fasting for at least 8 hours.   BUN 91 (  H) 8 - 23 mg/dL    Comment: RESULT CONFIRMED BY MANUAL DILUTION [facility]   Creatinine, Ser 4.71 (H) 0.61 - 1.24 mg/dL   Calcium 8.6 (L) 8.9 - 10.3 mg/dL   Phosphorus 5.6 (H) 2.5 - 4.6 mg/dL   Albumin 2.1 (L) 3.5 - 5.0 g/dL   GFR, Estimated 12 (L) >60 mL/min    Comment: (NOTE) Calculated using the CKD-EPI Creatinine Equation (2021)    Anion gap 12 5 - 15    Comment: Performed at [facility], [address] [place], [place] [address]  Glucose, capillary     Status: None   Collection Time: 10/18/22  8:02 AM  Result Value Ref Range   Glucose-Capillary 98 70 - 99 mg/dL    Comment: Glucose reference range applies only to samples taken after fasting for at least 8 hours.    MICRO: 8/30/ urine cx Culture >=100,000  COLONIES/mL KLEBSIELLA PNEUMONIAE Abnormal   Report Status 10/16/2022 FINAL  Organism ID, Bacteria KLEBSIELLA PNEUMONIAE Abnormal   Resulting Agency [facility]     Susceptibility   Klebsiella pneumoniae    MIC    AMPICILLIN >=32 RESIST... Resistant    AMPICILLIN/SULBACTAM 8 SENSITIVE Sensitive    CEFAZOLIN <=4 SENSITIVE Sensitive    CEFEPIME <=0.12 SENS... Sensitive    CEFTRIAXONE <=0.25 SENS... Sensitive    CIPROFLOXACIN <=0.25 SENS... Sensitive    GENTAMICIN <=1 SENSITIVE Sensitive    IMIPENEM <=0.25 SENS... Sensitive    NITROFURANTOIN 128 RESISTANT Resistant    PIP/TAZO <=4 SENSITIVE Sensitive    TRIMETH/SULFA <=20 SENSIT... Sensitive        IMAGING: No results found.   Assessment/Plan:  72yo M with history of frequent falls, fever, now found to have marked leukocytosis with bandemia, and aki. Being treated for UTI without improvement to FUO  - suspect that UTI is not the answer to his presentation - recommend to finish 7 day course, can switch to cephalexin tomorrow, renal adjusted - concern for malignancy, and agree with plan to look at peripheral blood smear and getting flow cytometry - concern that frequent falls isn't necessarily due to vertigo (for which meclizine) --but rather than CNS non-infectious involvement. Consider getting mri without contrast of brain - acute on chronic kidney disease= not sure how much ibuprofen he has been taking but still was higher dose than recommended for age and CKD. Appears improving, but still needs IVF. Could also be impacted if malignancy is part of the picture.  - will switch to oral abtx for next 2 days. And observe off of abtx  I have personally spent 80 minutes involved in face-to-face and non-face-to-face activities for this patient on the day of the visit. Professional time spent includes the following activities: Preparing to see the patient (review of tests), Obtaining and/or reviewing separately obtained history  (admission/discharge record), Performing a medically appropriate examination and/or evaluation , Ordering medications/tests/procedures, referring and communicating with other health care professionals, Documenting clinical information in the EMR, Independently interpreting results (not separately reported), Communicating results to the patient/family/caregiver, Counseling and educating the patient/family/caregiver and Care coordination (not separately reported).

## 2022-10-18 NOTE — Progress Notes (Signed)
Attempted to call 2A RN to give report, nurse did not answer.

## 2022-10-18 NOTE — Progress Notes (Signed)
[facility], [place] 10/18/22  Subjective:   Hospital day # 5  Remains critically ill.  Daughter at bedside. Received his first dialysis treatment on Saturday.  Total of 300 cc was removed. Urine output of 600->1125 cc from last 24 hours. Patient and his daughter report that overall he is more alert.    Continues to require [place] O2  Renal: 09/02 0701 - 09/03 0700 In: 960 [P.O.:960] Out: 1125 [Urine:1125] Lab Results  Component Value Date   CREATININE 4.71 (H) 10/18/2022   CREATININE 4.80 (H) 10/17/2022   CREATININE 4.32 (H) 10/16/2022     Objective:  Vital signs in last 24 hours:  Temp:  [98.1 F (36.7 C)-99.8 F (37.7 C)] 98.1 F (36.7 C) (09/03 0800) Pulse Rate:  [77-113] 79 (09/03 1200) Resp:  [12-20] 14 (09/03 1400) BP: (105-153)/(52-78) 141/55 (09/03 1400) SpO2:  [94 %-99 %] 99 % (09/03 1200) Weight:  [86.7 kg] 86.7 kg (09/03 0500)  Weight change: -3.3 kg Filed Weights   10/16/22 0300 10/17/22 0500 10/18/22 0500  Weight: 86.3 kg 90 kg 86.7 kg    Intake/Output:    Intake/Output Summary (Last 24 hours) at 10/18/2022 1453 Last data filed at 10/18/2022 0500 Gross per 24 hour  Intake 720 ml  Output 925 ml  Net -205 ml     Physical Exam: General: Critically ill-appearing  HEENT Moist oral mucous membranes  Pulm/lungs [place] O2  CVS/Heart Irregular  Abdomen:  Soft, mildly distended, nontender  Extremities: Dependent edema present  Neurologic: Alert, responds to simple commands  Skin: Warm  Access: Right IJ temp cath       Basic Metabolic Panel:  Recent Labs  Lab 10/14/22 0234 10/15/22 0129 10/16/22 0347 10/17/22 0327 10/18/22 0503  NA 144 140 138 135 135  K 3.4* 3.9 3.8 4.2 4.6  CL 106 104 101 99 100  CO2 18* 24 23 23 23   GLUCOSE 116* 131* 121* 121* 115*  BUN 77* 80* 67* 87* 91*  CREATININE 6.43* 6.18* 4.32* 4.80* 4.71*  CALCIUM 9.0 8.1* 8.2* 8.3* 8.6*  MG  --  1.8 1.8 2.1  --   PHOS  --  4.2 3.6 4.7* 5.6*      CBC: Recent Labs  Lab 10/13/22 1844 10/13/22 2102 10/14/22 0234 10/15/22 0127 10/16/22 0347 10/17/22 0327 10/18/22 0503  WBC 45.3* 27.8* 49.9* 44.9* 51.4* 37.0* 31.5*  NEUTROABS 39.3* 25.6*  --  37.8*  --  30.6* 24.7*  HGB 13.2 13.1 11.9* 11.0* 11.0* 9.9* 10.0*  HCT 40.1 40.1 34.3* 31.9* 30.7* 29.3* 29.2*  MCV 91.8 93.3 88.6 90.1 88.0 89.3 89.8  PLT 371 310 308 234 186 190 201      Lab Results  Component Value Date   HEPBSAG NON REACTIVE 10/15/2022   HEPBIGM NON REACTIVE 10/15/2022      Microbiology:  Recent Results (from the past 240 hour(s))  SARS Coronavirus 2 by RT PCR (hospital order, performed in [facility] hospital lab) *cepheid single result test* Anterior Nasal Swab     Status: None   Collection Time: 10/13/22  3:55 PM   Specimen: Anterior Nasal Swab  Result Value Ref Range Status   SARS Coronavirus 2 by RT PCR NEGATIVE NEGATIVE Final    Comment: (NOTE) SARS-CoV-2 target nucleic acids are NOT DETECTED.  The SARS-CoV-2 RNA is generally detectable in upper and lower respiratory specimens during the acute phase of infection. The lowest concentration of SARS-CoV-2 viral copies this assay can detect is 250 copies / mL.  A negative result does not preclude SARS-CoV-2 infection and should not be used as the sole basis for treatment or other patient management decisions.  A negative result may occur with improper specimen collection / handling, submission of specimen other than nasopharyngeal swab, presence of viral mutation(s) within the areas targeted by this assay, and inadequate number of viral copies (<250 copies / mL). A negative result must be combined with clinical observations, patient history, and epidemiological information.  Fact Sheet for Patients:   RoadLapTop.co.za  Fact Sheet for Healthcare Providers: http://kim-miller.com/  This test is not yet approved or  cleared by the [place] FDA  and has been authorized for detection and/or diagnosis of SARS-CoV-2 by FDA under an Emergency Use Authorization (EUA).  This EUA will remain in effect (meaning this test can be used) for the duration of the COVID-19 declaration under Section 564(b)(1) of the Act, 21 U.S.C. section 360bbb-3(b)(1), unless the authorization is terminated or revoked sooner.  Performed at [facility], [address] [place], [place] [address]   MRSA Next Gen by PCR, Nasal     Status: None   Collection Time: 10/13/22  9:51 PM   Specimen: Nasal Mucosa; Nasal Swab  Result Value Ref Range Status   MRSA by PCR Next Gen NOT DETECTED NOT DETECTED Final    Comment: (NOTE) The GeneXpert MRSA Assay (FDA approved for NASAL specimens only), is one component of a comprehensive MRSA colonization surveillance program. It is not intended to diagnose MRSA infection nor to guide or monitor treatment for MRSA infections. Test performance is not FDA approved in patients less than 87 years old. Performed at [facility], [address] [place], [place] [address]   Urine Culture     Status: Abnormal   Collection Time: 10/14/22  7:32 AM   Specimen: Urine, Random  Result Value Ref Range Status   Specimen Description   Final    URINE, RANDOM Performed at [facility], [address] [place], [place] [address]    Special Requests   Final    NONE Reflexed from [id number] Performed at [facility], [address] [place], [place] [address]    Culture >=100,000 COLONIES/mL KLEBSIELLA PNEUMONIAE (A)  Final   Report Status 10/16/2022 FINAL  Final   Organism ID, Bacteria KLEBSIELLA PNEUMONIAE (A)  Final      Susceptibility   Klebsiella pneumoniae - MIC*    AMPICILLIN >=32 RESISTANT Resistant     CEFAZOLIN <=4 SENSITIVE Sensitive     CEFEPIME <=0.12 SENSITIVE Sensitive     CEFTRIAXONE <=0.25 SENSITIVE Sensitive     CIPROFLOXACIN <=0.25 SENSITIVE Sensitive     GENTAMICIN <=1  SENSITIVE Sensitive     IMIPENEM <=0.25 SENSITIVE Sensitive     NITROFURANTOIN 128 RESISTANT Resistant     TRIMETH/SULFA <=20 SENSITIVE Sensitive     AMPICILLIN/SULBACTAM 8 SENSITIVE Sensitive     PIP/TAZO <=4 SENSITIVE Sensitive     * >=100,000 COLONIES/mL KLEBSIELLA PNEUMONIAE  Culture, blood (Routine X 2) w Reflex to ID Panel     Status: None (Preliminary result)   Collection Time: 10/15/22  1:29 AM   Specimen: Right Antecubital; Blood  Result Value Ref Range Status   Specimen Description RIGHT ANTECUBITAL  Final   Special Requests   Final    BOTTLES DRAWN AEROBIC AND ANAEROBIC Blood Culture adequate volume   Culture   Final    NO GROWTH 3 DAYS Performed at [facility], [address] [place], [place]  [address]    Report Status PENDING  Incomplete  Culture, blood (Routine X 2) w Reflex to ID Panel     Status: None (Preliminary result)   Collection Time: 10/15/22 11:41 AM   Specimen: BLOOD  Result Value Ref Range Status   Specimen Description BLOOD BLOOD RIGHT HAND  Final   Special Requests   Final    BOTTLES DRAWN AEROBIC ONLY Blood Culture results may not be optimal due to an inadequate volume of blood received in culture bottles   Culture   Final    NO GROWTH 3 DAYS Performed at [facility], [address] [place], [place] [address]    Report Status PENDING  Incomplete  Body fluid culture w Gram Stain     Status: None (Preliminary result)   Collection Time: 10/17/22  2:02 PM   Specimen: Synovium; Body Fluid  Result Value Ref Range Status   Specimen Description   Final    SYNOVIAL Performed at [facility], [address] [place], [place] [address]    Special Requests   Final    Normal Performed at [facility], [address] [place], [place] [address]    Gram Stain   Final    FEW WBC PRESENT,BOTH PMN AND MONONUCLEAR NO ORGANISMS SEEN    Culture   Final    NO GROWTH < 24 HOURS Performed at [facility],  [address] [place], [place] [address]    Report Status PENDING  Incomplete    Coagulation Studies: Recent Labs    10/16/22 1347  LABPROT 17.1*  INR 1.4*    Urinalysis: No results for input(s): "COLORURINE", "LABSPEC", "PHURINE", "GLUCOSEU", "HGBUR", "BILIRUBINUR", "KETONESUR", "PROTEINUR", "UROBILINOGEN", "NITRITE", "LEUKOCYTESUR" in the last 72 hours.  Invalid input(s): "APPERANCEUR"     Imaging: No results found.   Medications:    cefTRIAXone (ROCEPHIN)  IV 2 g (10/18/22 1425)    vitamin C  500 mg Oral BID   Chlorhexidine Gluconate Cloth  6 each Topical Daily   Chlorhexidine Gluconate Cloth  6 each Topical Q0600   cyanocobalamin  1,000 mcg Oral Daily   feeding supplement  237 mL Oral TID BM   folic acid  1 mg Oral Daily   heparin injection (subcutaneous)  5,000 Units Subcutaneous Q8H   multivitamin with minerals  1 tablet Oral Daily   nicotine  14 mg Transdermal Daily   thiamine  100 mg Oral Daily   Or   thiamine  100 mg Intravenous Daily   vancomycin variable dose per unstable renal function (pharmacist dosing)   Does not apply See admin instructions   Vitamin D (Ergocalciferol)  50,000 Units Oral Q7 days   acetaminophen, alteplase, chlorproMAZINE, heparin, melatonin, ondansetron **OR** ondansetron (ZOFRAN) IV, mouth rinse, polyethylene glycol, prochlorperazine, traMADol  Assessment/ Plan:  72 y.o. male with  tobacco use, alcohol abuse, osteoarthritis status post left knee replacement and hyperlipidemia, frequent falls over the past few weeks since July 4 th admitted on 10/13/2022 for AKI (acute kidney injury) (HCC) [N17.9]   Acute Kidney Injury on chronic kidney disease stage IIIB   Baseline creatinine of 1.74, GFR of 41 on 08/18/22 Suspect patient's acute kidney injury secondary to NSAID induced nephropathy and concurrent sepsis. No history of diabetes or hypertension per patient. Renal ultrasound with no obstruction. No IV contrast. -Underwent first  hemodialysis treatment on 10/15/2022.  Tolerated well.   -Electrolytes and volume status are acceptable.  Urine output has improved some.   No acute  indication for dialysis at present.  Spontaneous improvement in creatinine as well as urine output is noted. -Reevaluate for ongoing need of hemodialysis on a daily basis.  Sepsis Urinary source-urine culture from 10/14/2022 is growing Klebsiella. Currently being treated with iv Rocephin and Vancomycin   LOS: 5 Donel Osowski Thedore Mins 9/3/20242:53 PM  [facility], [place], [place] [phone]  Note: This note was prepared with Dragon dictation. Any transcription errors are unintentional

## 2022-10-18 NOTE — Progress Notes (Signed)
Progress Note   Patient: Joshua Mora MVH:[id number] DOB: Sep 08, 1950 DOA: 10/13/2022     5 DOS: the patient was seen and examined on 10/18/2022   Brief hospital course: Taken from prior notes.  Traivon Juneau is a 72 y.o. male with medical history significant of alcohol abuse, tobacco use disorder, diet controlled prediabetes, CKD 3B, osteoarthritis s/p left knee replacement who presented to the ED with complaints of progressive weakness and frequent falls over the past 1 month.     On presentation he was found to have severely elevated creatinine above baseline for which nephrology was consulted.  Also with significant leukocytosis above 46,000 with concern for leukemoid reaction in the setting of presumptive UTI.  Trauma imaging were nonacute.  CT head also nonacute.  Hospital course was complicated by acute hypoxic respiratory failure.  Was found to have mild pulmonary edema on chest x-ray.  Elevated D-dimer above 11 with concern for possible pulmonary embolism.  Heparin drip initiated.  9/1: Overnight became febrile at 100.7.  Urine cultures with Klebsiella pneumonia which are sensitive to ceftriaxone.  Had bilateral knee injuries which does not appear infected.  Worsening leukocytosis.  VQ scan negative for PE, lower extremity venous Doppler negative for DVT, discontinuing heparin infusion.  Adding vancomycin to broaden up the coverage.  Also concern of Leukomed reaction versus any underlying undiagnosed leukemia-hematology was consulted. Also ordered labs to rule out DIC due to worsening D-dimer.  9/2: Some improvement in leukocytosis, hematology ordered few more labs, if he continued to have persistently elevated white cell count then he might need bone marrow for further evaluation. Also consulted orthopedic surgery to get their opinion on this recent left nondisplaced fibular neck fracture, slight worsening of creatinine but good UOP.  Nephrology would like to wait before  proceeding for another dialysis.  9/3: Renal function seems stable.  Preliminary left knee aspirate with no growth.  Improving leukocytosis.  ID was also consulted.  Can transfer to progressive now.  Assessment and Plan: * Acute renal failure superimposed on stage 3b chronic kidney disease (HCC) Patient with poor p.o. intake, BUN of 72 and creatinine of 6.30 on admission, it was 1.74 early July when he was first seen with fall. Renal ultrasound negative for any obstruction, noted an ill-defined echogenic area in the right upper pole of kidney which can be followed up later on with designated CT scan. Patient was also using NSAID regularly. -Nephrology consult-patient was started on dialysis, currently further dialysis is on hold as nephrology will monitor closely day by day to determine further need at this time -Monitor renal function-currently seems stable -Avoid nephrotoxins  Severe sepsis [facility]) Patient met sepsis criteria with tachycardia, severe leukocytosis and severe sepsis with lactic acidosis above 4.3.  Initial procalcitonin was 60.67 which is decreasing. Did develop a low-grade fever overnight again.  Procalcitonin improving.  Preliminary blood cultures negative, urine cultures with Klebsiella pneumonia which shows only resistance to ampicillin and Augmentin.  Patient did received ceftriaxone for the past 3 days.  Vancomycin was added on 10/16/2022.  Left knee aspirate with no growth so far. -ID consult -Continue ceftriaxone -Continue vancomycin -Continue to monitor  Leukocytosis Seems improving slowly, initial smear review with bandemia.  Urine cultures with Klebsiella pneumonia but patient is being treated with appropriate antibiotics. Significantly elevated D-dimer which continued to get worse.  VTE has been ruled out with negative VQ scan and lower extremity venous Doppler. -Continue with ceftriaxone and vancomycin for now -Hematology consult-ordered some more labs,  likely  leukemoid reaction with severe inflammation and infection but if remain elevated he will need bone marrow biopsy for further evaluation  Elevated d-dimer Patient with worsening D-dimer, now at 13.  COVID was negative. DVT has been ruled out with negative VQ scan and lower extremity venous Doppler. -Stop heparin infusion -Checking labs for DIC -Continue to monitor  Acute hypoxemic respiratory failure (HCC) Patient did develop acute hypoxic respiratory failure and repeat chest x-ray at that time with concern of pulmonary congestion.  Echocardiogram with normal EF and grade 1 diastolic dysfunction.  VQ scan negative for PE Patient with significant AKI, also concern of NSAID induced renal injury. Started on dialysis. -Continue IV Lasix -Continue supplemental oxygen-wean as tolerated  Frequent falls Patient with history of recent decline, poor appetite and multiple falls.  First fall was on July 4 and since then he was falling couple of times a week.  Left knee imaging done on 8/23 with a concern of nondisplaced fibular neck fracture, Initial plan was outpatient follow-up with orthopedic surgery. Requested orthopedic surgery to evaluate while he is in hospital B12 normal, significantly low vitamin D-started on supplement. PT/OT are recommending SNF  Nicotine dependence, uncomplicated -Nicotine patch  Traumatic rhabdomyolysis (HCC) Likely secondary to recurrent falls.      Subjective: Patient was seen and examined today.  No new concern.  Per daughter he seems little more interactive and alert.  Physical Exam: Vitals:   10/18/22 0900 10/18/22 1000 10/18/22 1100 10/18/22 1200  BP: (!) 111/55 (!) 140/68 (!) 129/58 133/60  Pulse: 80 90 87 79  Resp: 13 17 16 14   Temp:      TempSrc:      SpO2: 96% 97% 95% 99%  Weight:      Height:       General.  Frail elderly man, in no acute distress. Pulmonary.  Lungs clear bilaterally, normal respiratory effort. CV.  Regular rate and rhythm,  no JVD, rub or murmur. Abdomen.  Soft, nontender, nondistended, BS positive. CNS.  Alert and oriented .  No focal neurologic deficit. Extremities.  No edema, no cyanosis, pulses intact and symmetrical. Psychiatry.  Judgment and insight appears normal.   Data Reviewed: Prior data reviewed  Family Communication: Discussed with daughter at bedside  Disposition: Status is: Inpatient Remains inpatient appropriate because: Severity of illness  Planned Discharge Destination: Skilled nursing facility  DVT prophylaxis.  Subcu heparin Time spent: 45 minutes  This record has been created using Conservation officer, historic buildings. Errors have been sought and corrected,but may not always be located. Such creation errors do not reflect on the standard of care.   Author: Arnetha Courser, MD 10/18/2022 12:37 PM  For on call review [email or web address].

## 2022-10-18 NOTE — Progress Notes (Signed)
Physical Therapy Treatment Patient Details Name: Joshua Mora MRN: [id number] DOB: 01/23/1951 Today's Date: 10/18/2022   History of Present Illness Pt is a 72 year old presenting to the ED with progressive weakness and frequent falls; admitted with AKI, leukocytosis  PMH significant for osteoarthritis s/p left knee replacement. Pt found to have L fibular head fx.    PT Comments  Pt mildly confused during the session but generally able to follow simple 1-step commands with extra time and cuing and put forth good effort during the session.  Pt required significant physical assistance throughout the session but ultimately was able to come to standing and take several very small, shuffling steps at the EOB.  Pt reported 10/10 L ankle pain at the onset of the session but no signs of distress were noted during WB activities.  Pt's SpO2 and HR both WNL on 5L during the session.  Pt will benefit from continued PT services upon discharge to safely address deficits listed in patient problem list for decreased caregiver assistance and eventual return to PLOF.     If plan is discharge home, recommend the following: Two people to help with walking and/or transfers;Two people to help with bathing/dressing/bathroom;Help with stairs or ramp for entrance;Assist for transportation   Can travel by private vehicle     No  Equipment Recommendations  Other (comment) (TBD at next venue of care)    Recommendations for Other Services       Precautions / Restrictions Precautions Precautions: Fall Restrictions Weight Bearing Restrictions: Yes LLE Weight Bearing: Weight bearing as tolerated Other Position/Activity Restrictions: No brace needed on LLE     Mobility  Bed Mobility Overal bed mobility: Needs Assistance Bed Mobility: Supine to Sit, Sit to Supine Rolling: Max assist, +2 for physical assistance   Supine to sit: Max assist, HOB elevated, Used rails, +2 for physical assistance Sit to supine:  Max assist, +2 for physical assistance   General bed mobility comments: +2 Max A for all bed mobility tasks but with pt putting forth clear effort to assist    Transfers Overall transfer level: Needs assistance Equipment used: Rolling walker (2 wheels) Transfers: Sit to/from Stand Sit to Stand: Mod assist, +2 physical assistance, From elevated surface           General transfer comment: Mod to max multi-modal cues for sequencing along with physical assist for foot and hand positioning    Ambulation/Gait Ambulation/Gait assistance: Mod assist, +2 safety/equipment Gait Distance (Feet): 1 Feet Assistive device: Rolling walker (2 wheels) Gait Pattern/deviations: Step-to pattern, Trunk flexed, Shuffle Gait velocity: decreased     General Gait Details: Pt able to effortfully shuffle feet laterally at the EOB but with shuffling pattern and poor stability   Stairs             Wheelchair Mobility     Tilt Bed    Modified Rankin (Stroke Patients Only)       Balance Overall balance assessment: Needs assistance Sitting-balance support: Feet supported, Bilateral upper extremity supported Sitting balance-Leahy Scale: Fair Sitting balance - Comments: Poor static sitting balance initially but progressed to fair Postural control: Posterior lean Standing balance support: Bilateral upper extremity supported, During functional activity Standing balance-Leahy Scale: Poor                              Cognition Arousal: Lethargic Behavior During Therapy: Flat affect Overall Cognitive Status: Impaired/Different from baseline  Exercises Other Exercises Other Exercises: Lateral and anterior weight shifting activities in sitting at EOB for improved core strength and sitting balance    General Comments        Pertinent Vitals/Pain Pain Assessment Pain Assessment: 0-10 Pain Score: 10-Worst pain  ever Pain Location: L ankle Pain Descriptors / Indicators: Sore, Aching Pain Intervention(s): Repositioned, Premedicated before session, Monitored during session    Home Living                          Prior Function            PT Goals (current goals can now be found in the care plan section) Progress towards PT goals: Progressing toward goals    Frequency    Min 1X/week      PT Plan      Co-evaluation              AM-PAC PT "6 Clicks" Mobility   Outcome Measure  Help needed turning from your back to your side while in a flat bed without using bedrails?: A Lot Help needed moving from lying on your back to sitting on the side of a flat bed without using bedrails?: Total Help needed moving to and from a bed to a chair (including a wheelchair)?: Total Help needed standing up from a chair using your arms (e.g., wheelchair or bedside chair)?: Total Help needed to walk in hospital room?: Total Help needed climbing 3-5 steps with a railing? : Total 6 Click Score: 7    End of Session Equipment Utilized During Treatment: Gait belt Activity Tolerance: Patient tolerated treatment well Patient left: in bed;with call bell/phone within reach;with family/visitor present Nurse Communication: Mobility status PT Visit Diagnosis: Muscle weakness (generalized) (M62.81);Repeated falls (R29.6);Difficulty in walking, not elsewhere classified (R26.2)     Time: 6962-9528 PT Time Calculation (min) (ACUTE ONLY): 27 min  Charges:    $Therapeutic Activity: 23-37 mins PT General Charges $$ ACUTE PT VISIT: 1 Visit                     D. Scott Tinita Brooker PT, DPT 10/18/22, 11:22 AM

## 2022-10-18 NOTE — Progress Notes (Signed)
Orthopedics progress note  Chief complaint left knee pain, rule out infection of TKA  Subjective, no acute events  Objective, chart reviewed  Aspiration results showed negative Gram stain, cultures pending Aspiration fluid appeared like typical synovial fluid  Examination is stable. Scab on the anterior knee.  No drainage.  No erythema.  There is no significant swelling.  His range of motion is decreased.  There are some mild pain around the knee.  Overall stable exam  Assessment and plan  72 year old male left knee pain consultation for rule out infected TKA.  Patient was admitted with sepsis and a white count of 46,000 and an ongoing source of the infection has not been identified.  Knee was aspirated yesterday.  It did not appear infectious.  Gram stain is negative thus far.  Cultures are pending.  Orthopedic recommendations: Weightbearing as tolerated Physical therapy as tolerated Mobilization as tolerated   Plan is nonsurgical at this time unless cultures would come back as positive.    Will follow peripherally at this time.  Please call back with any questions or concerns, or if the culture results come back positive.   I am off service today.  Please page orthopedics with any questions or concerns.   Thank you.   Nada Boozer, ortho Locums

## 2022-10-18 NOTE — Progress Notes (Signed)
Nutrition Follow-up  DOCUMENTATION CODES:   Not applicable  INTERVENTION:   -Liberalize diet to 2 gram sodium for wider variety of meal selections -Continue MVI with minerals daily -Continue Ensure Enlive po TID, each supplement provides 350 kcal and 20 grams of protein.   NUTRITION DIAGNOSIS:   Inadequate oral intake related to poor appetite as evidenced by per patient/family report.  Ongoing  GOAL:   Patient will meet greater than or equal to 90% of their needs  Progressing   MONITOR:   PO intake, Supplement acceptance, Labs, Weight trends, Skin, I & O's, Diet advancement  REASON FOR ASSESSMENT:   Malnutrition Screening Tool    ASSESSMENT:   72 y.o. male with medical history significant of osteoarthritis s/p left knee replacement who is admitted with weakness, frequent falls and AKI.  8/31- trialysis cath placed, first HD treatment  9/2- aspiration of lt knee by orthopedics to rule out infection; cultures pending  Reviewed I/O's: -165 ml x 24 hours and +4.8 L since admission  UOP: 1.1 L x 24 hours   Pt resting quietly at time of visit. Noted nursing preparing to transfer pt to progressive care unit.   Nephrology following for kidney function; will follow daily for HD needs, but no acute indications for HD today.   Pt with variable oral intake. Meal completions 0-100%. Pt currently on a renal diet. He has been drinking Ensure supplements.   Per TOC notes, awaiting family decision regarding SNF vs home with family.   Wt has been stable since admission.   Medications reviewed and include vitamin C, vitamin B-12, folic acid, thiamine, and vitamin D.   Labs reviewed: CBGS: 88-122. Phos: 5.6. K and Mg WDL.    Diet Order:   Diet Order             Diet renal with fluid restriction Room service appropriate? Yes; Fluid consistency: Thin  Diet effective now                   EDUCATION NEEDS:   Education needs have been addressed  Skin:  Skin  Assessment: Reviewed RN Assessment  Last BM:  10/13/22  Height:   Ht Readings from Last 1 Encounters:  10/14/22 5\' 9"  (1.753 m)    Weight:   Wt Readings from Last 1 Encounters:  10/18/22 86.7 kg    Ideal Body Weight:  72.7 kg  BMI:  Body mass index is 28.23 kg/m.  Estimated Nutritional Needs:   Kcal:  1900-2200kcal/day  Protein:  95-110g/day  Fluid:  1.9-2.2L/day    Levada Schilling, RD, LDN, CDCES Registered Dietitian II Certified Diabetes Care and Education Specialist Please refer to [facility] for RD and/or RD on-call/weekend/after hours pager

## 2022-10-18 NOTE — TOC Initial Note (Signed)
Transition of Care [facility]) - Initial/Assessment Note    Patient Details  Name: Joshua Mora MRN: [id number] Date of Birth: [birth year]  [facility]) CM/SW Contact:    Kreg Shropshire, RN Phone Number: 10/18/2022, 11:14 AM  Clinical Narrative:                 Daughter at bedside and cm spoke with pt. 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 Daughter stated she is going to see if he could come live with her whenever d/c.  Pt arrived from ED from:Home Caregiver Support: Daughter  DME at Home:[facility] Transportation: Daughter Previous Services: none HH/SNF Preference: None First Person of Contact: Daughter Synetta Fail PCP: Sandrea Hughs, NP  Cm informed pt that he has recommendations for SNF. He stated he has to think about it. Cm explained that he has the choice to chose if he can go to SNF. Cm will check back later today.  Cm will continue to follow for toc needs and d/c planning.     Barriers to Discharge: Continued Medical Work up   Patient Goals and CMS Choice   CMS Medicare.gov Compare Post Acute Care list provided to:: Patient Choice offered to / list presented to : Patient      Expected Discharge Plan and Services     Post Acute Care Choice: Home Health, Skilled Nursing Facility Living arrangements for the past 2 months: Single Family Home                                      Prior Living Arrangements/Services Living arrangements for the past 2 months: Single Family Home Lives with:: Self, Relatives          Need for Family Participation in Patient Care: Yes (Comment) Care giver support system in place?: Yes (comment) Current home services: DME    Activities of Daily Living Home Assistive Devices/Equipment: None ADL Screening (condition at time of admission) Patient's cognitive ability adequate to safely complete daily activities?: Yes Is the patient deaf or have difficulty hearing?: No Does the patient have difficulty seeing, even when  wearing glasses/contacts?: No Does the patient have difficulty concentrating, remembering, or making decisions?: No Patient able to express need for assistance with ADLs?: Yes Does the patient have difficulty dressing or bathing?: No Independently performs ADLs?: Yes (appropriate for developmental age) Does the patient have difficulty walking or climbing stairs?: Yes Weakness of Legs: Both Weakness of Arms/Hands: Both  Permission Sought/Granted                  Emotional Assessment Appearance:: Appears stated age     Orientation: : Oriented to Place, Oriented to  Time, Fluctuating Orientation (Suspected and/or reported Sundowners), Oriented to Self      Admission diagnosis:  AKI (acute kidney injury) (HCC) [N17.9] Patient Active Problem List   Diagnosis Date Noted   Acute hypoxemic respiratory failure (HCC) 10/16/2022   Elevated d-dimer 10/16/2022   Severe sepsis (HCC) 10/15/2022   Traumatic rhabdomyolysis (HCC) 10/15/2022   Acute renal failure superimposed on stage 3b chronic kidney disease (HCC) 10/13/2022   Leukocytosis 10/13/2022   Frequent falls 10/13/2022   History of colonic polyps    Polyp of colon    Chest pain 04/27/2015   Hyperlipidemia 07/23/2014   Nicotine dependence, uncomplicated 07/23/2014   Type 2 diabetes mellitus without complications (HCC) 07/23/2014   PCP:  Sandrea Hughs, NP Pharmacy:  [address] [facility] - [place], [place] - [address] [place] [address] Phone: [phone] Fax: [phone]     Social Determinants of Health (SDOH) Social History: SDOH Screenings   Food Insecurity: No Food Insecurity (10/13/2022)  Housing: Low Risk  (10/13/2022)  Transportation Needs: No Transportation Needs (10/13/2022)  Utilities: Not At Risk (10/13/2022)  Tobacco Use: High Risk (10/13/2022)   SDOH Interventions:     Readmission Risk Interventions     No data to display

## 2022-10-18 NOTE — Progress Notes (Signed)
OT Cancellation Note  Patient Details Name: Joshua Mora MRN: [id number] DOB: 1950/04/15   Cancelled Treatment:    Reason Eval/Treat Not Completed: Other (comment). Upon attempt, pt out of the room. Per unit secretary, pt has transferred to 2A. Will re-attempt OT tx at later date/time as pt is available.   Arman Filter., MPH, MS, OTR/L ascom [phone] 10/18/22, 4:18 PM

## 2022-10-19 DIAGNOSIS — A419 Sepsis, unspecified organism: Secondary | ICD-10-CM | POA: Diagnosis not present

## 2022-10-19 DIAGNOSIS — J9601 Acute respiratory failure with hypoxia: Secondary | ICD-10-CM | POA: Diagnosis not present

## 2022-10-19 DIAGNOSIS — N179 Acute kidney failure, unspecified: Secondary | ICD-10-CM | POA: Diagnosis not present

## 2022-10-19 DIAGNOSIS — D72825 Bandemia: Secondary | ICD-10-CM | POA: Diagnosis not present

## 2022-10-19 LAB — RENAL FUNCTION PANEL
Albumin: 2.2 g/dL — ABNORMAL LOW (ref 3.5–5.0)
Anion gap: 12 (ref 5–15)
BUN: 105 mg/dL — ABNORMAL HIGH (ref 8–23)
CO2: 22 mmol/L (ref 22–32)
Calcium: 8.8 mg/dL — ABNORMAL LOW (ref 8.9–10.3)
Chloride: 103 mmol/L (ref 98–111)
Creatinine, Ser: 4.47 mg/dL — ABNORMAL HIGH (ref 0.61–1.24)
GFR, Estimated: 13 mL/min — ABNORMAL LOW (ref >60)
Glucose, Bld: 113 mg/dL — ABNORMAL HIGH (ref 70–99)
Phosphorus: 5.6 mg/dL — ABNORMAL HIGH (ref 2.5–4.6)
Potassium: 4.7 mmol/L (ref 3.5–5.1)
Sodium: 137 mmol/L (ref 135–145)

## 2022-10-19 LAB — CBC WITH DIFFERENTIAL/PLATELET
Abs Immature Granulocytes: 1.18 10*3/uL — ABNORMAL HIGH (ref 0.00–0.07)
Basophils Absolute: 0.1 10*3/uL (ref 0.0–0.1)
Basophils Relative: 0 %
Eosinophils Absolute: 0.2 10*3/uL (ref 0.0–0.5)
Eosinophils Relative: 1 %
HCT: 30 % — ABNORMAL LOW (ref 39.0–52.0)
Hemoglobin: 10.3 g/dL — ABNORMAL LOW (ref 13.0–17.0)
Immature Granulocytes: 4 %
Lymphocytes Relative: 8 %
Lymphs Abs: 2.2 10*3/uL (ref 0.7–4.0)
MCH: 30.2 pg (ref 26.0–34.0)
MCHC: 34.3 g/dL (ref 30.0–36.0)
MCV: 88 fL (ref 80.0–100.0)
Monocytes Absolute: 3.7 10*3/uL — ABNORMAL HIGH (ref 0.1–1.0)
Monocytes Relative: 13 %
Neutro Abs: 21.7 10*3/uL — ABNORMAL HIGH (ref 1.7–7.7)
Neutrophils Relative %: 74 %
Platelets: 258 10*3/uL (ref 150–400)
RBC: 3.41 MIL/uL — ABNORMAL LOW (ref 4.22–5.81)
RDW: 16.1 % — ABNORMAL HIGH (ref 11.5–15.5)
WBC: 29 10*3/uL — ABNORMAL HIGH (ref 4.0–10.5)
nRBC: 0.1 % (ref 0.0–0.2)

## 2022-10-19 LAB — GLUCOSE, CAPILLARY
Glucose-Capillary: 101 mg/dL — ABNORMAL HIGH (ref 70–99)
Glucose-Capillary: 138 mg/dL — ABNORMAL HIGH (ref 70–99)
Glucose-Capillary: 121 mg/dL — ABNORMAL HIGH (ref 70–99)

## 2022-10-19 LAB — PROTEIN ELECTROPHORESIS, SERUM
A/G Ratio: 0.8 (ref 0.7–1.7)
Albumin ELP: 2.3 g/dL — ABNORMAL LOW (ref 2.9–4.4)
Alpha-1-Globulin: 0.4 g/dL (ref 0.0–0.4)
Alpha-2-Globulin: 1.1 g/dL — ABNORMAL HIGH (ref 0.4–1.0)
Beta Globulin: 0.8 g/dL (ref 0.7–1.3)
Gamma Globulin: 0.7 g/dL (ref 0.4–1.8)
Globulin, Total: 3 g/dL (ref 2.2–3.9)
Total Protein ELP: 5.3 g/dL — ABNORMAL LOW (ref 6.0–8.5)

## 2022-10-19 LAB — VANCOMYCIN, RANDOM: Vancomycin Rm: 11 ug/mL

## 2022-10-19 MED ORDER — CEPHALEXIN 500 MG PO CAPS
500.0000 mg | ORAL_CAPSULE | Freq: Two times a day (BID) | ORAL | Status: AC
  Administered 2022-10-19 – 2022-10-20 (×4): 500 mg via ORAL
  Filled 2022-10-19 (×4): qty 1

## 2022-10-19 NOTE — Progress Notes (Signed)
OT Cancellation Note  Patient Details Name: Joshua Mora MRN: [id number] DOB: 22-Oct-1950   Cancelled Treatment:    Reason Eval/Treat Not Completed: Pain limiting ability to participate;Fatigue/lethargy limiting ability to participate (Pt recieved with family in room, refusing to participate in any therapies today, "I'll do it tomorrow". Pt endorses 10/10 pain. Despite family and therapist encourgement, pt still declines. Pt and family educated on benefits of repositioning for pressure relief and importance of OOB activity for healing. RN notified of pt's pain levels. OT provided ginger ale (approved by RN) to pt. Will check back as able to re-attempt .  Shaunita Seney L. Cartez Mogle, OTR/L  10/19/22, 10:16 AM

## 2022-10-19 NOTE — Progress Notes (Signed)
[facility], [place] 10/19/22  Subjective:   Hospital day # 6  Patient seen laying in bed, no family present Has pulled IV out, states it was an accident Tolerating meals without nausea or vomiting   Renal: 09/03 0701 - 09/04 0700 In: 200 [P.O.:200] Out: 100 [Urine:100] Lab Results  Component Value Date   CREATININE 4.47 (H) 10/19/2022   CREATININE 4.71 (H) 10/18/2022   CREATININE 4.80 (H) 10/17/2022     Objective:  Vital signs in last 24 hours:  Temp:  [97.4 F (36.3 C)-98.9 F (37.2 C)] 97.4 F (36.3 C) (09/04 1233) Pulse Rate:  [82-100] 90 (09/04 1233) Resp:  [14-18] 16 (09/04 1233) BP: (116-142)/(54-64) 116/56 (09/04 1233) SpO2:  [93 %-100 %] 97 % (09/04 1233)  Weight change:  Filed Weights   10/16/22 0300 10/17/22 0500 10/18/22 0500  Weight: 86.3 kg 90 kg 86.7 kg    Intake/Output:    Intake/Output Summary (Last 24 hours) at 10/19/2022 1516 Last data filed at 10/19/2022 0813 Gross per 24 hour  Intake 200 ml  Output 400 ml  Net -200 ml     Physical Exam: General: Critically ill-appearing  HEENT Moist oral mucous membranes  Pulm/lungs [place] O2  CVS/Heart Irregular  Abdomen:  Soft, mildly distended, nontender  Extremities: Dependent edema present  Neurologic: Alert, responds to simple commands  Skin: Warm  Access: Right IJ temp cath       Basic Metabolic Panel:  Recent Labs  Lab 10/15/22 0129 10/16/22 0347 10/17/22 0327 10/18/22 0503 10/19/22 0605  NA 140 138 135 135 137  K 3.9 3.8 4.2 4.6 4.7  CL 104 101 99 100 103  CO2 24 23 23 23 22   GLUCOSE 131* 121* 121* 115* 113*  BUN 80* 67* 87* 91* 105*  CREATININE 6.18* 4.32* 4.80* 4.71* 4.47*  CALCIUM 8.1* 8.2* 8.3* 8.6* 8.8*  MG 1.8 1.8 2.1  --   --   PHOS 4.2 3.6 4.7* 5.6* 5.6*     CBC: Recent Labs  Lab 10/13/22 2102 10/14/22 0234 10/15/22 0127 10/16/22 0347 10/17/22 0327 10/18/22 0503 10/19/22 0605  WBC 27.8*   < > 44.9* 51.4* 37.0* 31.5* 29.0*   NEUTROABS 25.6*  --  37.8*  --  30.6* 24.7* 21.7*  HGB 13.1   < > 11.0* 11.0* 9.9* 10.0* 10.3*  HCT 40.1   < > 31.9* 30.7* 29.3* 29.2* 30.0*  MCV 93.3   < > 90.1 88.0 89.3 89.8 88.0  PLT 310   < > 234 186 190 201 258   < > = values in this interval not displayed.      Lab Results  Component Value Date   HEPBSAG NON REACTIVE 10/15/2022   HEPBIGM NON REACTIVE 10/15/2022      Microbiology:  Recent Results (from the past 240 hour(s))  SARS Coronavirus 2 by RT PCR (hospital order, performed in [facility] hospital lab) *cepheid single result test* Anterior Nasal Swab     Status: None   Collection Time: 10/13/22  3:55 PM   Specimen: Anterior Nasal Swab  Result Value Ref Range Status   SARS Coronavirus 2 by RT PCR NEGATIVE NEGATIVE Final    Comment: (NOTE) SARS-CoV-2 target nucleic acids are NOT DETECTED.  The SARS-CoV-2 RNA is generally detectable in upper and lower respiratory specimens during the acute phase of infection. The lowest concentration of SARS-CoV-2 viral copies this assay can detect is 250 copies / mL. A negative result does not preclude SARS-CoV-2 infection and should  not be used as the sole basis for treatment or other patient management decisions.  A negative result may occur with improper specimen collection / handling, submission of specimen other than nasopharyngeal swab, presence of viral mutation(s) within the areas targeted by this assay, and inadequate number of viral copies (<250 copies / mL). A negative result must be combined with clinical observations, patient history, and epidemiological information.  Fact Sheet for Patients:   RoadLapTop.co.za  Fact Sheet for Healthcare Providers: http://kim-miller.com/  This test is not yet approved or  cleared by the [place] FDA and has been authorized for detection and/or diagnosis of SARS-CoV-2 by FDA under an Emergency Use Authorization (EUA).  This EUA  will remain in effect (meaning this test can be used) for the duration of the COVID-19 declaration under Section 564(b)(1) of the Act, 21 U.S.C. section 360bbb-3(b)(1), unless the authorization is terminated or revoked sooner.  Performed at [facility], [address] [place], [place] [address]   MRSA Next Gen by PCR, Nasal     Status: None   Collection Time: 10/13/22  9:51 PM   Specimen: Nasal Mucosa; Nasal Swab  Result Value Ref Range Status   MRSA by PCR Next Gen NOT DETECTED NOT DETECTED Final    Comment: (NOTE) The GeneXpert MRSA Assay (FDA approved for NASAL specimens only), is one component of a comprehensive MRSA colonization surveillance program. It is not intended to diagnose MRSA infection nor to guide or monitor treatment for MRSA infections. Test performance is not FDA approved in patients less than 47 years old. Performed at [facility], [address] [place], [place] [address]   Urine Culture     Status: Abnormal   Collection Time: 10/14/22  7:32 AM   Specimen: Urine, Random  Result Value Ref Range Status   Specimen Description   Final    URINE, RANDOM Performed at [facility], [address] [place], [place] [address]    Special Requests   Final    NONE Reflexed from [phone] Performed at [facility], [address] [place], [place] [address]    Culture >=100,000 COLONIES/mL KLEBSIELLA PNEUMONIAE (A)  Final   Report Status 10/16/2022 FINAL  Final   Organism ID, Bacteria KLEBSIELLA PNEUMONIAE (A)  Final      Susceptibility   Klebsiella pneumoniae - MIC*    AMPICILLIN >=32 RESISTANT Resistant     CEFAZOLIN <=4 SENSITIVE Sensitive     CEFEPIME <=0.12 SENSITIVE Sensitive     CEFTRIAXONE <=0.25 SENSITIVE Sensitive     CIPROFLOXACIN <=0.25 SENSITIVE Sensitive     GENTAMICIN <=1 SENSITIVE Sensitive     IMIPENEM <=0.25 SENSITIVE Sensitive     NITROFURANTOIN 128 RESISTANT Resistant     TRIMETH/SULFA <=20 SENSITIVE  Sensitive     AMPICILLIN/SULBACTAM 8 SENSITIVE Sensitive     PIP/TAZO <=4 SENSITIVE Sensitive     * >=100,000 COLONIES/mL KLEBSIELLA PNEUMONIAE  Culture, blood (Routine X 2) w Reflex to ID Panel     Status: None (Preliminary result)   Collection Time: 10/15/22  1:29 AM   Specimen: Right Antecubital; Blood  Result Value Ref Range Status   Specimen Description RIGHT ANTECUBITAL  Final   Special Requests   Final    BOTTLES DRAWN AEROBIC AND ANAEROBIC Blood Culture adequate volume   Culture   Final    NO GROWTH 4 DAYS Performed at [facility], [address] [place], [place] [address]    Report Status PENDING  Incomplete  Culture, blood (Routine X 2) w Reflex to ID Panel     Status: None (Preliminary result)   Collection Time: 10/15/22 11:41 AM   Specimen: BLOOD  Result Value Ref Range Status   Specimen Description BLOOD BLOOD RIGHT HAND  Final   Special Requests   Final    BOTTLES DRAWN AEROBIC ONLY Blood Culture results may not be optimal due to an inadequate volume of blood received in culture bottles   Culture   Final    NO GROWTH 4 DAYS Performed at [facility], [address] [place], [place] [address]    Report Status PENDING  Incomplete  Body fluid culture w Gram Stain     Status: None (Preliminary result)   Collection Time: 10/17/22  2:02 PM   Specimen: Synovium; Body Fluid  Result Value Ref Range Status   Specimen Description   Final    SYNOVIAL Performed at [facility], [address] [place], [place] [address]    Special Requests   Final    Normal Performed at [facility], [address] [place], [place] [address]    Gram Stain   Final    FEW WBC PRESENT,BOTH PMN AND MONONUCLEAR NO ORGANISMS SEEN    Culture   Final    NO GROWTH 2 DAYS Performed at [facility], [address], [place], [place] [address]    Report Status PENDING  Incomplete    Coagulation Studies: No results for input(s): "LABPROT", "INR"  in the last 72 hours.   Urinalysis: No results for input(s): "COLORURINE", "LABSPEC", "PHURINE", "GLUCOSEU", "HGBUR", "BILIRUBINUR", "KETONESUR", "PROTEINUR", "UROBILINOGEN", "NITRITE", "LEUKOCYTESUR" in the last 72 hours.  Invalid input(s): "APPERANCEUR"     Imaging: No results found.   Medications:      vitamin C  500 mg Oral BID   cephALEXin  500 mg Oral Q12H   Chlorhexidine Gluconate Cloth  6 each Topical Daily   Chlorhexidine Gluconate Cloth  6 each Topical Q0600   cyanocobalamin  1,000 mcg Oral Daily   feeding supplement  237 mL Oral TID BM   folic acid  1 mg Oral Daily   heparin injection (subcutaneous)  5,000 Units Subcutaneous Q8H   multivitamin with minerals  1 tablet Oral Daily   nicotine  14 mg Transdermal Daily   thiamine  100 mg Oral Daily   Or   thiamine  100 mg Intravenous Daily   Vitamin D (Ergocalciferol)  50,000 Units Oral Q7 days   acetaminophen, alteplase, chlorproMAZINE, heparin, melatonin, ondansetron **OR** ondansetron (ZOFRAN) IV, mouth rinse, polyethylene glycol, prochlorperazine, traMADol  Assessment/ Plan:  72 y.o. male with  tobacco use, alcohol abuse, osteoarthritis status post left knee replacement and hyperlipidemia, frequent falls over the past few weeks since July 4 th admitted on 10/13/2022 for AKI (acute kidney injury) (HCC) [N17.9]   Acute Kidney Injury on chronic kidney disease stage IIIB   Baseline creatinine of 1.74, GFR of 41 on 08/18/22 Suspect patient's acute kidney injury secondary to NSAID induced nephropathy and concurrent sepsis. No history of diabetes or hypertension per patient. Renal ultrasound with no obstruction. No IV contrast. -Underwent first hemodialysis treatment on 10/15/2022.   -Creatinine has shown improvement. BUN remains elevated. - Urine output appears to have decreased - Will monitor labs in am and determine if dialysis is needed.  Sepsis Urinary source-urine culture from 10/14/2022 is growing  Klebsiella. Completed iv Rocephin and Vancomycin   LOS: 6   9/4/20243:16 PM  [facility]  [address] [place], [place] [phone]

## 2022-10-19 NOTE — Progress Notes (Signed)
Progress Note   Patient: Joshua Mora QIH:[id number] DOB: February 01, 1951 DOA: 10/13/2022     6 DOS: the patient was seen and examined on 10/19/2022   Brief hospital course: Taken from prior notes.  Demarr Kangas is a 72 y.o. male with medical history significant of alcohol abuse, tobacco use disorder, diet controlled prediabetes, CKD 3B, osteoarthritis s/p left knee replacement who presented to the ED with complaints of progressive weakness and frequent falls over the past 1 month.     On presentation he was found to have severely elevated creatinine above baseline for which nephrology was consulted.  Also with significant leukocytosis above 46,000 with concern for leukemoid reaction in the setting of presumptive UTI.  Trauma imaging were nonacute.  CT head also nonacute.  Hospital course was complicated by acute hypoxic respiratory failure.  Was found to have mild pulmonary edema on chest x-ray.  Elevated D-dimer above 11 with concern for possible pulmonary embolism.  Heparin drip initiated.  9/1: Overnight became febrile at 100.7.  Urine cultures with Klebsiella pneumonia which are sensitive to ceftriaxone.  Had bilateral knee injuries which does not appear infected.  Worsening leukocytosis.  VQ scan negative for PE, lower extremity venous Doppler negative for DVT, discontinuing heparin infusion.  Adding vancomycin to broaden up the coverage.  Also concern of Leukomed reaction versus any underlying undiagnosed leukemia-hematology was consulted. Also ordered labs to rule out DIC due to worsening D-dimer.  9/2: Some improvement in leukocytosis, hematology ordered few more labs, if he continued to have persistently elevated white cell count then he might need bone marrow for further evaluation. Also consulted orthopedic surgery to get their opinion on this recent left nondisplaced fibular neck fracture, slight worsening of creatinine but good UOP.  Nephrology would like to wait before  proceeding for another dialysis.  9/3: Renal function seems stable.  Preliminary left knee aspirate with no growth.  Improving leukocytosis.  ID was also consulted.  Can transfer to progressive now.  9/4: Slowly improving creatinine but patient remained elevated.  Nephrology to decide again tomorrow if he need another dialysis.  Improving leukocytosis, elevated kappa and lambda with normal ratio, rest of the labs pending.  ID is now on board and likely switch to p.o. antibiotics soon  Assessment and Plan: * Acute renal failure superimposed on stage 3b chronic kidney disease (HCC) Patient with poor p.o. intake, BUN of 72 and creatinine of 6.30 on admission, it was 1.74 early July when he was first seen with fall. Renal ultrasound negative for any obstruction, noted an ill-defined echogenic area in the right upper pole of kidney which can be followed up later on with designated CT scan. Patient was also using NSAID regularly. -Nephrology consult-patient was started on dialysis, currently further dialysis is on hold as nephrology will monitor closely day by day to determine further need at this time -Monitor renal function-currently seems stable -Avoid nephrotoxins  Severe sepsis [facility]) Patient met sepsis criteria with tachycardia, severe leukocytosis and severe sepsis with lactic acidosis above 4.3.  Initial procalcitonin was 60.67 which is decreasing. Did develop a low-grade fever overnight again.  Procalcitonin improving.  Preliminary blood cultures negative, urine cultures with Klebsiella pneumonia which shows only resistance to ampicillin and Augmentin.  Patient did received ceftriaxone for the past 3 days.  Vancomycin was added on 10/16/2022.  Left knee aspirate with no growth so far. -ID consult -Continue ceftriaxone -Continue vancomycin -Continue to monitor  Leukocytosis Seems improving slowly, initial smear review with bandemia.  Urine cultures with Klebsiella pneumonia but patient is  being treated with appropriate antibiotics. Significantly elevated D-dimer which continued to get worse.  VTE has been ruled out with negative VQ scan and lower extremity venous Doppler. -Continue with ceftriaxone and vancomycin for now -Hematology consult-ordered some more labs, likely leukemoid reaction with severe inflammation and infection but if remain elevated he will need bone marrow biopsy for further evaluation  Elevated d-dimer Patient with worsening D-dimer, now at 13.  COVID was negative. DVT has been ruled out with negative VQ scan and lower extremity venous Doppler. -Stop heparin infusion -Checking labs for DIC -Continue to monitor  Acute hypoxemic respiratory failure (HCC) Patient did develop acute hypoxic respiratory failure and repeat chest x-ray at that time with concern of pulmonary congestion.  Echocardiogram with normal EF and grade 1 diastolic dysfunction.  VQ scan negative for PE Patient with significant AKI, also concern of NSAID induced renal injury. Started on dialysis. -Continue IV Lasix -Continue supplemental oxygen-wean as tolerated  Frequent falls Patient with history of recent decline, poor appetite and multiple falls.  First fall was on July 4 and since then he was falling couple of times a week.  Left knee imaging done on 8/23 with a concern of nondisplaced fibular neck fracture, Initial plan was outpatient follow-up with orthopedic surgery. Requested orthopedic surgery to evaluate while he is in hospital B12 normal, significantly low vitamin D-started on supplement. PT/OT are recommending SNF  Nicotine dependence, uncomplicated -Nicotine patch  Traumatic rhabdomyolysis (HCC) Likely secondary to recurrent falls.      Subjective: Patient was seen and examined today.  Having mild cough.  No other new concern.  Physical Exam: Vitals:   10/19/22 1018 10/19/22 1103 10/19/22 1233 10/19/22 1601  BP:   (!) 116/56 134/75  Pulse:   90 86  Resp: 18 18  16 16   Temp:   (!) 97.4 F (36.3 C) 97.9 F (36.6 C)  TempSrc:   Oral Oral  SpO2:   97% 94%  Weight:      Height:       General.  Frail elderly man, in no acute distress. Pulmonary.  Lungs clear bilaterally, normal respiratory effort. CV.  Regular rate and rhythm, no JVD, rub or murmur. Abdomen.  Soft, nontender, nondistended, BS positive. CNS.  Alert and oriented .  No focal neurologic deficit. Extremities.  No edema, no cyanosis, pulses intact and symmetrical. Psychiatry.  Judgment and insight appears normal.    Data Reviewed: Prior data reviewed  Family Communication: Discussed with daughter at bedside  Disposition: Status is: Inpatient Remains inpatient appropriate because: Severity of illness  Planned Discharge Destination: Skilled nursing facility  DVT prophylaxis.  Subcu heparin Time spent: 44 minutes  This record has been created using Conservation officer, historic buildings. Errors have been sought and corrected,but may not always be located. Such creation errors do not reflect on the standard of care.   Author: Arnetha Courser, MD 10/19/2022 4:06 PM  For on call review [email or web address].

## 2022-10-19 NOTE — Care Management Important Message (Signed)
Important Message  Patient Details  Name: Joshua Mora MRN: [id number] Date of Birth: Feb 08, 1951   Medicare Important Message Given:  Yes     Joshua Mora 10/19/2022, 10:44 AM

## 2022-10-19 NOTE — Progress Notes (Signed)
PT Cancellation Note  Patient Details Name: Joshua Mora MRN: [id number] DOB: 1951/01/16   Cancelled Treatment:    Reason Eval/Treat Not Completed: Patient declined to participate with PT services despite encouragement and education on physiological benefits of activity. Will attempt to see pt at a future date/time as medically appropriate.     Ovidio Hanger PT, DPT 10/19/22, 4:36 PM

## 2022-10-19 NOTE — TOC Progression Note (Signed)
Transition of Care [facility]) - Progression Note    Patient Details  Name: Joshua Mora MRN: [id number] Date of Birth: 1950/03/16  Transition of Care [facility]) CM/SW Contact  Truddie Hidden, RN Phone Number: 10/19/2022, 2:19 PM  Clinical Narrative:    Spoke with patient and family at bedside. They are agreeable to SNF. They would like [facility] as first choice and [facility] as second. They would not like [facility]. Patient daughter advised [facility] would still be required about bed offer obtained.      Barriers to Discharge: Continued Medical Work up  Expected Discharge Plan and Services     Post Acute Care Choice: Home Health, Skilled Nursing Facility Living arrangements for the past 2 months: Single Family Home                                       Social Determinants of Health (SDOH) Interventions SDOH Screenings   Food Insecurity: No Food Insecurity (10/13/2022)  Housing: Low Risk  (10/13/2022)  Transportation Needs: No Transportation Needs (10/13/2022)  Utilities: Not At Risk (10/13/2022)  Tobacco Use: High Risk (10/13/2022)    Readmission Risk Interventions     No data to display

## 2022-10-20 DIAGNOSIS — N179 Acute kidney failure, unspecified: Secondary | ICD-10-CM | POA: Diagnosis not present

## 2022-10-20 DIAGNOSIS — D72825 Bandemia: Secondary | ICD-10-CM | POA: Diagnosis not present

## 2022-10-20 DIAGNOSIS — A419 Sepsis, unspecified organism: Secondary | ICD-10-CM | POA: Diagnosis not present

## 2022-10-20 DIAGNOSIS — J9601 Acute respiratory failure with hypoxia: Secondary | ICD-10-CM | POA: Diagnosis not present

## 2022-10-20 LAB — CBC WITH DIFFERENTIAL/PLATELET
Abs Immature Granulocytes: 0.98 10*3/uL — ABNORMAL HIGH (ref 0.00–0.07)
Basophils Absolute: 0.1 10*3/uL (ref 0.0–0.1)
Basophils Relative: 0 %
Eosinophils Absolute: 0.2 10*3/uL (ref 0.0–0.5)
Eosinophils Relative: 0 %
HCT: 30.8 % — ABNORMAL LOW (ref 39.0–52.0)
Hemoglobin: 10.9 g/dL — ABNORMAL LOW (ref 13.0–17.0)
Immature Granulocytes: 3 %
Lymphocytes Relative: 6 %
Lymphs Abs: 2 10*3/uL (ref 0.7–4.0)
MCH: 30.6 pg (ref 26.0–34.0)
MCHC: 35.4 g/dL (ref 30.0–36.0)
MCV: 86.5 fL (ref 80.0–100.0)
Monocytes Absolute: 3.4 10*3/uL — ABNORMAL HIGH (ref 0.1–1.0)
Monocytes Relative: 10 %
Neutro Abs: 26.9 10*3/uL — ABNORMAL HIGH (ref 1.7–7.7)
Neutrophils Relative %: 81 %
Platelets: 349 10*3/uL (ref 150–400)
RBC: 3.56 MIL/uL — ABNORMAL LOW (ref 4.22–5.81)
RDW: 16.3 % — ABNORMAL HIGH (ref 11.5–15.5)
Smear Review: NORMAL
WBC: 33.6 10*3/uL — ABNORMAL HIGH (ref 4.0–10.5)
nRBC: 0 % (ref 0.0–0.2)

## 2022-10-20 LAB — CULTURE, BLOOD (ROUTINE X 2)
Culture: NO GROWTH
Culture: NO GROWTH
Special Requests: ADEQUATE

## 2022-10-20 LAB — RENAL FUNCTION PANEL
Albumin: 2.1 g/dL — ABNORMAL LOW (ref 3.5–5.0)
Anion gap: 13 (ref 5–15)
BUN: 103 mg/dL — ABNORMAL HIGH (ref 8–23)
CO2: 21 mmol/L — ABNORMAL LOW (ref 22–32)
Calcium: 8.8 mg/dL — ABNORMAL LOW (ref 8.9–10.3)
Chloride: 103 mmol/L (ref 98–111)
Creatinine, Ser: 4.5 mg/dL — ABNORMAL HIGH (ref 0.61–1.24)
GFR, Estimated: 13 mL/min — ABNORMAL LOW (ref >60)
Glucose, Bld: 118 mg/dL — ABNORMAL HIGH (ref 70–99)
Phosphorus: 5.9 mg/dL — ABNORMAL HIGH (ref 2.5–4.6)
Potassium: 5.1 mmol/L (ref 3.5–5.1)
Sodium: 137 mmol/L (ref 135–145)

## 2022-10-20 LAB — COMP PANEL: LEUKEMIA/LYMPHOMA

## 2022-10-20 LAB — GLUCOSE, CAPILLARY: Glucose-Capillary: 88 mg/dL (ref 70–99)

## 2022-10-20 LAB — BCR-ABL1 FISH
Cells Analyzed: 200
Cells Counted: 200

## 2022-10-20 LAB — PATHOLOGIST SMEAR REVIEW

## 2022-10-20 MED ORDER — TRAZODONE HCL 50 MG PO TABS
25.0000 mg | ORAL_TABLET | Freq: Every evening | ORAL | Status: DC | PRN
  Administered 2022-10-20 – 2022-11-03 (×10): 25 mg via ORAL
  Filled 2022-10-20 (×10): qty 1

## 2022-10-20 MED ORDER — MORPHINE SULFATE (PF) 2 MG/ML IV SOLN
2.0000 mg | INTRAVENOUS | Status: DC | PRN
  Administered 2022-10-20 – 2022-10-27 (×21): 2 mg via INTRAVENOUS
  Filled 2022-10-20 (×22): qty 1

## 2022-10-20 NOTE — Progress Notes (Signed)
[facility] [place], [place] 10/20/22  Subjective:   Hospital day # 7  Sitting up in bed Daughter at bedside Daughter states confusion was worse overnight, patient did not sleep with medications given Explains drowsiness this morning.    Renal: 09/04 0701 - 09/05 0700 In: 0  Out: 1530 [Urine:1530] Lab Results  Component Value Date   CREATININE 4.50 (H) 10/20/2022   CREATININE 4.47 (H) 10/19/2022   CREATININE 4.71 (H) 10/18/2022     Objective:  Vital signs in last 24 hours:  Temp:  [97.5 F (36.4 C)-98.1 F (36.7 C)] 97.6 F (36.4 C) (09/05 1143) Pulse Rate:  [86-100] 88 (09/05 1143) Resp:  [16-20] 18 (09/05 1143) BP: (105-134)/(48-75) 123/59 (09/05 1143) SpO2:  [91 %-97 %] 97 % (09/05 1143) Weight:  [88.1 kg] 88.1 kg (09/05 0414)  Weight change:  Filed Weights   10/17/22 0500 10/18/22 0500 10/20/22 0414  Weight: 90 kg 86.7 kg 88.1 kg    Intake/Output:    Intake/Output Summary (Last 24 hours) at 10/20/2022 1434 Last data filed at 10/20/2022 1041 Gross per 24 hour  Intake 0 ml  Output 1480 ml  Net -1480 ml     Physical Exam: General: Critically ill-appearing  HEENT Moist oral mucous membranes  Pulm/lungs [place] O2  CVS/Heart Irregular  Abdomen:  Soft, mildly distended, nontender  Extremities: Dependent edema present  Neurologic: Alert, responds to simple commands  Skin: Warm  Access: Right IJ temp cath       Basic Metabolic Panel:  Recent Labs  Lab 10/15/22 0129 10/16/22 0347 10/17/22 0327 10/18/22 0503 10/19/22 0605 10/20/22 0427  NA 140 138 135 135 137 137  K 3.9 3.8 4.2 4.6 4.7 5.1  CL 104 101 99 100 103 103  CO2 24 23 23 23 22  21*  GLUCOSE 131* 121* 121* 115* 113* 118*  BUN 80* 67* 87* 91* 105* 103*  CREATININE 6.18* 4.32* 4.80* 4.71* 4.47* 4.50*  CALCIUM 8.1* 8.2* 8.3* 8.6* 8.8* 8.8*  MG 1.8 1.8 2.1  --   --   --   PHOS 4.2 3.6 4.7* 5.6* 5.6* 5.9*     CBC: Recent Labs  Lab 10/15/22 0127 10/16/22 0347  10/17/22 0327 10/18/22 0503 10/19/22 0605 10/20/22 1227  WBC 44.9* 51.4* 37.0* 31.5* 29.0* 33.6*  NEUTROABS 37.8*  --  30.6* 24.7* 21.7* 26.9*  HGB 11.0* 11.0* 9.9* 10.0* 10.3* 10.9*  HCT 31.9* 30.7* 29.3* 29.2* 30.0* 30.8*  MCV 90.1 88.0 89.3 89.8 88.0 86.5  PLT 234 186 190 201 258 349      Lab Results  Component Value Date   HEPBSAG NON REACTIVE 10/15/2022   HEPBIGM NON REACTIVE 10/15/2022      Microbiology:  Recent Results (from the past 240 hour(s))  SARS Coronavirus 2 by RT PCR (hospital order, performed in [facility] hospital lab) *cepheid single result test* Anterior Nasal Swab     Status: None   Collection Time: 10/13/22  3:55 PM   Specimen: Anterior Nasal Swab  Result Value Ref Range Status   SARS Coronavirus 2 by RT PCR NEGATIVE NEGATIVE Final    Comment: (NOTE) SARS-CoV-2 target nucleic acids are NOT DETECTED.  The SARS-CoV-2 RNA is generally detectable in upper and lower respiratory specimens during the acute phase of infection. The lowest concentration of SARS-CoV-2 viral copies this assay can detect is 250 copies / mL. A negative result does not preclude SARS-CoV-2 infection and should not be used as the sole basis for treatment or other patient  management decisions.  A negative result may occur with improper specimen collection / handling, submission of specimen other than nasopharyngeal swab, presence of viral mutation(s) within the areas targeted by this assay, and inadequate number of viral copies (<250 copies / mL). A negative result must be combined with clinical observations, patient history, and epidemiological information.  Fact Sheet for Patients:   RoadLapTop.co.za  Fact Sheet for Healthcare Providers: http://kim-miller.com/  This test is not yet approved or  cleared by the [place] FDA and has been authorized for detection and/or diagnosis of SARS-CoV-2 by FDA under an Emergency Use  Authorization (EUA).  This EUA will remain in effect (meaning this test can be used) for the duration of the COVID-19 declaration under Section 564(b)(1) of the Act, 21 U.S.C. section 360bbb-3(b)(1), unless the authorization is terminated or revoked sooner.  Performed at [facility], [address] [place], [place] [address]   MRSA Next Gen by PCR, Nasal     Status: None   Collection Time: 10/13/22  9:51 PM   Specimen: Nasal Mucosa; Nasal Swab  Result Value Ref Range Status   MRSA by PCR Next Gen NOT DETECTED NOT DETECTED Final    Comment: (NOTE) The GeneXpert MRSA Assay (FDA approved for NASAL specimens only), is one component of a comprehensive MRSA colonization surveillance program. It is not intended to diagnose MRSA infection nor to guide or monitor treatment for MRSA infections. Test performance is not FDA approved in patients less than 69 years old. Performed at [facility], [address] [place], [place] [address]   Urine Culture     Status: Abnormal   Collection Time: 10/14/22  7:32 AM   Specimen: Urine, Random  Result Value Ref Range Status   Specimen Description   Final    URINE, RANDOM Performed at [facility], [address] [place], [place] [address]    Special Requests   Final    NONE Reflexed from [id number] Performed at [facility], [address] [place], [place] [address]    Culture >=100,000 COLONIES/mL KLEBSIELLA PNEUMONIAE (A)  Final   Report Status 10/16/2022 FINAL  Final   Organism ID, Bacteria KLEBSIELLA PNEUMONIAE (A)  Final      Susceptibility   Klebsiella pneumoniae - MIC*    AMPICILLIN >=32 RESISTANT Resistant     CEFAZOLIN <=4 SENSITIVE Sensitive     CEFEPIME <=0.12 SENSITIVE Sensitive     CEFTRIAXONE <=0.25 SENSITIVE Sensitive     CIPROFLOXACIN <=0.25 SENSITIVE Sensitive     GENTAMICIN <=1 SENSITIVE Sensitive     IMIPENEM <=0.25 SENSITIVE Sensitive     NITROFURANTOIN 128 RESISTANT Resistant      TRIMETH/SULFA <=20 SENSITIVE Sensitive     AMPICILLIN/SULBACTAM 8 SENSITIVE Sensitive     PIP/TAZO <=4 SENSITIVE Sensitive     * >=100,000 COLONIES/mL KLEBSIELLA PNEUMONIAE  Culture, blood (Routine X 2) w Reflex to ID Panel     Status: None   Collection Time: 10/15/22  1:29 AM   Specimen: Right Antecubital; Blood  Result Value Ref Range Status   Specimen Description RIGHT ANTECUBITAL  Final   Special Requests   Final    BOTTLES DRAWN AEROBIC AND ANAEROBIC Blood Culture adequate volume   Culture   Final    NO GROWTH 5 DAYS Performed at [facility], [address], [place], [place] [address]    Report Status 10/20/2022 FINAL  Final  Culture, blood (Routine X 2) w Reflex to ID Panel  Status: None   Collection Time: 10/15/22 11:41 AM   Specimen: BLOOD  Result Value Ref Range Status   Specimen Description BLOOD BLOOD RIGHT HAND  Final   Special Requests   Final    BOTTLES DRAWN AEROBIC ONLY Blood Culture results may not be optimal due to an inadequate volume of blood received in culture bottles   Culture   Final    NO GROWTH 5 DAYS Performed at [facility], [address], [place], [place] [address]    Report Status 10/20/2022 FINAL  Final  Body fluid culture w Gram Stain     Status: None (Preliminary result)   Collection Time: 10/17/22  2:02 PM   Specimen: Synovium; Body Fluid  Result Value Ref Range Status   Specimen Description   Final    SYNOVIAL Performed at [facility], [address] [place], [place] [address]    Special Requests   Final    Normal Performed at [facility], [address] [place], [place] [address]    Gram Stain   Final    FEW WBC PRESENT,BOTH PMN AND MONONUCLEAR NO ORGANISMS SEEN    Culture   Final    NO GROWTH 3 DAYS Performed at [facility], [address] [place], [place] [address]    Report Status PENDING  Incomplete    Coagulation Studies: No results for input(s): "LABPROT", "INR" in  the last 72 hours.   Urinalysis: No results for input(s): "COLORURINE", "LABSPEC", "PHURINE", "GLUCOSEU", "HGBUR", "BILIRUBINUR", "KETONESUR", "PROTEINUR", "UROBILINOGEN", "NITRITE", "LEUKOCYTESUR" in the last 72 hours.  Invalid input(s): "APPERANCEUR"     Imaging: No results found.   Medications:      vitamin C  500 mg Oral BID   cephALEXin  500 mg Oral Q12H   Chlorhexidine Gluconate Cloth  6 each Topical Daily   Chlorhexidine Gluconate Cloth  6 each Topical Q0600   cyanocobalamin  1,000 mcg Oral Daily   feeding supplement  237 mL Oral TID BM   folic acid  1 mg Oral Daily   heparin injection (subcutaneous)  5,000 Units Subcutaneous Q8H   multivitamin with minerals  1 tablet Oral Daily   nicotine  14 mg Transdermal Daily   thiamine  100 mg Oral Daily   Or   thiamine  100 mg Intravenous Daily   Vitamin D (Ergocalciferol)  50,000 Units Oral Q7 days   acetaminophen, alteplase, chlorproMAZINE, heparin, melatonin, morphine injection, ondansetron **OR** ondansetron (ZOFRAN) IV, mouth rinse, polyethylene glycol, prochlorperazine, traMADol, traZODone  Assessment/ Plan:  72 y.o. male with  tobacco use, alcohol abuse, osteoarthritis status post left knee replacement and hyperlipidemia, frequent falls over the past few weeks since July 4 th admitted on 10/13/2022 for AKI (acute kidney injury) (HCC) [N17.9]   Acute Kidney Injury on chronic kidney disease stage IIIB   Baseline creatinine of 1.74, GFR of 41 on 08/18/22 Suspect patient's acute kidney injury secondary to NSAID induced nephropathy and concurrent sepsis. No history of diabetes or hypertension per patient. Renal ultrasound with no obstruction. No IV contrast. -Underwent first hemodialysis treatment on 10/15/2022.   -Renal function essentially unchanged from yesterday - Patient and family agreeable to dialysis treatment today.  - Will discontinue HD temp cath after treatment and monitor labs.   Sepsis Urinary source-urine  culture from 10/14/2022 is growing Klebsiella. Completed iv Rocephin and Vancomycin   LOS: 7 Wendee Beavers 9/5/20242:34 PM  [facility]: [address] [place], [place] [phone]

## 2022-10-20 NOTE — [place] FL2 (Signed)
[place] MEDICAID FL2 LEVEL OF CARE FORM     IDENTIFICATION  Patient Name: Joshua Mora Birthdate: [birth year] Sex: male Admission Date (Current Location): 10/13/2022  [place] and [place] Number:  Chiropodist and Address:  [facility], [address], [place], [place] [address]      Provider Number: [id number]  Attending Physician Name and Address:  Arnetha Courser, MD  Relative Name and Phone Number:  Joseenrique, Sifers (Daughter)  [phone] [facility])    Current Level of Care: Hospital Recommended Level of Care: Skilled Nursing Facility Prior Approval Number:    Date Approved/Denied:   PASRR Number: [id number]  Discharge Plan: SNF    Current Diagnoses: Patient Active Problem List   Diagnosis Date Noted   Acute hypoxemic respiratory failure (HCC) 10/16/2022   Elevated d-dimer 10/16/2022   Severe sepsis (HCC) 10/15/2022   Traumatic rhabdomyolysis (HCC) 10/15/2022   Acute renal failure superimposed on stage 3b chronic kidney disease (HCC) 10/13/2022   Leukocytosis 10/13/2022   Frequent falls 10/13/2022   History of colonic polyps    Polyp of colon    Chest pain 04/27/2015   Hyperlipidemia 07/23/2014   Nicotine dependence, uncomplicated 07/23/2014   Type 2 diabetes mellitus without complications (HCC) 07/23/2014    Orientation RESPIRATION BLADDER Height & Weight     Self, Time, Situation, Place  O2 (023L) External catheter, Continent Weight: 88.1 kg Height:  5\' 9"  (175.3 cm)  BEHAVIORAL SYMPTOMS/MOOD NEUROLOGICAL BOWEL NUTRITION STATUS  Other (Comment) (n/a)  (n/a) Incontinent Diet (2 gram)  AMBULATORY STATUS COMMUNICATION OF NEEDS Skin   Limited Assist Verbally Bruising (Left knee)                       Personal Care Assistance Level of Assistance  Bathing, Dressing Bathing Assistance: Limited assistance   Dressing Assistance: Limited assistance     Functional Limitations Info  Sight Sight Info: Impaired         SPECIAL CARE FACTORS FREQUENCY  PT (By licensed PT), OT (By licensed OT)     PT Frequency: Min 2x weekly OT Frequency: Min 2x weekly            Contractures Contractures Info: Not present    Additional Factors Info  Code Status, Allergies Code Status Info: Full Allergies Info: No Known Allergies           Current Medications (10/20/2022):  This is the current hospital active medication list Current Facility-Administered Medications  Medication Dose Route Frequency Provider Last Rate Last Admin   acetaminophen (TYLENOL) tablet 650 mg  650 mg Oral Q6H PRN Andris Baumann, MD   650 mg at 10/19/22 2313   alteplase (CATHFLO ACTIVASE) injection 2 mg  2 mg Intracatheter Once PRN Wendee Beavers, NP       ascorbic acid (VITAMIN C) tablet 500 mg  500 mg Oral BID Dow Adolph N, DO   500 mg at 10/20/22 0956   cephALEXin (KEFLEX) capsule 500 mg  500 mg Oral Q12H Aleda Grana, RPH   500 mg at 10/20/22 5643   Chlorhexidine Gluconate Cloth 2 % PADS 6 each  6 each Topical Daily Arnetha Courser, MD   6 each at 10/20/22 1421   Chlorhexidine Gluconate Cloth 2 % PADS 6 each  6 each Topical Q0600 Wendee Beavers, NP   6 each at 10/20/22 0537   chlorproMAZINE (THORAZINE) tablet 10 mg  10 mg Oral TID PRN Arnetha Courser, MD   10 mg at  10/16/22 1221   cyanocobalamin (VITAMIN B12) tablet 1,000 mcg  1,000 mcg Oral Daily Arnetha Courser, MD   1,000 mcg at 10/20/22 0956   feeding supplement (ENSURE ENLIVE / ENSURE PLUS) liquid 237 mL  237 mL Oral TID BM Dow Adolph N, DO   237 mL at 10/19/22 1417   folic acid (FOLVITE) tablet 1 mg  1 mg Oral Daily Dow Adolph N, DO   1 mg at 10/20/22 1610   heparin injection 1,000 Units  1,000 Units Intracatheter PRN Wendee Beavers, NP   2,800 Units at 10/15/22 2240   heparin injection 5,000 Units  5,000 Units Subcutaneous Q8H Mila Merry A, RPH   5,000 Units at 10/20/22 1420   melatonin tablet 5 mg  5 mg Oral QHS PRN Dow Adolph N, DO   5 mg at 10/18/22  2130   morphine (PF) 2 MG/ML injection 2 mg  2 mg Intravenous Q4H PRN Mansy, Jan A, MD   2 mg at 10/20/22 0108   multivitamin with minerals tablet 1 tablet  1 tablet Oral Daily Dow Adolph N, DO   1 tablet at 10/20/22 9604   nicotine (NICODERM CQ - dosed in mg/24 hours) patch 14 mg  14 mg Transdermal Daily Arnetha Courser, MD   14 mg at 10/20/22 0959   ondansetron (ZOFRAN) tablet 4 mg  4 mg Oral Q6H PRN Arnetha Courser, MD       Or   ondansetron (ZOFRAN) injection 4 mg  4 mg Intravenous Q6H PRN Arnetha Courser, MD   4 mg at 10/14/22 5409   Oral care mouth rinse  15 mL Mouth Rinse PRN Arnetha Courser, MD       polyethylene glycol (MIRALAX / GLYCOLAX) packet 17 g  17 g Oral Daily PRN Arnetha Courser, MD       prochlorperazine (COMPAZINE) injection 5 mg  5 mg Intravenous Q6H PRN Dow Adolph N, DO       thiamine (VITAMIN B1) tablet 100 mg  100 mg Oral Daily [place], Carole N, DO   100 mg at 10/20/22 8119   Or   thiamine (VITAMIN B1) injection 100 mg  100 mg Intravenous Daily Dow Adolph N, DO   100 mg at 10/15/22 0947   traMADol (ULTRAM) tablet 50 mg  50 mg Oral Q8H PRN Arnetha Courser, MD   50 mg at 10/19/22 2110   traZODone (DESYREL) tablet 25 mg  25 mg Oral QHS PRN Mansy, Jan A, MD   25 mg at 10/20/22 0108   Vitamin D (Ergocalciferol) (DRISDOL) 1.25 MG (50000 UNIT) capsule 50,000 Units  50,000 Units Oral Q7 days Darlin Drop, DO   50,000 Units at 10/14/22 1638     Discharge Medications: Please see discharge summary for a list of discharge medications.  Relevant Imaging Results:  Relevant Lab Results:   Additional Information SSN# [id number]  Truddie Hidden, RN

## 2022-10-20 NOTE — TOC Progression Note (Signed)
Transition of Care [facility]) - Progression Note    Patient Details  Name: Joshua Mora MRN: [id number] Date of Birth: [birth year]  Transition of Care [facility]) CM/SW Contact  Truddie Hidden, RN Phone Number: 10/20/2022, 2:49 PM  Clinical Narrative:    Bed search started.      Barriers to Discharge: Continued Medical Work up  Expected Discharge Plan and Services     Post Acute Care Choice: Home Health, Skilled Nursing Facility Living arrangements for the past 2 months: Single Family Home                                       Social Determinants of Health (SDOH) Interventions SDOH Screenings   Food Insecurity: No Food Insecurity (10/13/2022)  Housing: Low Risk  (10/13/2022)  Transportation Needs: No Transportation Needs (10/13/2022)  Utilities: Not At Risk (10/13/2022)  Tobacco Use: High Risk (10/13/2022)    Readmission Risk Interventions     No data to display

## 2022-10-20 NOTE — Progress Notes (Addendum)
Physical Therapy Treatment Patient Details Name: Joshua Mora MRN: [id number] DOB: 26-Jun-1950 Today's Date: 10/20/2022   History of Present Illness Pt is a 72 year old presenting to the ED with progressive weakness and frequent falls; admitted with AKI, leukocytosis  PMH significant for osteoarthritis s/p left knee replacement. Pt found to have L fibular head fx.    PT Comments  Pt found supine in bed with daughter in room. After cues, encouragement, and education on benefits of mobilization, pt willing to work with therapy, putting forth fair effort throughout session. Pt requiring +2 for bed mobility today in order to minimize L ankle pain. Once seated on EOB pt needing min-mod A to maintain balance but once assisted to EOB progressed to intermittent min A/CGA with UE support. Pt able to tolerate duration of seated balance for ~10 min. While seated pt performed a few exercises for LE activation and ROM. Finished session educating pt on importance of frequent and overall mobility in order to maintain functional activity tolerance. Pt will benefit from continued PT services upon discharge to safely address deficits listed in patient problem list for decreased caregiver assistance and eventual return to PLOF.    If plan is discharge home, recommend the following: Two people to help with walking and/or transfers;Two people to help with bathing/dressing/bathroom;Help with stairs or ramp for entrance;Assist for transportation   Can travel by private vehicle     No  Equipment Recommendations  Other (comment) (TBD at next venue of care)    Recommendations for Other Services       Precautions / Restrictions Precautions Precautions: Fall Restrictions Weight Bearing Restrictions: Yes LLE Weight Bearing: Weight bearing as tolerated Other Position/Activity Restrictions: No brace needed on LLE     Mobility  Bed Mobility Overal bed mobility: Needs Assistance Bed Mobility: Supine to Sit,  Sit to Supine     Supine to sit: +2 for physical assistance, HOB elevated, Mod assist Sit to supine: +2 for physical assistance, HOB elevated, Mod assist   General bed mobility comments: +2 Mod A for bed mobility    Transfers                   General transfer comment: Not attempted today secondary to L ankle pain    Ambulation/Gait                   Stairs             Wheelchair Mobility     Tilt Bed    Modified Rankin (Stroke Patients Only)       Balance Overall balance assessment: Needs assistance Sitting-balance support: Bilateral upper extremity supported, Feet unsupported Sitting balance-Leahy Scale: Fair                                      Cognition Arousal: Lethargic Behavior During Therapy: Flat affect Overall Cognitive Status: Impaired/Different from baseline                                          Exercises Total Joint Exercises Ankle Circles/Pumps: 10 reps, AROM, Both (little to no movement seen with L foot) Short Arc Quad: AROM, Both, 5 reps Heel Slides: 5 reps, Both, AAROM Other Exercises Other Exercises: Performed few bed exercises secondary to L ankle pain,  educated pt on importance on keeping LE's active when in bed or sitting in recliner. Pt verbalized understanding    General Comments   PT noting pt's L ankle increased bruising and pain more prominent this session, additionally noting increased external rotation in resting posture and decreased ROM, and hypersensitive to any distal LLE palpation      Pertinent Vitals/Pain Pain Assessment Pain Assessment: 0-10 Pain Score: 10-Worst pain ever Pain Location: L ankle Pain Descriptors / Indicators: Sore, Aching, Grimacing Pain Intervention(s): Monitored during session, Limited activity within patient's tolerance    Home Living                          Prior Function            PT Goals (current goals can now be found  in the care plan section) Progress towards PT goals: Progressing toward goals    Frequency    Min 1X/week      PT Plan      Co-evaluation              AM-PAC PT "6 Clicks" Mobility   Outcome Measure  Help needed turning from your back to your side while in a flat bed without using bedrails?: A Lot Help needed moving from lying on your back to sitting on the side of a flat bed without using bedrails?: Total Help needed moving to and from a bed to a chair (including a wheelchair)?: Total Help needed standing up from a chair using your arms (e.g., wheelchair or bedside chair)?: Total Help needed to walk in hospital room?: Total Help needed climbing 3-5 steps with a railing? : Total 6 Click Score: 7    End of Session Equipment Utilized During Treatment: Gait belt Activity Tolerance: Patient tolerated treatment well Patient left: in bed;with call bell/phone within reach;with family/visitor present;with bed alarm set Nurse Communication: Mobility status PT Visit Diagnosis: Muscle weakness (generalized) (M62.81);Repeated falls (R29.6);Difficulty in walking, not elsewhere classified (R26.2)     Time: 1610-9604 PT Time Calculation (min) (ACUTE ONLY): 26 min  Charges:                            Cecile Sheerer, SPT 10/20/22, 3:36 PM This entire session was performed under direct supervision and direction of a licensed therapist/therapist assistant. I have personally read, edited and approve of the note as written.  Loran Senters, DPT

## 2022-10-20 NOTE — Progress Notes (Signed)
Progress Note   Patient: Joshua Mora ZOX:[id number] DOB: Sep 18, 1950 DOA: 10/13/2022     7 DOS: the patient was seen and examined on 10/20/2022   Brief hospital course: Taken from prior notes.  Rigoverto Stonebarger is a 72 y.o. male with medical history significant of alcohol abuse, tobacco use disorder, diet controlled prediabetes, CKD 3B, osteoarthritis s/p left knee replacement who presented to the ED with complaints of progressive weakness and frequent falls over the past 1 month.     On presentation he was found to have severely elevated creatinine above baseline for which nephrology was consulted.  Also with significant leukocytosis above 46,000 with concern for leukemoid reaction in the setting of presumptive UTI.  Trauma imaging were nonacute.  CT head also nonacute.  Hospital course was complicated by acute hypoxic respiratory failure.  Was found to have mild pulmonary edema on chest x-ray.  Elevated D-dimer above 11 with concern for possible pulmonary embolism.  Heparin drip initiated.  9/1: Overnight became febrile at 100.7.  Urine cultures with Klebsiella pneumonia which are sensitive to ceftriaxone.  Had bilateral knee injuries which does not appear infected.  Worsening leukocytosis.  VQ scan negative for PE, lower extremity venous Doppler negative for DVT, discontinuing heparin infusion.  Adding vancomycin to broaden up the coverage.  Also concern of Leukomed reaction versus any underlying undiagnosed leukemia-hematology was consulted. Also ordered labs to rule out DIC due to worsening D-dimer.  9/2: Some improvement in leukocytosis, hematology ordered few more labs, if he continued to have persistently elevated white cell count then he might need bone marrow for further evaluation. Also consulted orthopedic surgery to get their opinion on this recent left nondisplaced fibular neck fracture, slight worsening of creatinine but good UOP.  Nephrology would like to wait before  proceeding for another dialysis.  9/3: Renal function seems stable.  Preliminary left knee aspirate with no growth.  Improving leukocytosis.  ID was also consulted.  Can transfer to progressive now.  9/4: Slowly improving creatinine but patient remained elevated.  Nephrology to decide again tomorrow if he need another dialysis.  Improving leukocytosis, elevated kappa and lambda with normal ratio, rest of the labs pending.  ID is now on board and likely switch to p.o. antibiotics soon.  9/5: Some worsening confusion overnight with poor p.o. intake.  Renal function seems stable but GFR remains at 13-going for another dialysis today.  Assessment and Plan: * Acute renal failure superimposed on stage 3b chronic kidney disease (HCC) Patient with poor p.o. intake, BUN of 72 and creatinine of 6.30 on admission, it was 1.74 early July when he was first seen with fall. Renal ultrasound negative for any obstruction, noted an ill-defined echogenic area in the right upper pole of kidney which can be followed up later on with designated CT scan. Patient was also using NSAID regularly. -Nephrology consult-patient received first dialysis on 10/15/2022, renal function although stable but GFR remained at 13 with worsening BUN, poor p.o. intake. Going for another dialysis today -Avoid nephrotoxins  Severe sepsis [facility]) Patient met sepsis criteria with tachycardia, severe leukocytosis and severe sepsis with lactic acidosis above 4.3.  Initial procalcitonin was 60.67 which is decreasing. Did develop a low-grade fever overnight again.  Procalcitonin improving.  Preliminary blood cultures negative, urine cultures with Klebsiella pneumonia which shows only resistance to ampicillin and Augmentin.  Patient did received ceftriaxone for the past 3 days.  Vancomycin was added on 10/16/2022.  Left knee aspirate with no growth so far. -ID  consult -Continue ceftriaxone -Continue vancomycin -Continue to  monitor  Leukocytosis Started worsening again today, pathologist smear review with immature granulocytes, initial smear review with bandemia.  Urine cultures with Klebsiella pneumonia but patient is being treated with appropriate antibiotics. Significantly elevated D-dimer which continued to get worse.  VTE has been ruled out with negative VQ scan and lower extremity venous Doppler. -Continue with ceftriaxone and vancomycin for now -Hematology consult-ordered some more labs, likely leukemoid reaction with severe inflammation and infection but if remain elevated he will need bone marrow biopsy for further evaluation  Elevated d-dimer Patient with worsening D-dimer, now at 13.  COVID was negative. DVT has been ruled out with negative VQ scan and lower extremity venous Doppler. -Stop heparin infusion -Checking labs for DIC -Continue to monitor  Acute hypoxemic respiratory failure (HCC) Patient did develop acute hypoxic respiratory failure and repeat chest x-ray at that time with concern of pulmonary congestion.  Echocardiogram with normal EF and grade 1 diastolic dysfunction.  VQ scan negative for PE Patient with significant AKI, also concern of NSAID induced renal injury. Started on dialysis. -Continue IV Lasix -Continue supplemental oxygen-wean as tolerated  Frequent falls Patient with history of recent decline, poor appetite and multiple falls.  First fall was on July 4 and since then he was falling couple of times a week.  Left knee imaging done on 8/23 with a concern of nondisplaced fibular neck fracture, Initial plan was outpatient follow-up with orthopedic surgery. Requested orthopedic surgery to evaluate while he is in hospital B12 normal, significantly low vitamin D-started on supplement. PT/OT are recommending SNF  Nicotine dependence, uncomplicated -Nicotine patch  Traumatic rhabdomyolysis (HCC) Likely secondary to recurrent falls.      Subjective: Patient was feeling  little more tired and lethargic today.  Poor p.o. intake.  He was quite agitated overnight.  Physical Exam: Vitals:   10/20/22 0414 10/20/22 0812 10/20/22 1143 10/20/22 1529  BP: 123/60 122/65 (!) 123/59 (!) 145/91  Pulse: 93 91 88 99  Resp: 18 20 18 18   Temp: (!) 97.5 F (36.4 C) 98.1 F (36.7 C) 97.6 F (36.4 C) 97.9 F (36.6 C)  TempSrc: Oral Oral Oral   SpO2: 91% 96% 97% 100%  Weight: 88.1 kg     Height:       General.  Ill-appearing elderly man, in no acute distress. Pulmonary.  Lungs clear bilaterally, normal respiratory effort. CV.  Regular rate and rhythm, no JVD, rub or murmur. Abdomen.  Soft, nontender, nondistended, BS positive. CNS.  Appears lethargic.  No focal neurologic deficit. Extremities.  No edema, no cyanosis, pulses intact and symmetrical.     Data Reviewed: Prior data reviewed  Family Communication: Discussed with daughter at bedside  Disposition: Status is: Inpatient Remains inpatient appropriate because: Severity of illness  Planned Discharge Destination: Skilled nursing facility  DVT prophylaxis.  Subcu heparin Time spent: 45 minutes  This record has been created using Conservation officer, historic buildings. Errors have been sought and corrected,but may not always be located. Such creation errors do not reflect on the standard of care.   Author: Arnetha Courser, MD 10/20/2022 4:02 PM  For on call review [email or web address].

## 2022-10-21 ENCOUNTER — Ambulatory Visit (HOSPITAL_COMMUNITY)
Admit: 2022-10-21 | Discharge: 2022-10-21 | Disposition: A | Discharge status: Home / Self Care | Payer: Medicare PPO | Source: Other Acute Inpatient Hospital | Attending: Internal Medicine | Admitting: Internal Medicine

## 2022-10-21 ENCOUNTER — Inpatient Hospital Stay: Payer: Medicare PPO

## 2022-10-21 ENCOUNTER — Encounter: Payer: Self-pay | Admitting: Radiology

## 2022-10-21 ENCOUNTER — Other Ambulatory Visit: Payer: Self-pay | Admitting: Internal Medicine

## 2022-10-21 DIAGNOSIS — D72825 Bandemia: Secondary | ICD-10-CM | POA: Diagnosis not present

## 2022-10-21 DIAGNOSIS — D75839 Thrombocytosis, unspecified: Secondary | ICD-10-CM | POA: Insufficient documentation

## 2022-10-21 DIAGNOSIS — A419 Sepsis, unspecified organism: Secondary | ICD-10-CM | POA: Diagnosis not present

## 2022-10-21 DIAGNOSIS — D649 Anemia, unspecified: Secondary | ICD-10-CM | POA: Insufficient documentation

## 2022-10-21 DIAGNOSIS — D72829 Elevated white blood cell count, unspecified: Secondary | ICD-10-CM | POA: Diagnosis not present

## 2022-10-21 DIAGNOSIS — J9601 Acute respiratory failure with hypoxia: Secondary | ICD-10-CM | POA: Diagnosis not present

## 2022-10-21 DIAGNOSIS — N179 Acute kidney failure, unspecified: Secondary | ICD-10-CM | POA: Diagnosis not present

## 2022-10-21 LAB — RENAL FUNCTION PANEL
Albumin: 2.5 g/dL — ABNORMAL LOW (ref 3.5–5.0)
Anion gap: 13 (ref 5–15)
BUN: 101 mg/dL — ABNORMAL HIGH (ref 8–23)
CO2: 22 mmol/L (ref 22–32)
Calcium: 9 mg/dL (ref 8.9–10.3)
Chloride: 105 mmol/L (ref 98–111)
Creatinine, Ser: 4.28 mg/dL — ABNORMAL HIGH (ref 0.61–1.24)
GFR, Estimated: 14 mL/min — ABNORMAL LOW (ref >60)
Glucose, Bld: 104 mg/dL — ABNORMAL HIGH (ref 70–99)
Phosphorus: 6.2 mg/dL — ABNORMAL HIGH (ref 2.5–4.6)
Potassium: 5.7 mmol/L — ABNORMAL HIGH (ref 3.5–5.1)
Sodium: 140 mmol/L (ref 135–145)

## 2022-10-21 LAB — CBC
HCT: 32.1 % — ABNORMAL LOW (ref 39.0–52.0)
Hemoglobin: 10.9 g/dL — ABNORMAL LOW (ref 13.0–17.0)
MCH: 29.9 pg (ref 26.0–34.0)
MCHC: 34 g/dL (ref 30.0–36.0)
MCV: 88.2 fL (ref 80.0–100.0)
Platelets: 421 10*3/uL — ABNORMAL HIGH (ref 150–400)
RBC: 3.64 MIL/uL — ABNORMAL LOW (ref 4.22–5.81)
RDW: 16.9 % — ABNORMAL HIGH (ref 11.5–15.5)
WBC: 36.3 10*3/uL — ABNORMAL HIGH (ref 4.0–10.5)
nRBC: 0 % (ref 0.0–0.2)

## 2022-10-21 LAB — BODY FLUID CULTURE W GRAM STAIN
Culture: NO GROWTH
Special Requests: NORMAL

## 2022-10-21 LAB — GLUCOSE, CAPILLARY
Glucose-Capillary: 93 mg/dL (ref 70–99)
Glucose-Capillary: 110 mg/dL — ABNORMAL HIGH (ref 70–99)

## 2022-10-21 MED ORDER — SODIUM ZIRCONIUM CYCLOSILICATE 10 G PO PACK
10.0000 g | PACK | Freq: Every day | ORAL | Status: AC
  Administered 2022-10-21 – 2022-10-22 (×2): 10 g via ORAL
  Filled 2022-10-21 (×2): qty 1

## 2022-10-21 MED ORDER — HEPARIN SODIUM (PORCINE) 1000 UNIT/ML IJ SOLN
INTRAMUSCULAR | Status: AC
  Filled 2022-10-21: qty 3

## 2022-10-21 MED ORDER — ALTEPLASE 2 MG IJ SOLR
2.0000 mg | Freq: Once | INTRAMUSCULAR | Status: AC | PRN
  Administered 2022-10-27: 2 mg
  Filled 2022-10-21: qty 2

## 2022-10-21 MED ORDER — HYDROXYZINE HCL 10 MG PO TABS
10.0000 mg | ORAL_TABLET | Freq: Once | ORAL | Status: AC
  Administered 2022-10-21: 10 mg via ORAL
  Filled 2022-10-21: qty 1

## 2022-10-21 MED ORDER — HEPARIN SODIUM (PORCINE) 1000 UNIT/ML DIALYSIS
1000.0000 [IU] | INTRAMUSCULAR | Status: DC | PRN
  Filled 2022-10-21: qty 1

## 2022-10-21 NOTE — Progress Notes (Signed)
Hemodialysis note  Received patient in bed to unit. Alert and oriented x 2.  Informed consent signed and in chart.  Treatment initiated: 1508 Treatment completed: 1810  Patient tolerated well. Transported back to [facility], alert without acute distress.  Report given to patient's RN.   Access used: Right internal jugular HD Ctheter Access issues: none  Total UF removed: 0 Medication(s) given:  none  Post HD weight: 82.6 kg   [facility]

## 2022-10-21 NOTE — Progress Notes (Signed)
PT Cancellation Note  Patient Details Name: Joshua Mora MRN: [id number] DOB: 12-03-1950   Cancelled Treatment:    Reason Eval/Treat Not Completed: Patient at procedure or test/unavailable, will attempt to see pt at a future date/time as medically appropriate.    Ovidio Hanger PT, DPT 10/21/22, 3:04 PM

## 2022-10-21 NOTE — Care Management Important Message (Signed)
Important Message  Patient Details  Name: Joshua Mora MRN: [id number] Date of Birth: May 20, 1950   Medicare Important Message Given:  Yes     Olegario Messier A Samson Ralph 10/21/2022, 12:01 PM

## 2022-10-21 NOTE — Plan of Care (Signed)
  Problem: Clinical Measurements: Goal: Diagnostic test results will improve Outcome: Progressing Goal: Respiratory complications will improve Outcome: Progressing Goal: Cardiovascular complication will be avoided Outcome: Progressing   Problem: Coping: Goal: Level of anxiety will decrease Outcome: Progressing   Problem: Elimination: Goal: Will not experience complications related to bowel motility Outcome: Progressing Goal: Will not experience complications related to urinary retention Outcome: Progressing   Problem: Pain Managment: Goal: General experience of comfort will improve Outcome: Progressing

## 2022-10-21 NOTE — Progress Notes (Signed)
Patient will go to [facility] [place] 6th floor bay 7 per [facility]. [facility] called and aware.

## 2022-10-21 NOTE — Progress Notes (Signed)
Received patient in bed.Alert to person and place.Alert and oriented to place and person. He signed his own consent .  Access used : Right internal jugular  catheter.Dressing changed done today.  Duration of treatment : 3.5 hours.  Fluid removed: Even.  Hemo comment: Tolerated treatment.  Hand off to the patient's nurse to [facility]

## 2022-10-21 NOTE — Progress Notes (Signed)
Occupational Therapy Treatment Patient Details Name: Joshua Mora MRN: [id number] DOB: 12-05-1950 Today's Date: 10/21/2022   History of present illness Pt is a 72 year old presenting to the ED with progressive weakness and frequent falls; admitted with AKI, leukocytosis  PMH significant for osteoarthritis s/p left knee replacement. Pt found to have L fibular head fx.   OT comments  Chart reviewed to date, pt greeted in bed with son present. Pt with B mitts, nursing reports pt is pulling at lines. Pt is alert, oriented to self, place; not oriented to situation however does report he hurt his knee. Pt with increased edema around L ankle with leg noted to be externally rotated and ankle dorsiflexed. Nursing notified, MD notified with MD reporting more imaging to be completed. OOB activities deferred. MAX A for long sit in bed and boost in bed. SET UP for grooming tasks. OT will continue to follow acutely.       If plan is discharge home, recommend the following:  A lot of help with walking and/or transfers;A lot of help with bathing/dressing/bathroom;Supervision due to cognitive status;Assist for transportation;Direct supervision/assist for medications management;Direct supervision/assist for financial management;Help with stairs or ramp for entrance   Equipment Recommendations  Other (comment) (per next venue of care)    Recommendations for Other Services      Precautions / Restrictions Precautions Precautions: Fall Precaution Comments: no restrictions per nursing orders, pt has home/personal L knee brace on per chart provided from [facility]; team notified (not donned on this date) Restrictions Weight Bearing Restrictions: Yes LLE Weight Bearing: Weight bearing as tolerated       Mobility Bed Mobility               General bed mobility comments: supine>long sit with MAX A, boost up bed with pt assisting and use of bed features with MAX A    Transfers                          Balance                                           ADL either performed or assessed with clinical judgement   ADL Overall ADL's : Needs assistance/impaired     Grooming: Sitting;Minimal assistance               Lower Body Dressing: Maximal assistance     Toilet Transfer Details (indicate cue type and reason): deferred on this date           General ADL Comments: further out of bed ADLs deferred at this time due to L ankle, MD reporting ordering further imaging    Extremity/Trunk Assessment              Vision       Perception     Praxis      Cognition Arousal: Alert Behavior During Therapy: Flat affect Overall Cognitive Status: Impaired/Different from baseline Area of Impairment: Attention, Memory, Following commands, Orientation, Safety/judgement, Awareness, Problem solving                 Orientation Level: Disoriented to, Situation, Time Current Attention Level: Sustained   Following Commands: Follows one step commands with increased time Safety/Judgement: Decreased awareness of deficits, Decreased awareness of safety Awareness: Emergent Problem Solving: Slow processing, Difficulty sequencing, Requires verbal  cues, Requires tactile cues          Exercises      Shoulder Instructions       General Comments vss throughout, L ankle with increasing edema, externally rotated with extreme pain with any type of patient driven movement    Pertinent Vitals/ Pain       Pain Assessment Pain Assessment: 0-10 Pain Score: 10-Worst pain ever Pain Location: L ankle Pain Descriptors / Indicators: Sore, Aching, Grimacing Pain Intervention(s): Monitored during session, Limited activity within patient's tolerance, Patient requesting pain meds-RN notified  Home Living                                          Prior Functioning/Environment              Frequency  Min 1X/week         Progress Toward Goals  OT Goals(current goals can now be found in the care plan section)  Progress towards OT goals: Progressing toward goals     Plan      Co-evaluation                 AM-PAC OT "6 Clicks" Daily Activity     Outcome Measure   Help from another person eating meals?: A Little Help from another person taking care of personal grooming?: A Little Help from another person toileting, which includes using toliet, bedpan, or urinal?: Total Help from another person bathing (including washing, rinsing, drying)?: Total Help from another person to put on and taking off regular upper body clothing?: A Lot Help from another person to put on and taking off regular lower body clothing?: A Lot 6 Click Score: 12    End of Session Equipment Utilized During Treatment: Oxygen  OT Visit Diagnosis: Other abnormalities of gait and mobility (R26.89);Muscle weakness (generalized) (M62.81)   Activity Tolerance Treatment limited secondary to medical complications (Comment) (L ankle pain, furthing imaging impeeding further OOB attempts)   Patient Left in bed;with call bell/phone within reach;with family/visitor present;with nursing/sitter in room   Nurse Communication Mobility status        Time: 1100-1118 OT Time Calculation (min): 18 min  Charges: OT General Charges $OT Visit: 1 Visit OT Treatments $Therapeutic Activity: 8-22 mins  Oleta Mouse, OTD OTR/L  10/21/22, 1:19 PM

## 2022-10-21 NOTE — Progress Notes (Signed)
Progress Note   Patient: Joshua Mora WUJ:[id number] DOB: 1950/04/14 DOA: 10/13/2022     8 DOS: the patient was seen and examined on 10/21/2022   Brief hospital course: Taken from prior notes.  Joshua Mora is a 72 y.o. male with medical history significant of alcohol abuse, tobacco use disorder, diet controlled prediabetes, CKD 3B, osteoarthritis s/p left knee replacement who presented to the ED with complaints of progressive weakness and frequent falls over the past 1 month.     On presentation he was found to have severely elevated creatinine above baseline for which nephrology was consulted.  Also with significant leukocytosis above 46,000 with concern for leukemoid reaction in the setting of presumptive UTI.  Trauma imaging were nonacute.  CT head also nonacute.  Hospital course was complicated by acute hypoxic respiratory failure.  Was found to have mild pulmonary edema on chest x-ray.  Elevated D-dimer above 11 with concern for possible pulmonary embolism.  Heparin drip initiated.  9/1: Overnight became febrile at 100.7.  Urine cultures with Klebsiella pneumonia which are sensitive to ceftriaxone.  Had bilateral knee injuries which does not appear infected.  Worsening leukocytosis.  VQ scan negative for PE, lower extremity venous Doppler negative for DVT, discontinuing heparin infusion.  Adding vancomycin to broaden up the coverage.  Also concern of Leukomed reaction versus any underlying undiagnosed leukemia-hematology was consulted. Also ordered labs to rule out DIC due to worsening D-dimer.  9/2: Some improvement in leukocytosis, hematology ordered few more labs, if he continued to have persistently elevated white cell count then he might need bone marrow for further evaluation. Also consulted orthopedic surgery to get their opinion on this recent left nondisplaced fibular neck fracture, slight worsening of creatinine but good UOP.  Nephrology would like to wait before  proceeding for another dialysis.  9/3: Renal function seems stable.  Preliminary left knee aspirate with no growth.  Improving leukocytosis.  ID was also consulted.  Can transfer to progressive now.  9/4: Slowly improving creatinine but patient remained elevated.  Nephrology to decide again tomorrow if he need another dialysis.  Improving leukocytosis, elevated kappa and lambda with normal ratio, rest of the labs pending.  ID is now on board and likely switch to p.o. antibiotics soon.  9/5: Some worsening confusion overnight with poor p.o. intake.  Renal function seems stable but GFR remains at 13-going for another dialysis today.  9/6: Vitals and renal function stable, unable to do HD yesterday due to mechanical issues with machine, plan to do hemodialysis today at [facility].  Potassium 5.7-Lokelma ordered.  Worsening leukocytosis, message sent to hematology. Ordered left ankle MRI due to worsening pain and swelling, x-ray was negative. Antibiotics were stopped yesterday.  Assessment and Plan: * Acute renal failure superimposed on stage 3b chronic kidney disease (HCC) Patient with poor p.o. intake, BUN of 72 and creatinine of 6.30 on admission, it was 1.74 early July when he was first seen with fall. Renal ultrasound negative for any obstruction, noted an ill-defined echogenic area in the right upper pole of kidney which can be followed up later on with designated CT scan. Patient was also using NSAID regularly. -Nephrology consult-patient received first dialysis on 10/15/2022, renal function although stable , poor p.o. intake. Going for another dialysis today-patient will be going to [facility] and coming back then after dialysis -Avoid nephrotoxins  Severe sepsis [facility]) Patient met sepsis criteria with tachycardia, severe leukocytosis and severe sepsis with lactic acidosis above 4.3.  Initial procalcitonin  was 60.67 which is decreasing. Did develop a low-grade fever overnight again.   Procalcitonin improving.  Preliminary blood cultures negative, urine cultures with Klebsiella pneumonia which shows only resistance to ampicillin and Augmentin.  Patient did received ceftriaxone for the past 3 days.  Vancomycin was added on 10/16/2022.  Left knee aspirate with no growth so far. -ID consult -Completed the course of antibiotic and ID is advising monitoring without antibiotic for now. -Continue to monitor  Leukocytosis Continue to get worse, pathologist smear review with immature granulocytes, initial smear review with bandemia.  Urine cultures with Klebsiella pneumonia but patient is being treated with appropriate antibiotics. Significantly elevated D-dimer which continued to get worse.  VTE has been ruled out with negative VQ scan and lower extremity venous Doppler. -Continue with ceftriaxone and vancomycin for now -Hematology consult-ordered some more labs, likely leukemoid reaction with severe inflammation and infection but if remain elevated he will need bone marrow biopsy for further evaluation -Another message sent to hematology  Elevated d-dimer Patient with worsening D-dimer, now at 13.  COVID was negative. DVT has been ruled out with negative VQ scan and lower extremity venous Doppler. -Stop heparin infusion -Checking labs for DIC -Continue to monitor  Acute hypoxemic respiratory failure (HCC) Patient did develop acute hypoxic respiratory failure and repeat chest x-ray at that time with concern of pulmonary congestion.  Echocardiogram with normal EF and grade 1 diastolic dysfunction.  VQ scan negative for PE Patient with significant AKI, also concern of NSAID induced renal injury. Started on dialysis. -Continue IV Lasix -Continue supplemental oxygen-wean as tolerated  Frequent falls Patient with history of recent decline, poor appetite and multiple falls.  First fall was on July 4 and since then he was falling couple of times a week.  Left knee imaging done on 8/23  with a concern of nondisplaced fibular neck fracture, Initial plan was outpatient follow-up with orthopedic surgery. Requested orthopedic surgery to evaluate while he is in hospital B12 normal, significantly low vitamin D-started on supplement. PT/OT are recommending SNF  Nicotine dependence, uncomplicated -Nicotine patch  Traumatic rhabdomyolysis (HCC) Likely secondary to recurrent falls.      Subjective: Patient had a little rough night, he was intermittently agitated.  Somnolent during the time of morning around.  Appetite remained poor.  Son was concerned about worsening pain and swelling of left ankle.  Physical Exam: Vitals:   10/20/22 1529 10/20/22 2341 10/21/22 0500 10/21/22 0804  BP: (!) 145/91 (!) 131/59  (!) 142/60  Pulse: 99 94  96  Resp: 18 18  17   Temp: 97.9 F (36.6 C) (!) 97.5 F (36.4 C)  97.8 F (36.6 C)  TempSrc:      SpO2: 100% 96%  95%  Weight:   89 kg   Height:       General.  Ill-appearing elderly man, in no acute distress. Pulmonary.  Lungs clear bilaterally, normal respiratory effort. CV.  Regular rate and rhythm, no JVD, rub or murmur. Abdomen.  Soft, nontender, nondistended, BS positive. CNS.  Alert and oriented .  No focal neurologic deficit. Extremities.  Worsening edema with ecchymosis of left ankle  Data Reviewed: Prior data reviewed  Family Communication: Discussed with son at bedside  Disposition: Status is: Inpatient Remains inpatient appropriate because: Severity of illness  Planned Discharge Destination: Skilled nursing facility  DVT prophylaxis.  Subcu heparin Time spent: 45 minutes  This record has been created using Conservation officer, historic buildings. Errors have been sought and corrected,but may not always  be located. Such creation errors do not reflect on the standard of care.   Author: Arnetha Courser, MD 10/21/2022 12:40 PM  For on call review [email or web address].

## 2022-10-21 NOTE — Progress Notes (Signed)
Persistent leukocytosis unclear etiology-infection issues resolved as per primary team.  Order a bone marrow biopsy- for 9/09.  Discussed with the primary team.  GB

## 2022-10-21 NOTE — Progress Notes (Signed)
Manual pressure held for 3 min at site. Site covered with vaseline gauze, gauze and tegaderm. No bleeding, redness or swelling at site. Instructed pt to lay flat for 30 min and leave dressing in place for 24 hrs. Daughter states understanding and pt tolerated procedure well. Unit RN and daughter at bedside advised as well.

## 2022-10-21 NOTE — Progress Notes (Addendum)
[facility], [place] 10/21/22  Subjective:   Hospital day # 8  Patient seen sitting up in bed Daughter at bedside More somnolent today, but easily aroused and oriented Continues to have pain in left leg   Renal: 09/05 0701 - 09/06 0700 In: -  Out: 800 [Urine:800] Lab Results  Component Value Date   CREATININE 4.28 (H) 10/21/2022   CREATININE 4.50 (H) 10/20/2022   CREATININE 4.47 (H) 10/19/2022     Objective:  Vital signs in last 24 hours:  Temp:  [97.5 F (36.4 C)-97.9 F (36.6 C)] 97.8 F (36.6 C) (09/06 0804) Pulse Rate:  [94-99] 96 (09/06 0804) Resp:  [17-18] 17 (09/06 0804) BP: (131-145)/(59-91) 142/60 (09/06 0804) SpO2:  [95 %-100 %] 95 % (09/06 0804) Weight:  [89 kg] 89 kg (09/06 0500)  Weight change: 0.9 kg Filed Weights   10/18/22 0500 10/20/22 0414 10/21/22 0500  Weight: 86.7 kg 88.1 kg 89 kg    Intake/Output:    Intake/Output Summary (Last 24 hours) at 10/21/2022 1302 Last data filed at 10/21/2022 1037 Gross per 24 hour  Intake 120 ml  Output 550 ml  Net -430 ml     Physical Exam: General: Critically ill-appearing  HEENT Moist oral mucous membranes  Pulm/lungs [place] O2  CVS/Heart Irregular  Abdomen:  Soft, mildly distended, nontender  Extremities: Dependent edema present  Neurologic: Alert, responds to simple commands  Skin: Warm, LLE bruising  Access: Right IJ temp cath       Basic Metabolic Panel:  Recent Labs  Lab 10/15/22 0129 10/16/22 0347 10/17/22 0327 10/18/22 0503 10/19/22 0605 10/20/22 0427 10/21/22 0352  NA 140 138 135 135 137 137 140  K 3.9 3.8 4.2 4.6 4.7 5.1 5.7*  CL 104 101 99 100 103 103 105  CO2 24 23 23 23 22  21* 22  GLUCOSE 131* 121* 121* 115* 113* 118* 104*  BUN 80* 67* 87* 91* 105* 103* 101*  CREATININE 6.18* 4.32* 4.80* 4.71* 4.47* 4.50* 4.28*  CALCIUM 8.1* 8.2* 8.3* 8.6* 8.8* 8.8* 9.0  MG 1.8 1.8 2.1  --   --   --   --   PHOS 4.2 3.6 4.7* 5.6* 5.6* 5.9* 6.2*      CBC: Recent Labs  Lab 10/15/22 0127 10/16/22 0347 10/17/22 0327 10/18/22 0503 10/19/22 0605 10/20/22 1227 10/21/22 0352  WBC 44.9*   < > 37.0* 31.5* 29.0* 33.6* 36.3*  NEUTROABS 37.8*  --  30.6* 24.7* 21.7* 26.9*  --   HGB 11.0*   < > 9.9* 10.0* 10.3* 10.9* 10.9*  HCT 31.9*   < > 29.3* 29.2* 30.0* 30.8* 32.1*  MCV 90.1   < > 89.3 89.8 88.0 86.5 88.2  PLT 234   < > 190 201 258 349 421*   < > = values in this interval not displayed.      Lab Results  Component Value Date   HEPBSAG NON REACTIVE 10/15/2022   HEPBIGM NON REACTIVE 10/15/2022      Microbiology:  Recent Results (from the past 240 hour(s))  SARS Coronavirus 2 by RT PCR (hospital order, performed in [facility] hospital lab) *cepheid single result test* Anterior Nasal Swab     Status: None   Collection Time: 10/13/22  3:55 PM   Specimen: Anterior Nasal Swab  Result Value Ref Range Status   SARS Coronavirus 2 by RT PCR NEGATIVE NEGATIVE Final    Comment: (NOTE) SARS-CoV-2 target nucleic acids are NOT DETECTED.  The SARS-CoV-2 RNA is  generally detectable in upper and lower respiratory specimens during the acute phase of infection. The lowest concentration of SARS-CoV-2 viral copies this assay can detect is 250 copies / mL. A negative result does not preclude SARS-CoV-2 infection and should not be used as the sole basis for treatment or other patient management decisions.  A negative result may occur with improper specimen collection / handling, submission of specimen other than nasopharyngeal swab, presence of viral mutation(s) within the areas targeted by this assay, and inadequate number of viral copies (<250 copies / mL). A negative result must be combined with clinical observations, patient history, and epidemiological information.  Fact Sheet for Patients:   RoadLapTop.co.za  Fact Sheet for Healthcare Providers: http://kim-miller.com/  This test is  not yet approved or  cleared by the [place] FDA and has been authorized for detection and/or diagnosis of SARS-CoV-2 by FDA under an Emergency Use Authorization (EUA).  This EUA will remain in effect (meaning this test can be used) for the duration of the COVID-19 declaration under Section 564(b)(1) of the Act, 21 U.S.C. section 360bbb-3(b)(1), unless the authorization is terminated or revoked sooner.  Performed at [facility], [address] [place], [place] [address]   MRSA Next Gen by PCR, Nasal     Status: None   Collection Time: 10/13/22  9:51 PM   Specimen: Nasal Mucosa; Nasal Swab  Result Value Ref Range Status   MRSA by PCR Next Gen NOT DETECTED NOT DETECTED Final    Comment: (NOTE) The GeneXpert MRSA Assay (FDA approved for NASAL specimens only), is one component of a comprehensive MRSA colonization surveillance program. It is not intended to diagnose MRSA infection nor to guide or monitor treatment for MRSA infections. Test performance is not FDA approved in patients less than 71 years old. Performed at [facility], [address], [place], [place] [address]   Urine Culture     Status: Abnormal   Collection Time: 10/14/22  7:32 AM   Specimen: Urine, Random  Result Value Ref Range Status   Specimen Description   Final    URINE, RANDOM Performed at [facility], [address] [place], [place] [address]    Special Requests   Final    NONE Reflexed from [id number] Performed at [facility], [address] [place], [place] [address]    Culture >=100,000 COLONIES/mL KLEBSIELLA PNEUMONIAE (A)  Final   Report Status 10/16/2022 FINAL  Final   Organism ID, Bacteria KLEBSIELLA PNEUMONIAE (A)  Final      Susceptibility   Klebsiella pneumoniae - MIC*    AMPICILLIN >=32 RESISTANT Resistant     CEFAZOLIN <=4 SENSITIVE Sensitive     CEFEPIME <=0.12 SENSITIVE Sensitive     CEFTRIAXONE <=0.25 SENSITIVE Sensitive     CIPROFLOXACIN  <=0.25 SENSITIVE Sensitive     GENTAMICIN <=1 SENSITIVE Sensitive     IMIPENEM <=0.25 SENSITIVE Sensitive     NITROFURANTOIN 128 RESISTANT Resistant     TRIMETH/SULFA <=20 SENSITIVE Sensitive     AMPICILLIN/SULBACTAM 8 SENSITIVE Sensitive     PIP/TAZO <=4 SENSITIVE Sensitive     * >=100,000 COLONIES/mL KLEBSIELLA PNEUMONIAE  Culture, blood (Routine X 2) w Reflex to ID Panel     Status: None   Collection Time: 10/15/22  1:29 AM   Specimen: Right Antecubital; Blood  Result Value Ref Range Status   Specimen Description RIGHT ANTECUBITAL  Final   Special Requests   Final    BOTTLES DRAWN AEROBIC AND ANAEROBIC  Blood Culture adequate volume   Culture   Final    NO GROWTH 5 DAYS Performed at [facility], [address] [place], [place] [address]    Report Status 10/20/2022 FINAL  Final  Culture, blood (Routine X 2) w Reflex to ID Panel     Status: None   Collection Time: 10/15/22 11:41 AM   Specimen: BLOOD  Result Value Ref Range Status   Specimen Description BLOOD BLOOD RIGHT HAND  Final   Special Requests   Final    BOTTLES DRAWN AEROBIC ONLY Blood Culture results may not be optimal due to an inadequate volume of blood received in culture bottles   Culture   Final    NO GROWTH 5 DAYS Performed at [facility], [address], [place], [place] [address]    Report Status 10/20/2022 FINAL  Final  Body fluid culture w Gram Stain     Status: None   Collection Time: 10/17/22  2:02 PM   Specimen: Synovium; Body Fluid  Result Value Ref Range Status   Specimen Description   Final    SYNOVIAL Performed at [facility], [address] [place], [place] [address]    Special Requests   Final    Normal Performed at [facility], [address] [place], [place] [address]    Gram Stain   Final    FEW WBC PRESENT,BOTH PMN AND MONONUCLEAR NO ORGANISMS SEEN    Culture   Final    NO GROWTH 3 DAYS Performed at [facility], [address] [place], [place] [address]    Report Status 10/21/2022 FINAL  Final    Coagulation Studies: No results for input(s): "LABPROT", "INR" in the last 72 hours.   Urinalysis: No results for input(s): "COLORURINE", "LABSPEC", "PHURINE", "GLUCOSEU", "HGBUR", "BILIRUBINUR", "KETONESUR", "PROTEINUR", "UROBILINOGEN", "NITRITE", "LEUKOCYTESUR" in the last 72 hours.  Invalid input(s): "APPERANCEUR"     Imaging: No results found.   Medications:      vitamin C  500 mg Oral BID   Chlorhexidine Gluconate Cloth  6 each Topical Daily   Chlorhexidine Gluconate Cloth  6 each Topical Q0600   cyanocobalamin  1,000 mcg Oral Daily   feeding supplement  237 mL Oral TID BM   folic acid  1 mg Oral Daily   heparin injection (subcutaneous)  5,000 Units Subcutaneous Q8H   multivitamin with minerals  1 tablet Oral Daily   nicotine  14 mg Transdermal Daily   sodium zirconium cyclosilicate  10 g Oral Daily   thiamine  100 mg Oral Daily   Or   thiamine  100 mg Intravenous Daily   Vitamin D (Ergocalciferol)  50,000 Units Oral Q7 days   acetaminophen, alteplase, chlorproMAZINE, heparin, melatonin, morphine injection, ondansetron **OR** ondansetron (ZOFRAN) IV, mouth rinse, polyethylene glycol, prochlorperazine, traMADol, traZODone  Assessment/ Plan:  72 y.o. male with  tobacco use, alcohol abuse, osteoarthritis status post left knee replacement and hyperlipidemia, frequent falls over the past few weeks since July 4 th admitted on 10/13/2022 for AKI (acute kidney injury) (HCC) [N17.9]   Acute Kidney Injury on chronic kidney disease stage IIIB   Baseline creatinine of 1.74, GFR of 41 on 08/18/22 Suspect patient's acute kidney injury secondary to NSAID induced nephropathy and concurrent sepsis. No history of diabetes or hypertension per patient. Renal ultrasound with no obstruction. No IV contrast. -Underwent first hemodialysis treatment on 10/15/2022.   -Creatinine continues to slowly decrease.  - Urine  output did increase  overnight.  - Plan to dialyze were postponed yesterday. Will plan for dialysis later today.  - Will discontinue HD temp cath after treatment and monitor labs.   Sepsis Urinary source-urine culture from 10/14/2022 is growing Klebsiella. Completed iv Rocephin and Vancomycin  Hyperkalemia Potassium 5.7. Ordered Lokelma and will further correct with dialysis   LOS: 8 [employer] 9/6/20241:02 PM  [facility] [place], [place] [phone]

## 2022-10-22 DIAGNOSIS — J9601 Acute respiratory failure with hypoxia: Secondary | ICD-10-CM | POA: Diagnosis not present

## 2022-10-22 DIAGNOSIS — A419 Sepsis, unspecified organism: Secondary | ICD-10-CM | POA: Diagnosis not present

## 2022-10-22 DIAGNOSIS — M25572 Pain in left ankle and joints of left foot: Secondary | ICD-10-CM | POA: Insufficient documentation

## 2022-10-22 DIAGNOSIS — D72825 Bandemia: Secondary | ICD-10-CM | POA: Diagnosis not present

## 2022-10-22 DIAGNOSIS — N179 Acute kidney failure, unspecified: Secondary | ICD-10-CM | POA: Diagnosis not present

## 2022-10-22 LAB — CBC WITH DIFFERENTIAL/PLATELET
Abs Immature Granulocytes: 0.92 10*3/uL — ABNORMAL HIGH (ref 0.00–0.07)
Basophils Absolute: 0.1 10*3/uL (ref 0.0–0.1)
Basophils Relative: 0 %
Eosinophils Absolute: 0.2 10*3/uL (ref 0.0–0.5)
Eosinophils Relative: 0 %
HCT: 30.9 % — ABNORMAL LOW (ref 39.0–52.0)
Hemoglobin: 10.7 g/dL — ABNORMAL LOW (ref 13.0–17.0)
Immature Granulocytes: 2 %
Lymphocytes Relative: 8 %
Lymphs Abs: 3 10*3/uL (ref 0.7–4.0)
MCH: 30.1 pg (ref 26.0–34.0)
MCHC: 34.6 g/dL (ref 30.0–36.0)
MCV: 86.8 fL (ref 80.0–100.0)
Monocytes Absolute: 3.3 10*3/uL — ABNORMAL HIGH (ref 0.1–1.0)
Monocytes Relative: 9 %
Neutro Abs: 30.4 10*3/uL — ABNORMAL HIGH (ref 1.7–7.7)
Neutrophils Relative %: 81 %
Platelets: 492 10*3/uL — ABNORMAL HIGH (ref 150–400)
RBC: 3.56 MIL/uL — ABNORMAL LOW (ref 4.22–5.81)
RDW: 16.1 % — ABNORMAL HIGH (ref 11.5–15.5)
WBC: 37.9 10*3/uL — ABNORMAL HIGH (ref 4.0–10.5)
nRBC: 0 % (ref 0.0–0.2)

## 2022-10-22 LAB — RENAL FUNCTION PANEL
Albumin: 2.4 g/dL — ABNORMAL LOW (ref 3.5–5.0)
Anion gap: 11 (ref 5–15)
BUN: 55 mg/dL — ABNORMAL HIGH (ref 8–23)
CO2: 28 mmol/L (ref 22–32)
Calcium: 8.2 mg/dL — ABNORMAL LOW (ref 8.9–10.3)
Chloride: 99 mmol/L (ref 98–111)
Creatinine, Ser: 2.88 mg/dL — ABNORMAL HIGH (ref 0.61–1.24)
GFR, Estimated: 22 mL/min — ABNORMAL LOW (ref >60)
Glucose, Bld: 100 mg/dL — ABNORMAL HIGH (ref 70–99)
Phosphorus: 4.7 mg/dL — ABNORMAL HIGH (ref 2.5–4.6)
Potassium: 5 mmol/L (ref 3.5–5.1)
Sodium: 138 mmol/L (ref 135–145)

## 2022-10-22 LAB — GLUCOSE, CAPILLARY: Glucose-Capillary: 85 mg/dL (ref 70–99)

## 2022-10-22 NOTE — Progress Notes (Signed)
The CCMD called and notified the author that patient had an episode of nonsustained vtach at 11 beats, the patient is asleep upon entering the room and woke the patient and ask if the patient is okay. The patient just answer back "what are we going to do now" and get back to sleep. The author notified NP B. Jon Billings, EKG was suggested by the author but B. Jon Billings said that no need for the EKG since the episode of vtach is non sustainable and asymptomatic. Continue to observe and monitor the patient.

## 2022-10-22 NOTE — Progress Notes (Signed)
Progress Note   Patient: Joshua Mora ZOX:[id number] DOB: 1950/10/03 DOA: 10/13/2022     9 DOS: the patient was seen and examined on 10/22/2022   Brief hospital course: Taken from prior notes.  Jecorey Fusillo is a 72 y.o. male with medical history significant of alcohol abuse, tobacco use disorder, diet controlled prediabetes, CKD 3B, osteoarthritis s/p left knee replacement who presented to the ED with complaints of progressive weakness and frequent falls over the past 1 month.     On presentation he was found to have severely elevated creatinine above baseline for which nephrology was consulted.  Also with significant leukocytosis above 46,000 with concern for leukemoid reaction in the setting of presumptive UTI.  Trauma imaging were nonacute.  CT head also nonacute.  Hospital course was complicated by acute hypoxic respiratory failure.  Was found to have mild pulmonary edema on chest x-ray.  Elevated D-dimer above 11 with concern for possible pulmonary embolism.  Heparin drip initiated.  9/1: Overnight became febrile at 100.7.  Urine cultures with Klebsiella pneumonia which are sensitive to ceftriaxone.  Had bilateral knee injuries which does not appear infected.  Worsening leukocytosis.  VQ scan negative for PE, lower extremity venous Doppler negative for DVT, discontinuing heparin infusion.  Adding vancomycin to broaden up the coverage.  Also concern of Leukomed reaction versus any underlying undiagnosed leukemia-hematology was consulted. Also ordered labs to rule out DIC due to worsening D-dimer.  9/2: Some improvement in leukocytosis, hematology ordered few more labs, if he continued to have persistently elevated white cell count then he might need bone marrow for further evaluation. Also consulted orthopedic surgery to get their opinion on this recent left nondisplaced fibular neck fracture, slight worsening of creatinine but good UOP.  Nephrology would like to wait before  proceeding for another dialysis.  9/3: Renal function seems stable.  Preliminary left knee aspirate with no growth.  Improving leukocytosis.  ID was also consulted.  Can transfer to progressive now.  9/4: Slowly improving creatinine but patient remained elevated.  Nephrology to decide again tomorrow if he need another dialysis.  Improving leukocytosis, elevated kappa and lambda with normal ratio, rest of the labs pending.  ID is now on board and likely switch to p.o. antibiotics soon.  9/5: Some worsening confusion overnight with poor p.o. intake.  Renal function seems stable but GFR remains at 13-going for another dialysis today.  9/6: Vitals and renal function stable, unable to do HD yesterday due to mechanical issues with machine, plan to do hemodialysis today at [facility].  Potassium 5.7-Lokelma ordered.  Worsening leukocytosis, message sent to hematology. Ordered left ankle MRI due to worsening pain and swelling, x-ray was negative. Antibiotics were stopped yesterday.  9/7: Still feeling weak, renal function improved after getting dialysis yesterday, temperature catheter was removed and he will be monitor over the weekend.  Still oliguric.  Worsening leukocytosis.  Likely will get bone marrow biopsy on Monday-hematology placed orders. MRI of left ankle with complete tear of talofibular ligament and multiple other ligamental injuries-podiatry was consulted  Assessment and Plan: * Acute renal failure superimposed on stage 3b chronic kidney disease (HCC) Patient with poor p.o. intake, BUN of 72 and creatinine of 6.30 on admission, it was 1.74 early July when he was first seen with fall. Renal ultrasound negative for any obstruction, noted an ill-defined echogenic area in the right upper pole of kidney which can be followed up later on with designated CT scan. Patient was also using NSAID  regularly. -Nephrology consult-patient received first dialysis on 10/15/2022, renal function although stable  , poor p.o. intake. Patient received total of 2 dialysis so far.  Last 1 was yesterday. Temporary catheter was removed and nephrology will observe over the weekend -Avoid nephrotoxins  Severe sepsis [facility]) Patient met sepsis criteria with tachycardia, severe leukocytosis and severe sepsis with lactic acidosis above 4.3.  Initial procalcitonin was 60.67 which is decreasing. Did develop a low-grade fever overnight again.  Procalcitonin improving.  Preliminary blood cultures negative, urine cultures with Klebsiella pneumonia which shows only resistance to ampicillin and Augmentin.  Patient did received ceftriaxone for the past 3 days.  Vancomycin was added on 10/16/2022.  Left knee aspirate with no growth so far. -ID consult -Completed the course of antibiotic and ID is advising monitoring without antibiotic for now. -Continue to monitor  Leukocytosis Continue to get worse, pathologist smear review with immature granulocytes, initial smear review with bandemia.  Urine cultures with Klebsiella pneumonia but patient is being treated with appropriate antibiotics. Significantly elevated D-dimer which continued to get worse.  VTE has been ruled out with negative VQ scan and lower extremity venous Doppler. -Continue with ceftriaxone and vancomycin for now -Hematology consult-ordered some more labs, likely leukemoid reaction with severe inflammation and infection but if remain elevated he will need bone marrow biopsy for further evaluation -Bone marrow biopsy likely on Monday  Elevated d-dimer Patient with worsening D-dimer, now at 13.  COVID was negative. DVT has been ruled out with negative VQ scan and lower extremity venous Doppler. -Stop heparin infusion -Checking labs for DIC -Continue to monitor  Acute hypoxemic respiratory failure (HCC) Patient did develop acute hypoxic respiratory failure and repeat chest x-ray at that time with concern of pulmonary congestion.  Echocardiogram with normal EF  and grade 1 diastolic dysfunction.  VQ scan negative for PE Patient with significant AKI, also concern of NSAID induced renal injury. Started on dialysis. -Continue IV Lasix -Continue supplemental oxygen-wean as tolerated  Frequent falls Patient with history of recent decline, poor appetite and multiple falls.  First fall was on July 4 and since then he was falling couple of times a week.  Left knee imaging done on 8/23 with a concern of nondisplaced fibular neck fracture, Initial plan was outpatient follow-up with orthopedic surgery. Requested orthopedic surgery to evaluate while he is in hospital B12 normal, significantly low vitamin D-started on supplement. PT/OT are recommending SNF  Nicotine dependence, uncomplicated -Nicotine patch  Traumatic rhabdomyolysis (HCC) Likely secondary to recurrent falls.  Left ankle pain Patient continued to have worsening pain and edema of left ankle prompted an MRI of left ankle as initial imaging was negative for any fracture. MRI with complete tear of talofibular ligament and multiple other ligamental injuries. -Podiatry was consulted -Continue with pain management      Subjective: Patient continued to have significant left ankle pain.  Feeling very weak and lethargic  Physical Exam: Vitals:   10/21/22 1928 10/21/22 2344 10/22/22 0328 10/22/22 0851  BP: 133/61 134/72 (!) 124/51 122/60  Pulse: (!) 117 (!) 104 89 73  Resp: 18 18 18 18   Temp: 98.6 F (37 C) 99 F (37.2 C) 99 F (37.2 C) 98.1 F (36.7 C)  TempSrc:    Oral  SpO2: 100% 94% 97%   Weight:      Height:       General.  Chronically ill-appearing elderly man in no acute distress. Pulmonary.  Lungs clear bilaterally, normal respiratory effort. CV.  Regular rate and  rhythm, no JVD, rub or murmur. Abdomen.  Soft, nontender, nondistended, BS positive. CNS.  Alert and oriented .  No focal neurologic deficit. Extremities.  No edema, no cyanosis, pulses intact and symmetrical.   Significant edema and tenderness around left ankle.   Data Reviewed: Prior data reviewed  Family Communication: Discussed with daughter at bedside  Disposition: Status is: Inpatient Remains inpatient appropriate because: Severity of illness  Planned Discharge Destination: Skilled nursing facility  DVT prophylaxis.  Subcu heparin Time spent: 45 minutes  This record has been created using Conservation officer, historic buildings. Errors have been sought and corrected,but may not always be located. Such creation errors do not reflect on the standard of care.   Author: Arnetha Courser, MD 10/22/2022 3:18 PM  For on call review [email or web address].

## 2022-10-22 NOTE — Assessment & Plan Note (Signed)
Patient continued to have worsening pain and edema of left ankle prompted an MRI of left ankle as initial imaging was negative for any fracture. MRI with complete tear of talofibular ligament and multiple other ligamental injuries. -Podiatry was consulted-recommending conservative management with pain control and CAM boot for 4 to 6 weeks and outpatient follow-up -Continue with pain management

## 2022-10-22 NOTE — Progress Notes (Signed)
[facility] [place] 10/22/22  Subjective:   Hospital day # 9  Patient seen sitting up in bed Poor appetite today   Renal: 09/06 0701 - 09/07 0700 In: 120 [P.O.:120] Out: 200 [Urine:200] Lab Results  Component Value Date   CREATININE 2.88 (H) 10/22/2022   CREATININE 4.28 (H) 10/21/2022   CREATININE 4.50 (H) 10/20/2022     Objective:  Vital signs in last 24 hours:  Temp:  [98 F (36.7 C)-99 F (37.2 C)] 98.1 F (36.7 C) (09/07 0851) Pulse Rate:  [73-117] 73 (09/07 0851) Resp:  [15-21] 18 (09/07 0851) BP: (118-157)/(51-97) 122/60 (09/07 0851) SpO2:  [94 %-100 %] 97 % (09/07 0328) Weight:  [82.6 kg-88.9 kg] 82.6 kg (09/06 1810)  Weight change: -6.4 kg Filed Weights   10/21/22 0500 10/21/22 1453 10/21/22 1810  Weight: 89 kg 82.6 kg 82.6 kg    Intake/Output:    Intake/Output Summary (Last 24 hours) at 10/22/2022 1250 Last data filed at 10/22/2022 1100 Gross per 24 hour  Intake 220 ml  Output 300 ml  Net -80 ml     Physical Exam: General: Critically ill-appearing  HEENT Moist oral mucous membranes  Pulm/lungs [place] O2  CVS/Heart Irregular  Abdomen:  Soft, mildly distended, nontender  Extremities: Dependent edema present  Neurologic: Alert, responds to simple commands  Skin: Warm, LLE bruising  Access: None       Basic Metabolic Panel:  Recent Labs  Lab 10/16/22 0347 10/17/22 0327 10/18/22 0503 10/19/22 0605 10/20/22 0427 10/21/22 0352 10/22/22 0834  NA 138 135 135 137 137 140 138  K 3.8 4.2 4.6 4.7 5.1 5.7* 5.0  CL 101 99 100 103 103 105 99  CO2 23 23 23 22  21* 22 28  GLUCOSE 121* 121* 115* 113* 118* 104* 100*  BUN 67* 87* 91* 105* 103* 101* 55*  CREATININE 4.32* 4.80* 4.71* 4.47* 4.50* 4.28* 2.88*  CALCIUM 8.2* 8.3* 8.6* 8.8* 8.8* 9.0 8.2*  MG 1.8 2.1  --   --   --   --   --   PHOS 3.6 4.7* 5.6* 5.6* 5.9* 6.2* 4.7*     CBC: Recent Labs  Lab 10/17/22 0327 10/18/22 0503 10/19/22 0605 10/20/22 1227  10/21/22 0352 10/22/22 0834  WBC 37.0* 31.5* 29.0* 33.6* 36.3* 37.9*  NEUTROABS 30.6* 24.7* 21.7* 26.9*  --  30.4*  HGB 9.9* 10.0* 10.3* 10.9* 10.9* 10.7*  HCT 29.3* 29.2* 30.0* 30.8* 32.1* 30.9*  MCV 89.3 89.8 88.0 86.5 88.2 86.8  PLT 190 201 258 349 421* 492*      Lab Results  Component Value Date   HEPBSAG NON REACTIVE 10/15/2022   HEPBIGM NON REACTIVE 10/15/2022      Microbiology:  Recent Results (from the past 240 hour(s))  SARS Coronavirus 2 by RT PCR (hospital order, performed in [facility] hospital lab) *cepheid single result test* Anterior Nasal Swab     Status: None   Collection Time: 10/13/22  3:55 PM   Specimen: Anterior Nasal Swab  Result Value Ref Range Status   SARS Coronavirus 2 by RT PCR NEGATIVE NEGATIVE Final    Comment: (NOTE) SARS-CoV-2 target nucleic acids are NOT DETECTED.  The SARS-CoV-2 RNA is generally detectable in upper and lower respiratory specimens during the acute phase of infection. The lowest concentration of SARS-CoV-2 viral copies this assay can detect is 250 copies / mL. A negative result does not preclude SARS-CoV-2 infection and should not be used as the sole basis for treatment or other  patient management decisions.  A negative result may occur with improper specimen collection / handling, submission of specimen other than nasopharyngeal swab, presence of viral mutation(s) within the areas targeted by this assay, and inadequate number of viral copies (<250 copies / mL). A negative result must be combined with clinical observations, patient history, and epidemiological information.  Fact Sheet for Patients:   RoadLapTop.co.za  Fact Sheet for Healthcare Providers: http://kim-miller.com/  This test is not yet approved or  cleared by the [place] FDA and has been authorized for detection and/or diagnosis of SARS-CoV-2 by FDA under an Emergency Use Authorization (EUA).  This EUA  will remain in effect (meaning this test can be used) for the duration of the COVID-19 declaration under Section 564(b)(1) of the Act, 21 U.S.C. section 360bbb-3(b)(1), unless the authorization is terminated or revoked sooner.  Performed at [facility], [address] [place], [place] [address]   MRSA Next Gen by PCR, Nasal     Status: None   Collection Time: 10/13/22  9:51 PM   Specimen: Nasal Mucosa; Nasal Swab  Result Value Ref Range Status   MRSA by PCR Next Gen NOT DETECTED NOT DETECTED Final    Comment: (NOTE) The GeneXpert MRSA Assay (FDA approved for NASAL specimens only), is one component of a comprehensive MRSA colonization surveillance program. It is not intended to diagnose MRSA infection nor to guide or monitor treatment for MRSA infections. Test performance is not FDA approved in patients less than 35 years old. Performed at [facility], [address] [place], [place] [address]   Urine Culture     Status: Abnormal   Collection Time: 10/14/22  7:32 AM   Specimen: Urine, Random  Result Value Ref Range Status   Specimen Description   Final    URINE, RANDOM Performed at [facility], [address] [place], [place] [address]    Special Requests   Final    NONE Reflexed from [id number] Performed at [facility], [address] [place], [place] [address]    Culture >=100,000 COLONIES/mL KLEBSIELLA PNEUMONIAE (A)  Final   Report Status 10/16/2022 FINAL  Final   Organism ID, Bacteria KLEBSIELLA PNEUMONIAE (A)  Final      Susceptibility   Klebsiella pneumoniae - MIC*    AMPICILLIN >=32 RESISTANT Resistant     CEFAZOLIN <=4 SENSITIVE Sensitive     CEFEPIME <=0.12 SENSITIVE Sensitive     CEFTRIAXONE <=0.25 SENSITIVE Sensitive     CIPROFLOXACIN <=0.25 SENSITIVE Sensitive     GENTAMICIN <=1 SENSITIVE Sensitive     IMIPENEM <=0.25 SENSITIVE Sensitive     NITROFURANTOIN 128 RESISTANT Resistant     TRIMETH/SULFA <=20 SENSITIVE  Sensitive     AMPICILLIN/SULBACTAM 8 SENSITIVE Sensitive     PIP/TAZO <=4 SENSITIVE Sensitive     * >=100,000 COLONIES/mL KLEBSIELLA PNEUMONIAE  Culture, blood (Routine X 2) w Reflex to ID Panel     Status: None   Collection Time: 10/15/22  1:29 AM   Specimen: Right Antecubital; Blood  Result Value Ref Range Status   Specimen Description RIGHT ANTECUBITAL  Final   Special Requests   Final    BOTTLES DRAWN AEROBIC AND ANAEROBIC Blood Culture adequate volume   Culture   Final    NO GROWTH 5 DAYS Performed at [facility], [address], [place], [place] [address]    Report Status 10/20/2022 FINAL  Final  Culture, blood (Routine X 2) w Reflex to ID Panel  Status: None   Collection Time: 10/15/22 11:41 AM   Specimen: BLOOD  Result Value Ref Range Status   Specimen Description BLOOD BLOOD RIGHT HAND  Final   Special Requests   Final    BOTTLES DRAWN AEROBIC ONLY Blood Culture results may not be optimal due to an inadequate volume of blood received in culture bottles   Culture   Final    NO GROWTH 5 DAYS Performed at [facility], [address] [place], [place] [address]    Report Status 10/20/2022 FINAL  Final  Body fluid culture w Gram Stain     Status: None   Collection Time: 10/17/22  2:02 PM   Specimen: Synovium; Body Fluid  Result Value Ref Range Status   Specimen Description   Final    SYNOVIAL Performed at [facility], [address] [place], [place] [address]    Special Requests   Final    Normal Performed at [facility], [address] [place], [place] [address]    Gram Stain   Final    FEW WBC PRESENT,BOTH PMN AND MONONUCLEAR NO ORGANISMS SEEN    Culture   Final    NO GROWTH 3 DAYS Performed at [facility], [address] [place], [place] [address]    Report Status 10/21/2022 FINAL  Final    Coagulation Studies: No results for input(s): "LABPROT", "INR" in the last 72 hours.   Urinalysis: No results  for input(s): "COLORURINE", "LABSPEC", "PHURINE", "GLUCOSEU", "HGBUR", "BILIRUBINUR", "KETONESUR", "PROTEINUR", "UROBILINOGEN", "NITRITE", "LEUKOCYTESUR" in the last 72 hours.  Invalid input(s): "APPERANCEUR"     Imaging: No results found.   Medications:      vitamin C  500 mg Oral BID   Chlorhexidine Gluconate Cloth  6 each Topical Daily   Chlorhexidine Gluconate Cloth  6 each Topical Q0600   cyanocobalamin  1,000 mcg Oral Daily   feeding supplement  237 mL Oral TID BM   folic acid  1 mg Oral Daily   heparin injection (subcutaneous)  5,000 Units Subcutaneous Q8H   multivitamin with minerals  1 tablet Oral Daily   nicotine  14 mg Transdermal Daily   thiamine  100 mg Oral Daily   Or   thiamine  100 mg Intravenous Daily   Vitamin D (Ergocalciferol)  50,000 Units Oral Q7 days   acetaminophen, alteplase, alteplase, chlorproMAZINE, heparin, melatonin, morphine injection, ondansetron **OR** ondansetron (ZOFRAN) IV, mouth rinse, polyethylene glycol, prochlorperazine, traMADol, traZODone  Assessment/ Plan:  72 y.o. male with  tobacco use, alcohol abuse, osteoarthritis status post left knee replacement and hyperlipidemia, frequent falls over the past few weeks since July 4 th admitted on 10/13/2022 for AKI (acute kidney injury) (HCC) [N17.9]   Acute Kidney Injury on chronic kidney disease stage IIIB   Baseline creatinine of 1.74, GFR of 41 on 08/18/22 Suspect patient's acute kidney injury secondary to NSAID induced nephropathy and concurrent sepsis. No history of diabetes or hypertension per patient. Renal ultrasound with no obstruction. No IV contrast. -Underwent first hemodialysis treatment on 10/15/2022.   -Received dialysis yesterday, no UF due to increased urine output.  - Appreciate nursing removing HD temp cath - Strict intake and output ordered  - Will hold dialysis this weekend and monitor for renal recovery.    Sepsis Urinary source-urine culture from 10/14/2022 is growing  Klebsiella. Completed iv Rocephin and Vancomycin  Hyperkalemia Potassium 5.0 today. Will monitor for need of Lokelma   LOS: 9   9/7/202412:50 PM  [address] [place], [place] [phone]

## 2022-10-22 NOTE — Plan of Care (Signed)

## 2022-10-22 NOTE — Plan of Care (Signed)

## 2022-10-23 DIAGNOSIS — S93492A Sprain of other ligament of left ankle, initial encounter: Secondary | ICD-10-CM

## 2022-10-23 DIAGNOSIS — J9601 Acute respiratory failure with hypoxia: Secondary | ICD-10-CM | POA: Diagnosis not present

## 2022-10-23 DIAGNOSIS — A419 Sepsis, unspecified organism: Secondary | ICD-10-CM | POA: Diagnosis not present

## 2022-10-23 DIAGNOSIS — S93402A Sprain of unspecified ligament of left ankle, initial encounter: Secondary | ICD-10-CM | POA: Diagnosis not present

## 2022-10-23 DIAGNOSIS — N179 Acute kidney failure, unspecified: Secondary | ICD-10-CM | POA: Diagnosis not present

## 2022-10-23 DIAGNOSIS — D72825 Bandemia: Secondary | ICD-10-CM | POA: Diagnosis not present

## 2022-10-23 LAB — CBC WITH DIFFERENTIAL/PLATELET
Abs Immature Granulocytes: 0.53 10*3/uL — ABNORMAL HIGH (ref 0.00–0.07)
Basophils Absolute: 0.1 10*3/uL (ref 0.0–0.1)
Basophils Relative: 0 %
Eosinophils Absolute: 0.1 10*3/uL (ref 0.0–0.5)
Eosinophils Relative: 0 %
HCT: 31.3 % — ABNORMAL LOW (ref 39.0–52.0)
Hemoglobin: 10.4 g/dL — ABNORMAL LOW (ref 13.0–17.0)
Immature Granulocytes: 2 %
Lymphocytes Relative: 7 %
Lymphs Abs: 2.6 10*3/uL (ref 0.7–4.0)
MCH: 29.6 pg (ref 26.0–34.0)
MCHC: 33.2 g/dL (ref 30.0–36.0)
MCV: 89.2 fL (ref 80.0–100.0)
Monocytes Absolute: 2.8 10*3/uL — ABNORMAL HIGH (ref 0.1–1.0)
Monocytes Relative: 8 %
Neutro Abs: 29.1 10*3/uL — ABNORMAL HIGH (ref 1.7–7.7)
Neutrophils Relative %: 83 %
Platelets: 560 10*3/uL — ABNORMAL HIGH (ref 150–400)
RBC: 3.51 MIL/uL — ABNORMAL LOW (ref 4.22–5.81)
RDW: 16.2 % — ABNORMAL HIGH (ref 11.5–15.5)
Smear Review: NORMAL
WBC: 35.3 10*3/uL — ABNORMAL HIGH (ref 4.0–10.5)
nRBC: 0 % (ref 0.0–0.2)

## 2022-10-23 LAB — RENAL FUNCTION PANEL
Albumin: 2.3 g/dL — ABNORMAL LOW (ref 3.5–5.0)
Anion gap: 10 (ref 5–15)
BUN: 77 mg/dL — ABNORMAL HIGH (ref 8–23)
CO2: 25 mmol/L (ref 22–32)
Calcium: 8.1 mg/dL — ABNORMAL LOW (ref 8.9–10.3)
Chloride: 101 mmol/L (ref 98–111)
Creatinine, Ser: 3.38 mg/dL — ABNORMAL HIGH (ref 0.61–1.24)
GFR, Estimated: 18 mL/min — ABNORMAL LOW (ref >60)
Glucose, Bld: 93 mg/dL (ref 70–99)
Phosphorus: 5.8 mg/dL — ABNORMAL HIGH (ref 2.5–4.6)
Potassium: 5.2 mmol/L — ABNORMAL HIGH (ref 3.5–5.1)
Sodium: 136 mmol/L (ref 135–145)

## 2022-10-23 LAB — GLUCOSE, CAPILLARY: Glucose-Capillary: 110 mg/dL — ABNORMAL HIGH (ref 70–99)

## 2022-10-23 MED ORDER — SODIUM ZIRCONIUM CYCLOSILICATE 10 G PO PACK
10.0000 g | PACK | Freq: Every day | ORAL | Status: DC
  Administered 2022-10-23: 10 g via ORAL
  Filled 2022-10-23 (×2): qty 1

## 2022-10-23 NOTE — Progress Notes (Signed)
Physical Therapy Treatment Patient Details Name: Joshua Mora MRN: [id number] DOB: [birth year] Today's Date: 10/23/2022   History of Present Illness Pt is a 72 year old presenting to the ED with progressive weakness and frequent falls; admitted with AKI, leukocytosis  PMH significant for osteoarthritis s/p left knee replacement. Pt found to have L fibular head fx. Now with multiple L ankle ligament injuries for which CAM boot is recommended with mobility    PT Comments  Patient is agreeable to PT. CAM boot in the room, however patient requesting not to put it on at this time. Patient required Max A for BLE movement towards edge of bed and then declined to attempt sitting up further due to pain. Max A for rolling in bed for repositioning and pillow placement for comfort and to maintain skin integrity. Therapeutic exercises performed on RLE. Pain with movement in L knee and L ankle reported. Recommend to continue PT to maximize independence and decrease caregiver burden.    If plan is discharge home, recommend the following: Two people to help with walking and/or transfers;Two people to help with bathing/dressing/bathroom;Help with stairs or ramp for entrance;Assist for transportation   Can travel by private vehicle     No  Equipment Recommendations   (to be determined at next level of care)    Recommendations for Other Services       Precautions / Restrictions Precautions Precautions: Fall Precaution Comments: has L knee brace from home (was not in the room today) Required Braces or Orthoses: Other Brace (CAM walker LLE) Other Brace: CAM walker off while in bed, prevalons in bed Restrictions Weight Bearing Restrictions: Yes LLE Weight Bearing: Weight bearing as tolerated Other Position/Activity Restrictions: with CAM walker in place     Mobility  Bed Mobility Overal bed mobility: Needs Assistance Bed Mobility: Rolling, Supine to Sit Rolling: Max assist   Supine to sit: Max  assist (for partial sitting)     General bed mobility comments: assisted BLE to edge of bed with maximal assistance in preparation for attempting to sit up, however patient declined going further at this time. unable to sit fully upright today, limited by pain and fatigue with activity.    Transfers                        Ambulation/Gait                   Stairs             Wheelchair Mobility     Tilt Bed    Modified Rankin (Stroke Patients Only)       Balance                                            Cognition Arousal: Alert Behavior During Therapy: Flat affect Overall Cognitive Status: Within Functional Limits for tasks assessed                                 General Comments: patient is able to follow commands with increased time        Exercises Total Joint Exercises Ankle Circles/Pumps: AROM, Strengthening, Right, 10 reps, Supine Heel Slides: AAROM, Strengthening, Right, 10 reps, Supine Hip ABduction/ADduction: AROM, Strengthening, Right, 10 reps, Supine Other Exercises Other Exercises: verbal  cues for exercise technique    General Comments        Pertinent Vitals/Pain Pain Assessment Pain Assessment: Faces Faces Pain Scale: Hurts even more Pain Location: L ankle, lower part of L knee Pain Descriptors / Indicators: Discomfort Pain Intervention(s): Monitored during session, Limited activity within patient's tolerance, Repositioned, RN gave pain meds during session    Home Living                          Prior Function            PT Goals (current goals can now be found in the care plan section) Acute Rehab PT Goals PT Goal Formulation: With patient Time For Goal Achievement: 10/29/22 Potential to Achieve Goals: Fair Progress towards PT goals: Progressing toward goals    Frequency    Min 1X/week      PT Plan      Co-evaluation              AM-PAC PT "6  Clicks" Mobility   Outcome Measure  Help needed turning from your back to your side while in a flat bed without using bedrails?: A Lot Help needed moving from lying on your back to sitting on the side of a flat bed without using bedrails?: Total Help needed moving to and from a bed to a chair (including a wheelchair)?: Total Help needed standing up from a chair using your arms (e.g., wheelchair or bedside chair)?: Total Help needed to walk in hospital room?: Total Help needed climbing 3-5 steps with a railing? : Total 6 Click Score: 7    End of Session   Activity Tolerance: Patient limited by fatigue;Patient limited by pain Patient left: in bed;with call bell/phone within reach;with bed alarm set;with family/visitor present (prevlone boots in place bilaterally) Nurse Communication: Mobility status PT Visit Diagnosis: Muscle weakness (generalized) (M62.81);Repeated falls (R29.6);Difficulty in walking, not elsewhere classified (R26.2)     Time: 5188-4166 PT Time Calculation (min) (ACUTE ONLY): 20 min  Charges:    $Therapeutic Exercise: 8-22 mins PT General Charges $$ ACUTE PT VISIT: 1 Visit                     Donna Bernard, PT, MPT    Ina Homes 10/23/2022, 3:06 PM

## 2022-10-23 NOTE — Consult Note (Signed)
PODIATRY CONSULTATION  NAME Joshua Mora MRN [id number] DOB [birth year] DOA 10/13/2022   Reason for consult:  Chief Complaint  Patient presents with   Weakness    Attending/Consulting physician:Dr. Nelson Chimes MD  History of present illness:From HPI per Dr. Nelson Chimes: 72 y.o. male with medical history significant of alcohol abuse, tobacco use disorder, diet controlled prediabetes, CKD 3B, osteoarthritis s/p left knee replacement who presented to the ED with complaints of progressive weakness and frequent falls over the past 1 month.   Having left ankle pain. XR did not show fracture but having trouble bearing weight. MRI obtained and concern for ligamentous injuries about the left ankle. Per family and patient he was walking and drving normally prior to the falls he sustained, does think he twisted the ankle badly during one fall.   Past Medical History:  Diagnosis Date   Arthritis    Chest pain        Latest Ref Rng & Units 10/23/2022    9:55 AM 10/22/2022    8:34 AM 10/21/2022    3:52 AM  CBC  WBC 4.0 - 10.5 K/uL 35.3  37.9  36.3   Hemoglobin 13.0 - 17.0 g/dL 16.6  06.3  01.6   Hematocrit 39.0 - 52.0 % 31.3  30.9  32.1   Platelets 150 - 400 K/uL 560  492  421        Latest Ref Rng & Units 10/23/2022    9:55 AM 10/22/2022    8:34 AM 10/21/2022    3:52 AM  BMP  Glucose 70 - 99 mg/dL 93  010  932   BUN 8 - 23 mg/dL 77  55  355   Creatinine 0.61 - 1.24 mg/dL 7.32  2.02  5.42   Sodium 135 - 145 mmol/L 136  138  140   Potassium 3.5 - 5.1 mmol/L 5.2  5.0  5.7   Chloride 98 - 111 mmol/L 101  99  105   CO2 22 - 32 mmol/L 25  28  22    Calcium 8.9 - 10.3 mg/dL 8.1  8.2  9.0       Physical Exam: Lower Extremity Exam Vasc: R - PT palpable, DP palpable. Cap refill < 3 sec to digits  L - PT palpable, DP palpable. Cap refill <3 sec to digits  Derm: R - Normal temp/texture/turgor with no open lesion or clinical signs of infection  L - Ecchymosis noted ankle and foot no open wound  MSK:   R -  No gross deformities. Compartments soft, non-tender, compressible  L -  Edema noted LLE pain on palpation about entire ankle  Neuro: R - Gross sensation intact. Gross motor function intact   L - Gross sensation intact. Gross motor function intact though limited to pain/edema.     ASSESSMENT/PLAN OF CARE 72 y.o. male with PMHx significant for  alcohol abuse, tobacco use disorder, diet controlled prediabetes, CKD 3B, osteoarthritis s/p left knee replacement  with Grade 3 left ankle sprain with ATFL rupture as well as superior peroneal retinaculum rupture 2/2 mechanical falls int past month.   - Will plan for conservative management of the above injuries. Recommend CAM boot immobilization of LLE for next 4-6 weeks.  - Patient is able to WBAT to the LLE in CAM boot but while in bed, prefer pt to be in prevalon boot to prevent pressure injury to heals. CAM boot when mobilizing with PT. - Ace Wrap applied to LLE - re wrap every 1-2  days for edema control Ice PRN - Anticoagulation: per primary - Wound care: NA - WB status: WBAT to LLE in CAM boot - patient can follow up in office 2-4 weeks after discharge for recheck of these injuries.  - Podiatry to sign off at this time, please contact us for questions/concerns.    Thank you for the consult.  Please contact me directly with any questions or concerns.           Corinna Gab, DPM [facility]    [address] [place], [place] [address]                Office [phone]  Fax [phone]

## 2022-10-23 NOTE — Progress Notes (Signed)
Progress Note   Patient: Joshua Mora ZOX:[id number] DOB: [birth year] DOA: 10/13/2022     10 DOS: the patient was seen and examined on 10/23/2022   Brief hospital course: Taken from prior notes.  Joshua Mora is a 72 y.o. male with medical history significant of alcohol abuse, tobacco use disorder, diet controlled prediabetes, CKD 3B, osteoarthritis s/p left knee replacement who presented to the ED with complaints of progressive weakness and frequent falls over the past 1 month.     On presentation he was found to have severely elevated creatinine above baseline for which nephrology was consulted.  Also with significant leukocytosis above 46,000 with concern for leukemoid reaction in the setting of presumptive UTI.  Trauma imaging were nonacute.  CT head also nonacute.  Hospital course was complicated by acute hypoxic respiratory failure.  Was found to have mild pulmonary edema on chest x-ray.  Elevated D-dimer above 11 with concern for possible pulmonary embolism.  Heparin drip initiated.  9/1: Overnight became febrile at 100.7.  Urine cultures with Klebsiella pneumonia which are sensitive to ceftriaxone.  Had bilateral knee injuries which does not appear infected.  Worsening leukocytosis.  VQ scan negative for PE, lower extremity venous Doppler negative for DVT, discontinuing heparin infusion.  Adding vancomycin to broaden up the coverage.  Also concern of Leukomed reaction versus any underlying undiagnosed leukemia-hematology was consulted. Also ordered labs to rule out DIC due to worsening D-dimer.  9/2: Some improvement in leukocytosis, hematology ordered few more labs, if he continued to have persistently elevated white cell count then he might need bone marrow for further evaluation. Also consulted orthopedic surgery to get their opinion on this recent left nondisplaced fibular neck fracture, slight worsening of creatinine but good UOP.  Nephrology would like to wait before  proceeding for another dialysis.  9/3: Renal function seems stable.  Preliminary left knee aspirate with no growth.  Improving leukocytosis.  ID was also consulted.  Can transfer to progressive now.  9/4: Slowly improving creatinine but patient remained elevated.  Nephrology to decide again tomorrow if he need another dialysis.  Improving leukocytosis, elevated kappa and lambda with normal ratio, rest of the labs pending.  ID is now on board and likely switch to p.o. antibiotics soon.  9/5: Some worsening confusion overnight with poor p.o. intake.  Renal function seems stable but GFR remains at 13-going for another dialysis today.  9/6: Vitals and renal function stable, unable to do HD yesterday due to mechanical issues with machine, plan to do hemodialysis today at [facility]  Potassium 5.7-Lokelma ordered.  Worsening leukocytosis, message sent to hematology. Ordered left ankle MRI due to worsening pain and swelling, x-ray was negative. Antibiotics were stopped yesterday.  9/7: Still feeling weak, renal function improved after getting dialysis yesterday, temperature catheter was removed and he will be monitor over the weekend.  Still oliguric.  Worsening leukocytosis.  Likely will get bone marrow biopsy on Monday-hematology placed orders. MRI of left ankle with complete tear of talofibular ligament and multiple other ligamental injuries-podiatry was consulted  Assessment and Plan: * Acute renal failure superimposed on stage 3b chronic kidney disease (HCC) Patient with poor p.o. intake, BUN of 72 and creatinine of 6.30 on admission, it was 1.74 early July when he was first seen with fall. Renal ultrasound negative for any obstruction, noted an ill-defined echogenic area in the right upper pole of kidney which can be followed up later on with designated CT scan. Patient was also using NSAID  regularly. -Nephrology consult-patient received first dialysis on 10/15/2022, Patient received total of 2  dialysis so far.  Last 1 was on 10/21/2022 Temporary catheter was removed and nephrology will observe over the weekend -Avoid nephrotoxins  Severe sepsis [place]) Patient met sepsis criteria with tachycardia, severe leukocytosis and severe sepsis with lactic acidosis above 4.3.  Initial procalcitonin was 60.67 which is decreasing. Did develop a low-grade fever overnight again.  Procalcitonin improving.  Preliminary blood cultures negative, urine cultures with Klebsiella pneumonia which shows only resistance to ampicillin and Augmentin.  Patient did received ceftriaxone for the past 3 days.  Vancomycin was added on 10/16/2022.  Left knee aspirate with no growth so far. -ID consult -Completed the course of antibiotic and ID is advising monitoring without antibiotic for now. -Continue to monitor  Leukocytosis Seems stable today, pathologist smear review with immature granulocytes, initial smear review with bandemia.  Urine cultures with Klebsiella pneumonia but patient is being treated with appropriate antibiotics. Significantly elevated D-dimer which continued to get worse.  VTE has been ruled out with negative VQ scan and lower extremity venous Doppler. -Completed the course of antibiotic -Hematology consult-ordered some more labs, likely leukemoid reaction with severe inflammation and infection but if remain elevated he will need bone marrow biopsy for further evaluation -Bone marrow biopsy likely on Monday  Elevated d-dimer Patient with worsening D-dimer, now at 13.  COVID was negative. DVT has been ruled out with negative VQ scan and lower extremity venous Doppler. -Stop heparin infusion -Checking labs for DIC -Continue to monitor  Acute hypoxemic respiratory failure (HCC) Patient did develop acute hypoxic respiratory failure and repeat chest x-ray at that time with concern of pulmonary congestion.  Echocardiogram with normal EF and grade 1 diastolic dysfunction.  VQ scan negative for  PE Patient with significant AKI, also concern of NSAID induced renal injury. Started on dialysis. -Continue IV Lasix -Continue supplemental oxygen-wean as tolerated  Frequent falls Patient with history of recent decline, poor appetite and multiple falls.  First fall was on July 4 and since then he was falling couple of times a week.  Left knee imaging done on 8/23 with a concern of nondisplaced fibular neck fracture, Initial plan was outpatient follow-up with orthopedic surgery. Requested orthopedic surgery to evaluate while he is in hospital B12 normal, significantly low vitamin D-started on supplement. PT/OT are recommending SNF  Nicotine dependence, uncomplicated -Nicotine patch  Traumatic rhabdomyolysis (HCC) Likely secondary to recurrent falls.  Left ankle pain Patient continued to have worsening pain and edema of left ankle prompted an MRI of left ankle as initial imaging was negative for any fracture. MRI with complete tear of talofibular ligament and multiple other ligamental injuries. -Podiatry was consulted-recommending conservative management with pain control and CAM boot for 4 to 6 weeks and outpatient follow-up -Continue with pain management      Subjective: Patient was more alert and interactive today.  Eating breakfast.  Improved left ankle pain by placing that in boot and stabilizing it.  Physical Exam: Vitals:   10/22/22 0851 10/22/22 1538 10/23/22 0014 10/23/22 0906  BP: 122/60 (!) 142/56 (!) 122/56 (!) 119/52  Pulse: 73 97 93 85  Resp: 18 16 18 16   Temp: 98.1 F (36.7 C) 98 F (36.7 C) 98.5 F (36.9 C) 98.2 F (36.8 C)  TempSrc: Oral     SpO2:  98% 95% 96%  Weight:      Height:       General.  Frail elderly man, in no acute  distress. Pulmonary.  Lungs clear bilaterally, normal respiratory effort. CV.  Regular rate and rhythm, no JVD, rub or murmur. Abdomen.  Soft, nontender, nondistended, BS positive. CNS.  Alert and oriented .  No focal  neurologic deficit. Extremities.  No edema, no cyanosis, pulses intact and symmetrical.  Left lower extremity with boot Psychiatry.  Judgment and insight appears normal.   Data Reviewed: Prior data reviewed  Family Communication: Discussed with daughter at bedside  Disposition: Status is: Inpatient Remains inpatient appropriate because: Severity of illness  Planned Discharge Destination: Skilled nursing facility  DVT prophylaxis.  Subcu heparin Time spent: 44 minutes  This record has been created using Conservation officer, historic buildings. Errors have been sought and corrected,but may not always be located. Such creation errors do not reflect on the standard of care.   Author: Arnetha Courser, MD 10/23/2022 1:13 PM  For on call review [email or web address].

## 2022-10-23 NOTE — Plan of Care (Signed)

## 2022-10-23 NOTE — Progress Notes (Signed)
[facility], [place] 10/23/22  Subjective:   Hospital day # 10  Patient seen resting quietly  Daughter at bedside 3L [place]    Renal: 09/07 0701 - 09/08 0700 In: 460 [P.O.:460] Out: 1000 [Urine:1000] Lab Results  Component Value Date   CREATININE 2.88 (H) 10/22/2022   CREATININE 4.28 (H) 10/21/2022   CREATININE 4.50 (H) 10/20/2022     Objective:  Vital signs in last 24 hours:  Temp:  [98 F (36.7 C)-98.5 F (36.9 C)] 98.2 F (36.8 C) (09/08 0906) Pulse Rate:  [85-97] 85 (09/08 0906) Resp:  [16-18] 16 (09/08 0906) BP: (119-142)/(52-56) 119/52 (09/08 0906) SpO2:  [95 %-98 %] 96 % (09/08 0906)  Weight change:  Filed Weights   10/21/22 0500 10/21/22 1453 10/21/22 1810  Weight: 89 kg 82.6 kg 82.6 kg    Intake/Output:    Intake/Output Summary (Last 24 hours) at 10/23/2022 1011 Last data filed at 10/23/2022 0523 Gross per 24 hour  Intake 460 ml  Output 750 ml  Net -290 ml     Physical Exam: General: Critically ill-appearing  HEENT Moist oral mucous membranes  Pulm/lungs [place] O2  CVS/Heart Irregular  Abdomen:  Soft, mildly distended, nontender  Extremities: Dependent edema present  Neurologic: Alert, responds to simple commands  Skin: Warm, LLE bruising  Access: None       Basic Metabolic Panel:  Recent Labs  Lab 10/17/22 0327 10/18/22 0503 10/19/22 0605 10/20/22 0427 10/21/22 0352 10/22/22 0834  NA 135 135 137 137 140 138  K 4.2 4.6 4.7 5.1 5.7* 5.0  CL 99 100 103 103 105 99  CO2 23 23 22  21* 22 28  GLUCOSE 121* 115* 113* 118* 104* 100*  BUN 87* 91* 105* 103* 101* 55*  CREATININE 4.80* 4.71* 4.47* 4.50* 4.28* 2.88*  CALCIUM 8.3* 8.6* 8.8* 8.8* 9.0 8.2*  MG 2.1  --   --   --   --   --   PHOS 4.7* 5.6* 5.6* 5.9* 6.2* 4.7*     CBC: Recent Labs  Lab 10/17/22 0327 10/18/22 0503 10/19/22 0605 10/20/22 1227 10/21/22 0352 10/22/22 0834  WBC 37.0* 31.5* 29.0* 33.6* 36.3* 37.9*  NEUTROABS 30.6* 24.7* 21.7* 26.9*  --   30.4*  HGB 9.9* 10.0* 10.3* 10.9* 10.9* 10.7*  HCT 29.3* 29.2* 30.0* 30.8* 32.1* 30.9*  MCV 89.3 89.8 88.0 86.5 88.2 86.8  PLT 190 201 258 349 421* 492*      Lab Results  Component Value Date   HEPBSAG NON REACTIVE 10/15/2022   HEPBIGM NON REACTIVE 10/15/2022      Microbiology:  Recent Results (from the past 240 hour(s))  SARS Coronavirus 2 by RT PCR (hospital order, performed in [facility] hospital lab) *cepheid single result test* Anterior Nasal Swab     Status: None   Collection Time: 10/13/22  3:55 PM   Specimen: Anterior Nasal Swab  Result Value Ref Range Status   SARS Coronavirus 2 by RT PCR NEGATIVE NEGATIVE Final    Comment: (NOTE) SARS-CoV-2 target nucleic acids are NOT DETECTED.  The SARS-CoV-2 RNA is generally detectable in upper and lower respiratory specimens during the acute phase of infection. The lowest concentration of SARS-CoV-2 viral copies this assay can detect is 250 copies / mL. A negative result does not preclude SARS-CoV-2 infection and should not be used as the sole basis for treatment or other patient management decisions.  A negative result may occur with improper specimen collection / handling, submission of specimen other than  nasopharyngeal swab, presence of viral mutation(s) within the areas targeted by this assay, and inadequate number of viral copies (<250 copies / mL). A negative result must be combined with clinical observations, patient history, and epidemiological information.  Fact Sheet for Patients:   RoadLapTop.co.za  Fact Sheet for Healthcare Providers: http://kim-miller.com/  This test is not yet approved or  cleared by the [place] FDA and has been authorized for detection and/or diagnosis of SARS-CoV-2 by FDA under an Emergency Use Authorization (EUA).  This EUA will remain in effect (meaning this test can be used) for the duration of the COVID-19 declaration under Section  564(b)(1) of the Act, 21 U.S.C. section 360bbb-3(b)(1), unless the authorization is terminated or revoked sooner.  Performed at [facility], [address] [place], [place] [address]   MRSA Next Gen by PCR, Nasal     Status: None   Collection Time: 10/13/22  9:51 PM   Specimen: Nasal Mucosa; Nasal Swab  Result Value Ref Range Status   MRSA by PCR Next Gen NOT DETECTED NOT DETECTED Final    Comment: (NOTE) The GeneXpert MRSA Assay (FDA approved for NASAL specimens only), is one component of a comprehensive MRSA colonization surveillance program. It is not intended to diagnose MRSA infection nor to guide or monitor treatment for MRSA infections. Test performance is not FDA approved in patients less than 66 years old. Performed at [facility], [address], [place], [place] [address]   Urine Culture     Status: Abnormal   Collection Time: 10/14/22  7:32 AM   Specimen: Urine, Random  Result Value Ref Range Status   Specimen Description   Final    URINE, RANDOM Performed at [facility], [address] [place], [place] [address]    Special Requests   Final    NONE Reflexed from [id number] Performed at [facility], [address] [place], [place] [address]    Culture >=100,000 COLONIES/mL KLEBSIELLA PNEUMONIAE (A)  Final   Report Status 10/16/2022 FINAL  Final   Organism ID, Bacteria KLEBSIELLA PNEUMONIAE (A)  Final      Susceptibility   Klebsiella pneumoniae - MIC*    AMPICILLIN >=32 RESISTANT Resistant     CEFAZOLIN <=4 SENSITIVE Sensitive     CEFEPIME <=0.12 SENSITIVE Sensitive     CEFTRIAXONE <=0.25 SENSITIVE Sensitive     CIPROFLOXACIN <=0.25 SENSITIVE Sensitive     GENTAMICIN <=1 SENSITIVE Sensitive     IMIPENEM <=0.25 SENSITIVE Sensitive     NITROFURANTOIN 128 RESISTANT Resistant     TRIMETH/SULFA <=20 SENSITIVE Sensitive     AMPICILLIN/SULBACTAM 8 SENSITIVE Sensitive     PIP/TAZO <=4 SENSITIVE Sensitive     * >=100,000  COLONIES/mL KLEBSIELLA PNEUMONIAE  Culture, blood (Routine X 2) w Reflex to ID Panel     Status: None   Collection Time: 10/15/22  1:29 AM   Specimen: Right Antecubital; Blood  Result Value Ref Range Status   Specimen Description RIGHT ANTECUBITAL  Final   Special Requests   Final    BOTTLES DRAWN AEROBIC AND ANAEROBIC Blood Culture adequate volume   Culture   Final    NO GROWTH 5 DAYS Performed at [facility], [address], [place], [place] [address]    Report Status 10/20/2022 FINAL  Final  Culture, blood (Routine X 2) w Reflex to ID Panel     Status: None   Collection Time: 10/15/22 11:41 AM   Specimen: BLOOD  Result Value Ref Range  Status   Specimen Description BLOOD BLOOD RIGHT HAND  Final   Special Requests   Final    BOTTLES DRAWN AEROBIC ONLY Blood Culture results may not be optimal due to an inadequate volume of blood received in culture bottles   Culture   Final    NO GROWTH 5 DAYS Performed at [facility], [address] [place], [place] [address]    Report Status 10/20/2022 FINAL  Final  Body fluid culture w Gram Stain     Status: None   Collection Time: 10/17/22  2:02 PM   Specimen: Synovium; Body Fluid  Result Value Ref Range Status   Specimen Description   Final    SYNOVIAL Performed at [facility], [address] [place], [place] [address]    Special Requests   Final    Normal Performed at [facility], [address] [place], [place] [address]    Gram Stain   Final    FEW WBC PRESENT,BOTH PMN AND MONONUCLEAR NO ORGANISMS SEEN    Culture   Final    NO GROWTH 3 DAYS Performed at [facility], [address] [place], [place] [address]    Report Status 10/21/2022 FINAL  Final    Coagulation Studies: No results for input(s): "LABPROT", "INR" in the last 72 hours.   Urinalysis: No results for input(s): "COLORURINE", "LABSPEC", "PHURINE", "GLUCOSEU", "HGBUR", "BILIRUBINUR", "KETONESUR", "PROTEINUR",  "UROBILINOGEN", "NITRITE", "LEUKOCYTESUR" in the last 72 hours.  Invalid input(s): "APPERANCEUR"     Imaging: MR ANKLE LEFT WO CONTRAST  Result Date: 10/22/2022 CLINICAL DATA:  Left ankle pain after multiple falls EXAM: MRI OF THE LEFT ANKLE WITHOUT CONTRAST TECHNIQUE: Multiplanar, multisequence MR imaging of the ankle was performed. No intravenous contrast was administered. COMPARISON:  X-ray 10/13/2022 FINDINGS: Technical Note: Despite efforts by the technologist and patient, motion artifact is present on today's exam and could not be eliminated. This reduces exam sensitivity and specificity. TENDONS Peroneal: Peroneus longus and brevis tendons are intact. The superior peroneal retinaculum is torn (series 6, image 17). Slight medial subluxation of the peroneal tendons without dislocation. Posteromedial: Tibialis posterior, flexor hallucis longus, and flexor digitorum longus tendons are intact and normally positioned. Anterior: Tibialis anterior, extensor hallucis longus, and extensor digitorum longus tendons are intact and normally positioned. Achilles: Distal Achilles tendinosis with tiny partial-thickness insertional tear of the central tendon (series 8, image 13). No full-thickness or retracted tear. No retrocalcaneal bursal fluid. Plantar Fascia: Intact. LIGAMENTS Lateral: Intact tibiofibular ligaments. Complete tear of the anterior talofibular ligament. The posterior talofibular and calcaneofibular ligaments are thickened and heterogeneous compatible with high-grade sprains. Medial: Deltoid and spring ligaments are severely degenerated, likely sequela of remote trauma. CARTILAGE AND BONES Ankle Joint: Diffuse cartilage thinning, most severely at the medial talar shoulder and within the medial gutter. 6 mm intra-articular loose body adjacent to the lateral talar shoulder anteriorly. There is varus tilt of the talus. No dislocation. Small tibiotalar joint effusion. Subtalar Joints/Sinus Tarsi: No  cartilage defect. No effusion. Preservation of the anatomic fat within the sinus tarsi. Bones: No acute fracture or dislocation. Mild marrow edema within the lateral malleolus. Mild pes planovalgus alignment. Other: Elongated fluid collection the lateral aspect of the lower leg and ankle measuring approximately 6.7 x 0.8 x 6.1 cm. Soft tissue swelling of the dorsal foot. IMPRESSION: 1. Complete tear of the anterior talofibular ligament. High-grade sprains of the posterior talofibular and calcaneofibular ligaments. 2. Mild marrow edema within the lateral malleolus, which may represent  a bone contusion. No acute fracture. 3. The superior peroneal retinaculum is torn. 4. Elongated fluid collection the lateral aspect of the lower leg and ankle measuring approximately 6.7 x 0.8 x 6.1 cm, likely a hematoma. A fascial degloving/Morel-Lavallee lesion could also have this appearance. 5. 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 6. Distal Achilles tendinosis with tiny partial-thickness insertional tear of the central tendon. No full-thickness or retracted tear. Electronically Signed   By: Duanne Guess D.O.   On: 10/22/2022 13:30     Medications:      vitamin C  500 mg Oral BID   Chlorhexidine Gluconate Cloth  6 each Topical Daily   Chlorhexidine Gluconate Cloth  6 each Topical Q0600   cyanocobalamin  1,000 mcg Oral Daily   feeding supplement  237 mL Oral TID BM   folic acid  1 mg Oral Daily   heparin injection (subcutaneous)  5,000 Units Subcutaneous Q8H   multivitamin with minerals  1 tablet Oral Daily   nicotine  14 mg Transdermal Daily   thiamine  100 mg Oral Daily   Or   thiamine  100 mg Intravenous Daily   Vitamin D (Ergocalciferol)  50,000 Units Oral Q7 days   acetaminophen, alteplase, alteplase, chlorproMAZINE, heparin, melatonin, morphine injection, ondansetron **OR** ondansetron (ZOFRAN) IV, mouth rinse, polyethylene glycol,  prochlorperazine, traMADol, traZODone  Assessment/ Plan:  72 y.o. male with  tobacco use, alcohol abuse, osteoarthritis status post left knee replacement and hyperlipidemia, frequent falls over the past few weeks since July 4 th admitted on 10/13/2022 for AKI (acute kidney injury) (HCC) [N17.9]   Acute Kidney Injury on chronic kidney disease stage IIIB   Baseline creatinine of 1.74, GFR of 41 on 08/18/22 Suspect patient's acute kidney injury secondary to NSAID induced nephropathy and concurrent sepsis. No history of diabetes or hypertension per patient. Renal ultrasound with no obstruction. No IV contrast. -Underwent first hemodialysis treatment on 10/15/2022.   - Appreciate nursing removing HD temp cath - Strict intake and output ordered  - Labs stable today, questionable urine output - Will continue to monitor for renal recovery.   Sepsis Urinary source-urine culture from 10/14/2022 is growing Klebsiella. Completed iv Rocephin and Vancomycin  Hyperkalemia Corrected at this time   LOS: 10 Wendee Beavers 9/8/202410:11 AM  [facility] [place], [place] [phone]

## 2022-10-23 NOTE — Plan of Care (Signed)
  Problem: Health Behavior/Discharge Planning: Goal: Ability to manage health-related needs will improve Outcome: Progressing   Problem: Clinical Measurements: Goal: Diagnostic test results will improve Outcome: Progressing Goal: Respiratory complications will improve Outcome: Progressing   Problem: Activity: Goal: Risk for activity intolerance will decrease Outcome: Progressing   Problem: Elimination: Goal: Will not experience complications related to bowel motility Outcome: Progressing Goal: Will not experience complications related to urinary retention Outcome: Progressing   Problem: Pain Managment: Goal: General experience of comfort will improve Outcome: Progressing

## 2022-10-24 DIAGNOSIS — D72825 Bandemia: Secondary | ICD-10-CM | POA: Diagnosis not present

## 2022-10-24 DIAGNOSIS — E875 Hyperkalemia: Secondary | ICD-10-CM | POA: Insufficient documentation

## 2022-10-24 DIAGNOSIS — A419 Sepsis, unspecified organism: Secondary | ICD-10-CM | POA: Diagnosis not present

## 2022-10-24 DIAGNOSIS — J9601 Acute respiratory failure with hypoxia: Secondary | ICD-10-CM | POA: Diagnosis not present

## 2022-10-24 DIAGNOSIS — N179 Acute kidney failure, unspecified: Secondary | ICD-10-CM | POA: Diagnosis not present

## 2022-10-24 LAB — RENAL FUNCTION PANEL
Albumin: 2.3 g/dL — ABNORMAL LOW (ref 3.5–5.0)
Anion gap: 13 (ref 5–15)
BUN: 88 mg/dL — ABNORMAL HIGH (ref 8–23)
CO2: 24 mmol/L (ref 22–32)
Calcium: 8.3 mg/dL — ABNORMAL LOW (ref 8.9–10.3)
Chloride: 99 mmol/L (ref 98–111)
Creatinine, Ser: 3.73 mg/dL — ABNORMAL HIGH (ref 0.61–1.24)
GFR, Estimated: 16 mL/min — ABNORMAL LOW (ref >60)
Glucose, Bld: 120 mg/dL — ABNORMAL HIGH (ref 70–99)
Phosphorus: 6.8 mg/dL — ABNORMAL HIGH (ref 2.5–4.6)
Potassium: 5.5 mmol/L — ABNORMAL HIGH (ref 3.5–5.1)
Sodium: 136 mmol/L (ref 135–145)

## 2022-10-24 LAB — GLUCOSE, CAPILLARY: Glucose-Capillary: 120 mg/dL — ABNORMAL HIGH (ref 70–99)

## 2022-10-24 LAB — CBC WITH DIFFERENTIAL/PLATELET
Abs Immature Granulocytes: 0.41 10*3/uL — ABNORMAL HIGH (ref 0.00–0.07)
Basophils Absolute: 0.1 10*3/uL (ref 0.0–0.1)
Basophils Relative: 0 %
Eosinophils Absolute: 0.2 10*3/uL (ref 0.0–0.5)
Eosinophils Relative: 1 %
HCT: 29.8 % — ABNORMAL LOW (ref 39.0–52.0)
Hemoglobin: 10.2 g/dL — ABNORMAL LOW (ref 13.0–17.0)
Immature Granulocytes: 1 %
Lymphocytes Relative: 8 %
Lymphs Abs: 2.5 10*3/uL (ref 0.7–4.0)
MCH: 30.2 pg (ref 26.0–34.0)
MCHC: 34.2 g/dL (ref 30.0–36.0)
MCV: 88.2 fL (ref 80.0–100.0)
Monocytes Absolute: 2.9 10*3/uL — ABNORMAL HIGH (ref 0.1–1.0)
Monocytes Relative: 10 %
Neutro Abs: 23.8 10*3/uL — ABNORMAL HIGH (ref 1.7–7.7)
Neutrophils Relative %: 80 %
Platelets: 597 10*3/uL — ABNORMAL HIGH (ref 150–400)
RBC: 3.38 MIL/uL — ABNORMAL LOW (ref 4.22–5.81)
RDW: 16.6 % — ABNORMAL HIGH (ref 11.5–15.5)
Smear Review: NORMAL
WBC: 29.8 10*3/uL — ABNORMAL HIGH (ref 4.0–10.5)
nRBC: 0 % (ref 0.0–0.2)

## 2022-10-24 MED ORDER — PATIROMER SORBITEX CALCIUM 8.4 G PO PACK
16.8000 g | PACK | Freq: Every day | ORAL | Status: DC
  Administered 2022-10-24 – 2022-10-28 (×4): 16.8 g via ORAL
  Filled 2022-10-24 (×6): qty 2

## 2022-10-24 NOTE — Plan of Care (Signed)

## 2022-10-24 NOTE — Progress Notes (Signed)
Occupational Therapy Treatment Patient Details Name: Joshua Mora MRN: [id number] DOB: [birth year] Today's Date: 10/24/2022   History of present illness Pt is a 72 year old presenting to the ED with progressive weakness and frequent falls; admitted with AKI, leukocytosis  PMH significant for osteoarthritis s/p left knee replacement. Pt found to have L fibular head fx. Now with multiple L ankle ligament injuries for which CAM boot is recommended with mobility   OT comments  Pt seen for skilled co-treatment with PT and pt is agreeable with encouragement. Pt needing total A to don/doff prevalon boots and then CAM boot onto L LE this session. Pt tolerates boot during session. Total A of 2 for bed mobility to sit on EOB. Pt needing max A initially for static sitting progressing to min A with R lateral and posterior lean during session. Pt declines to attempt stand or functional transfers this session. Total A of 2 for repositioning to return to supine. Call bell and all needed items within reach upon exiting the room. Family remaining in room at end of session.       If plan is discharge home, recommend the following:  A lot of help with walking and/or transfers;A lot of help with bathing/dressing/bathroom;Supervision due to cognitive status;Assist for transportation;Direct supervision/assist for medications management;Direct supervision/assist for financial management;Help with stairs or ramp for entrance   Equipment Recommendations  Other (comment) (defer to next venue of care)       Precautions / Restrictions Precautions Precautions: Fall Required Braces or Orthoses: Other Brace Other Brace: CAM walker off while in bed, prevalons in bed Restrictions Weight Bearing Restrictions: Yes LLE Weight Bearing: Weight bearing as tolerated Other Position/Activity Restrictions: with CAM walker in place       Mobility Bed Mobility Overal bed mobility: Needs Assistance Bed Mobility: Supine to  Sit, Sit to Supine     Supine to sit: Total assist, +2 for physical assistance Sit to supine: Total assist, +2 for physical assistance        Transfers                   General transfer comment: pt declined attempts     Balance Overall balance assessment: Needs assistance Sitting-balance support: Bilateral upper extremity supported, Feet unsupported Sitting balance-Leahy Scale: Poor   Postural control: Posterior lean, Right lateral lean                                 ADL either performed or assessed with clinical judgement   ADL                                         General ADL Comments: Pt declined self care tasks while seated on EOB. Total A to don/doff prevalon boot and don/doff CAM boot.    Extremity/Trunk Assessment Upper Extremity Assessment Upper Extremity Assessment: Generalized weakness   Lower Extremity Assessment Lower Extremity Assessment: Generalized weakness        Vision Patient Visual Report: No change from baseline                             Pertinent Vitals/ Pain       Pain Assessment Pain Assessment: 0-10 Pain Score: 10-Worst pain ever Pain Location: L ankle, lower part of  L knee Pain Descriptors / Indicators: Discomfort, Grimacing, Guarding, Aching, Moaning Pain Intervention(s): Limited activity within patient's tolerance, Monitored during session, Premedicated before session, Repositioned         Frequency  Min 1X/week        Progress Toward Goals  OT Goals(current goals can now be found in the care plan section)  Progress towards OT goals: Progressing toward goals      AM-PAC OT "6 Clicks" Daily Activity     Outcome Measure   Help from another person eating meals?: A Little Help from another person taking care of personal grooming?: A Little Help from another person toileting, which includes using toliet, bedpan, or urinal?: Total Help from another person bathing  (including washing, rinsing, drying)?: Total Help from another person to put on and taking off regular upper body clothing?: A Lot Help from another person to put on and taking off regular lower body clothing?: A Lot 6 Click Score: 12    End of Session Equipment Utilized During Treatment: Oxygen  OT Visit Diagnosis: Other abnormalities of gait and mobility (R26.89);Muscle weakness (generalized) (M62.81)   Activity Tolerance Patient limited by fatigue;Patient limited by pain   Patient Left in bed;with call bell/phone within reach;with family/visitor present   Nurse Communication Mobility status        Time: 1410-1433 OT Time Calculation (min): 23 min  Charges: OT General Charges $OT Visit: 1 Visit OT Treatments $Therapeutic Activity: 8-22 mins  Jackquline Denmark, MS, OTR/L , CBIS ascom [phone]  10/24/22, 3:21 PM

## 2022-10-24 NOTE — Progress Notes (Signed)
Progress Note   Patient: Joshua Mora BSJ:[id number] DOB: 12-06-1950 DOA: 10/13/2022     11 DOS: the patient was seen and examined on 10/24/2022   Brief hospital course: Taken from prior notes.  Tayseer Barile is a 72 y.o. male with medical history significant of alcohol abuse, tobacco use disorder, diet controlled prediabetes, CKD 3B, osteoarthritis s/p left knee replacement who presented to the ED with complaints of progressive weakness and frequent falls over the past 1 month.     On presentation he was found to have severely elevated creatinine above baseline for which nephrology was consulted.  Also with significant leukocytosis above 46,000 with concern for leukemoid reaction in the setting of presumptive UTI.  Trauma imaging were nonacute.  CT head also nonacute.  Hospital course was complicated by acute hypoxic respiratory failure.  Was found to have mild pulmonary edema on chest x-ray.  Elevated D-dimer above 11 with concern for possible pulmonary embolism.  Heparin drip initiated.  9/1: Overnight became febrile at 100.7.  Urine cultures with Klebsiella pneumonia which are sensitive to ceftriaxone.  Had bilateral knee injuries which does not appear infected.  Worsening leukocytosis.  VQ scan negative for PE, lower extremity venous Doppler negative for DVT, discontinuing heparin infusion.  Adding vancomycin to broaden up the coverage.  Also concern of Leukomed reaction versus any underlying undiagnosed leukemia-hematology was consulted. Also ordered labs to rule out DIC due to worsening D-dimer.  9/2: Some improvement in leukocytosis, hematology ordered few more labs, if he continued to have persistently elevated white cell count then he might need bone marrow for further evaluation. Also consulted orthopedic surgery to get their opinion on this recent left nondisplaced fibular neck fracture, slight worsening of creatinine but good UOP.  Nephrology would like to wait before  proceeding for another dialysis.  9/3: Renal function seems stable.  Preliminary left knee aspirate with no growth.  Improving leukocytosis.  ID was also consulted.  Can transfer to progressive now.  9/4: Slowly improving creatinine but patient remained elevated.  Nephrology to decide again tomorrow if he need another dialysis.  Improving leukocytosis, elevated kappa and lambda with normal ratio, rest of the labs pending.  ID is now on board and likely switch to p.o. antibiotics soon.  9/5: Some worsening confusion overnight with poor p.o. intake.  Renal function seems stable but GFR remains at 13-going for another dialysis today.  9/6: Vitals and renal function stable, unable to do HD yesterday due to mechanical issues with machine, plan to do hemodialysis today at [facility]  Potassium 5.7-Lokelma ordered.  Worsening leukocytosis, message sent to hematology. Ordered left ankle MRI due to worsening pain and swelling, x-ray was negative. Antibiotics were stopped yesterday.  9/7: Still feeling weak, renal function improved after getting dialysis yesterday, temperature catheter was removed and he will be monitor over the weekend.  Still oliguric.  Worsening leukocytosis.  Likely will get bone marrow biopsy on Monday-hematology placed orders. MRI of left ankle with complete tear of talofibular ligament and multiple other ligamental injuries-podiatry was consulted  9/8: Slight worsening of renal function with mild hyperkalemia.  Leukocytosis seems stable.  Podiatry is recommending conservative management of his ankle injuries with CAM boot for 4 to 6 weeks and outpatient follow-up.  Going for bone marrow biopsy tomorrow.  9/9: Renal functions and hyperkalemia with slight worsening.  Bone marrow biopsy is being rescheduled for tomorrow due to lab scheduling issues.  UOP of 1100, if continue to have worsening renal function  then nephrology will put permanent HD catheter tomorrow.  Assessment and  Plan: * Acute renal failure superimposed on stage 3b chronic kidney disease (HCC) Patient with poor p.o. intake, BUN of 72 and creatinine of 6.30 on admission, it was 1.74 early July when he was first seen with fall. Renal ultrasound negative for any obstruction, noted an ill-defined echogenic area in the right upper pole of kidney which can be followed up later on with designated CT scan. Patient was also using NSAID regularly. -Nephrology consult-patient received first dialysis on 10/15/2022, Patient received total of 2 dialysis so far.  Last 1 was on 10/21/2022 Temporary catheter was removed and nephrology will observe over the weekend Slowly worsening renal function, reasonable UOP -Nephrology will observe for another day if continued to get worse then they might place permanent HD catheter tomorrow -Avoid nephrotoxins  Severe sepsis [facility]) Patient met sepsis criteria with tachycardia, severe leukocytosis and severe sepsis with lactic acidosis above 4.3.  Initial procalcitonin was 60.67 which is decreasing. Did develop a low-grade fever overnight again.  Procalcitonin improving.  Preliminary blood cultures negative, urine cultures with Klebsiella pneumonia which shows only resistance to ampicillin and Augmentin.  Patient did received ceftriaxone for the past 3 days.  Vancomycin was added on 10/16/2022.  Left knee aspirate with no growth so far. -ID consult -Completed the course of antibiotic and ID is advising monitoring without antibiotic for now. -Continue to monitor  Leukocytosis Seems stable today, pathologist smear review with immature granulocytes, initial smear review with bandemia.  Urine cultures with Klebsiella pneumonia but patient is being treated with appropriate antibiotics. Significantly elevated D-dimer which continued to get worse.  VTE has been ruled out with negative VQ scan and lower extremity venous Doppler. -Completed the course of antibiotic -Hematology consult-ordered some  more labs, likely leukemoid reaction with severe inflammation and infection but if remain elevated he will need bone marrow biopsy for further evaluation -Bone marrow biopsy rescheduled for tomorrow  Elevated d-dimer Patient with worsening D-dimer, now at 13.  COVID was negative. DVT has been ruled out with negative VQ scan and lower extremity venous Doppler. -Stop heparin infusion -Checking labs for DIC -Continue to monitor  Acute hypoxemic respiratory failure (HCC) Patient did develop acute hypoxic respiratory failure and repeat chest x-ray at that time with concern of pulmonary congestion.  Echocardiogram with normal EF and grade 1 diastolic dysfunction.  VQ scan negative for PE Patient with significant AKI, also concern of NSAID induced renal injury. Started on dialysis. -Continue IV Lasix -Continue supplemental oxygen-wean as tolerated  Frequent falls Patient with history of recent decline, poor appetite and multiple falls.  First fall was on July 4 and since then he was falling couple of times a week.  Left knee imaging done on 8/23 with a concern of nondisplaced fibular neck fracture, Initial plan was outpatient follow-up with orthopedic surgery. Requested orthopedic surgery to evaluate while he is in hospital B12 normal, significantly low vitamin D-started on supplement. PT/OT are recommending SNF  Nicotine dependence, uncomplicated -Nicotine patch  Traumatic rhabdomyolysis (HCC) Likely secondary to recurrent falls.  Hyperkalemia Potassium of 5.5 today, some worsening despite getting Lokelma yesterday. -Switching Lokelma with Veltassa -Continue to monitor  Left ankle pain Patient continued to have worsening pain and edema of left ankle prompted an MRI of left ankle as initial imaging was negative for any fracture. MRI with complete tear of talofibular ligament and multiple other ligamental injuries. -Podiatry was consulted-recommending conservative management with pain  control and CAM  boot for 4 to 6 weeks and outpatient follow-up -Continue with pain management      Subjective: Patient was seen and examined today.  Having some back pain.  Appetite improving.  Physical Exam: Vitals:   10/23/22 1600 10/23/22 2358 10/24/22 0915 10/24/22 1634  BP:  (!) 111/56 123/65 119/63  Pulse:  98 (!) 104 90  Resp:  16 18 18   Temp: 97.7 F (36.5 C) 98.4 F (36.9 C) 98 F (36.7 C) 98.7 F (37.1 C)  TempSrc: Oral     SpO2:  98% 99% 96%  Weight:      Height:       General.  Frail elderly man, in no acute distress. Pulmonary.  Lungs clear bilaterally, normal respiratory effort. CV.  Regular rate and rhythm, no JVD, rub or murmur. Abdomen.  Soft, nontender, nondistended, BS positive. CNS.  Alert and oriented .  No focal neurologic deficit. Extremities.  No edema, no cyanosis, pulses intact and symmetrical.  LLE with Ace wrap Psychiatry.  Judgment and insight appears normal.   Data Reviewed: Prior data reviewed  Family Communication: Discussed with daughter at bedside  Disposition: Status is: Inpatient Remains inpatient appropriate because: Severity of illness  Planned Discharge Destination: Skilled nursing facility  DVT prophylaxis.  Subcu heparin Time spent: 42 minutes  This record has been created using Conservation officer, historic buildings. Errors have been sought and corrected,but may not always be located. Such creation errors do not reflect on the standard of care.   Author: Arnetha Courser, MD 10/24/2022 4:41 PM  For on call review [email or web address].

## 2022-10-24 NOTE — Progress Notes (Signed)
Physical Therapy Treatment Patient Details Name: Joshua Mora MRN: [id number] DOB: 1950/05/03 Today's Date: 10/24/2022   History of Present Illness Pt is a 72 year old presenting to the ED with progressive weakness and frequent falls; admitted with AKI, leukocytosis  PMH significant for osteoarthritis s/p left knee replacement. Pt found to have L fibular head fx. Now with multiple L ankle ligament injuries for which CAM boot is recommended with mobility    PT Comments  Patient needs encouragement to participate with therapy session. Patient seen with OT to maximize outcomes. Total assistance required for bed mobility this session. Activity tolerance is limited by fatigue and pain, despite having pain medication during session. Poor sitting balance with posterior and right lean. Patient declined attempting to stand. Recommend to continue PT to maximize independence and facilitate return to prior level of function.    If plan is discharge home, recommend the following: Two people to help with walking and/or transfers;Two people to help with bathing/dressing/bathroom;Help with stairs or ramp for entrance;Assist for transportation   Can travel by private vehicle     No  Equipment Recommendations  Other (comment) (to be determined at next level of care)    Recommendations for Other Services       Precautions / Restrictions Precautions Precautions: Fall Precaution Comments: has L knee brace from home (was not in the room today) Required Braces or Orthoses: Other Brace Other Brace: CAM walker off while in bed, prevalons in bed Restrictions Weight Bearing Restrictions: Yes LLE Weight Bearing: Weight bearing as tolerated Other Position/Activity Restrictions: with CAM walker in place     Mobility  Bed Mobility Overal bed mobility: Needs Assistance Bed Mobility: Supine to Sit, Sit to Supine     Supine to sit: Total assist, +2 for physical assistance Sit to supine: Total assist, +2  for physical assistance   General bed mobility comments: CAM walker boot placed on LLE prior to mobilizing. significant assistance required for bed mobility today    Transfers                   General transfer comment: patient declined attempts to stand and does not participate with scooting to the right side    Ambulation/Gait                   Stairs             Wheelchair Mobility     Tilt Bed    Modified Rankin (Stroke Patients Only)       Balance Overall balance assessment: Needs assistance Sitting-balance support: Bilateral upper extremity supported, Feet unsupported Sitting balance-Leahy Scale: Poor Sitting balance - Comments: cues to maintain midline sitting posture, posterior and right lean. patient does better when using rails for support. Mod A- brief periods of CGA Postural control: Posterior lean, Right lateral lean                                  Cognition Arousal: Alert Behavior During Therapy: Flat affect Overall Cognitive Status: Within Functional Limits for tasks assessed                                 General Comments: patient keeps eyes closed for most of session today. he needs encouragement to particiapte with delay.        Exercises  General Comments        Pertinent Vitals/Pain Pain Assessment Pain Assessment: 0-10 Pain Score: 10-Worst pain ever Pain Location: L ankle, lower part of L knee Pain Descriptors / Indicators: Discomfort, Grimacing, Guarding, Aching, Moaning Pain Intervention(s): Limited activity within patient's tolerance, Monitored during session, Repositioned, RN gave pain meds during session    Home Living                          Prior Function            PT Goals (current goals can now be found in the care plan section) Acute Rehab PT Goals Patient Stated Goal: pain control PT Goal Formulation: With patient Time For Goal Achievement:  10/29/22 Potential to Achieve Goals: Fair Progress towards PT goals: Progressing toward goals    Frequency    Min 1X/week      PT Plan      Co-evaluation PT/OT/SLP Co-Evaluation/Treatment: Yes Reason for Co-Treatment: Complexity of the patient's impairments (multi-system involvement);To address functional/ADL transfers PT goals addressed during session: Mobility/safety with mobility        AM-PAC PT "6 Clicks" Mobility   Outcome Measure  Help needed turning from your back to your side while in a flat bed without using bedrails?: A Lot Help needed moving from lying on your back to sitting on the side of a flat bed without using bedrails?: Total Help needed moving to and from a bed to a chair (including a wheelchair)?: Total Help needed standing up from a chair using your arms (e.g., wheelchair or bedside chair)?: Total Help needed to walk in hospital room?: Total Help needed climbing 3-5 steps with a railing? : Total 6 Click Score: 7    End of Session   Activity Tolerance: Patient limited by fatigue;Patient limited by pain Patient left: in bed;with call bell/phone within reach;with bed alarm set;with family/visitor present (CAM boot removed, prevlon boots in place) Nurse Communication: Mobility status PT Visit Diagnosis: Muscle weakness (generalized) (M62.81);Repeated falls (R29.6);Difficulty in walking, not elsewhere classified (R26.2)     Time: 1610-9604 PT Time Calculation (min) (ACUTE ONLY): 23 min  Charges:    $Therapeutic Activity: 8-22 mins PT General Charges $$ ACUTE PT VISIT: 1 Visit                     Joshua Mora, PT, MPT    Ina Homes 10/24/2022, 3:56 PM

## 2022-10-24 NOTE — Progress Notes (Signed)
[facility], [place] 10/24/22  Subjective:   Hospital day # 11  Patient sitting up in bed Currently irritated, uncomfortable    Renal: 09/08 0701 - 09/09 0700 In: 120 [P.O.:120] Out: 1100 [Urine:1100] Lab Results  Component Value Date   CREATININE 3.73 (H) 10/24/2022   CREATININE 3.38 (H) 10/23/2022   CREATININE 2.88 (H) 10/22/2022     Objective:  Vital signs in last 24 hours:  Temp:  [97.3 F (36.3 C)-98.4 F (36.9 C)] 98 F (36.7 C) (09/09 0915) Pulse Rate:  [93-104] 104 (09/09 0915) Resp:  [16-18] 18 (09/09 0915) BP: (111-123)/(53-65) 123/65 (09/09 0915) SpO2:  [98 %-99 %] 99 % (09/09 0915)  Weight change:  Filed Weights   10/21/22 0500 10/21/22 1453 10/21/22 1810  Weight: 89 kg 82.6 kg 82.6 kg    Intake/Output:    Intake/Output Summary (Last 24 hours) at 10/24/2022 1328 Last data filed at 10/24/2022 1219 Gross per 24 hour  Intake 250 ml  Output 1300 ml  Net -1050 ml     Physical Exam: General: Critically ill-appearing  HEENT Moist oral mucous membranes  Pulm/lungs Clear, Foley O2  CVS/Heart Irregular  Abdomen:  Soft, mildly distended, nontender  Extremities: Dependent edema present  Neurologic: Alert, responds to simple commands  Skin: Warm, LLE bruising  Access: None       Basic Metabolic Panel:  Recent Labs  Lab 10/20/22 0427 10/21/22 0352 10/22/22 0834 10/23/22 0955 10/24/22 0423  NA 137 140 138 136 136  K 5.1 5.7* 5.0 5.2* 5.5*  CL 103 105 99 101 99  CO2 21* 22 28 25 24   GLUCOSE 118* 104* 100* 93 120*  BUN 103* 101* 55* 77* 88*  CREATININE 4.50* 4.28* 2.88* 3.38* 3.73*  CALCIUM 8.8* 9.0 8.2* 8.1* 8.3*  PHOS 5.9* 6.2* 4.7* 5.8* 6.8*     CBC: Recent Labs  Lab 10/19/22 0605 10/20/22 1227 10/21/22 0352 10/22/22 0834 10/23/22 0955 10/24/22 0423  WBC 29.0* 33.6* 36.3* 37.9* 35.3* 29.8*  NEUTROABS 21.7* 26.9*  --  30.4* 29.1* 23.8*  HGB 10.3* 10.9* 10.9* 10.7* 10.4* 10.2*  HCT 30.0* 30.8* 32.1*  30.9* 31.3* 29.8*  MCV 88.0 86.5 88.2 86.8 89.2 88.2  PLT 258 349 421* 492* 560* 597*      Lab Results  Component Value Date   HEPBSAG NON REACTIVE 10/15/2022   HEPBIGM NON REACTIVE 10/15/2022      Microbiology:  Recent Results (from the past 240 hour(s))  Culture, blood (Routine X 2) w Reflex to ID Panel     Status: None   Collection Time: 10/15/22  1:29 AM   Specimen: Right Antecubital; Blood  Result Value Ref Range Status   Specimen Description RIGHT ANTECUBITAL  Final   Special Requests   Final    BOTTLES DRAWN AEROBIC AND ANAEROBIC Blood Culture adequate volume   Culture   Final    NO GROWTH 5 DAYS Performed at [facility], [address] [place], [place] [address]    Report Status 10/20/2022 FINAL  Final  Culture, blood (Routine X 2) w Reflex to ID Panel     Status: None   Collection Time: 10/15/22 11:41 AM   Specimen: BLOOD  Result Value Ref Range Status   Specimen Description BLOOD BLOOD RIGHT HAND  Final   Special Requests   Final    BOTTLES DRAWN AEROBIC ONLY Blood Culture results may not be optimal due to an inadequate volume of blood received in culture bottles   Culture  Final    NO GROWTH 5 DAYS Performed at [facility], [address] [place] [place], [place] [address]    Report Status 10/20/2022 FINAL  Final  Body fluid culture w Gram Stain     Status: None   Collection Time: 10/17/22  2:02 PM   Specimen: Synovium; Body Fluid  Result Value Ref Range Status   Specimen Description   Final    SYNOVIAL Performed at [facility], [address] [place], [place] [address]    Special Requests   Final    Normal Performed at [facility], [address] [place], [place] [address]    Gram Stain   Final    FEW WBC PRESENT,BOTH PMN AND MONONUCLEAR NO ORGANISMS SEEN    Culture   Final    NO GROWTH 3 DAYS Performed at [facility], [address], [place], [place] [address]    Report Status 10/21/2022 FINAL   Final    Coagulation Studies: No results for input(s): "LABPROT", "INR" in the last 72 hours.   Urinalysis: No results for input(s): "COLORURINE", "LABSPEC", "PHURINE", "GLUCOSEU", "HGBUR", "BILIRUBINUR", "KETONESUR", "PROTEINUR", "UROBILINOGEN", "NITRITE", "LEUKOCYTESUR" in the last 72 hours.  Invalid input(s): "APPERANCEUR"     Imaging: No results found.   Medications:      vitamin C  500 mg Oral BID   Chlorhexidine Gluconate Cloth  6 each Topical Daily   Chlorhexidine Gluconate Cloth  6 each Topical Q0600   cyanocobalamin  1,000 mcg Oral Daily   feeding supplement  237 mL Oral TID BM   folic acid  1 mg Oral Daily   heparin injection (subcutaneous)  5,000 Units Subcutaneous Q8H   multivitamin with minerals  1 tablet Oral Daily   nicotine  14 mg Transdermal Daily   patiromer  16.8 g Oral Daily   thiamine  100 mg Oral Daily   Or   thiamine  100 mg Intravenous Daily   Vitamin D (Ergocalciferol)  50,000 Units Oral Q7 days   acetaminophen, alteplase, alteplase, chlorproMAZINE, heparin, melatonin, morphine injection, ondansetron **OR** ondansetron (ZOFRAN) IV, mouth rinse, polyethylene glycol, prochlorperazine, traMADol, traZODone  Assessment/ Plan:  72 y.o. male with  tobacco use, alcohol abuse, osteoarthritis status post left knee replacement and hyperlipidemia, frequent falls over the past few weeks since July 4 th admitted on 10/13/2022 for AKI (acute kidney injury) (HCC) [N17.9]   Acute Kidney Injury on chronic kidney disease stage IIIB   Baseline creatinine of 1.74, GFR of 41 on 08/18/22 Suspect patient's acute kidney injury secondary to NSAID induced nephropathy and concurrent sepsis. No history of diabetes or hypertension per patient. Renal ultrasound with no obstruction. No IV contrast. -Underwent first hemodialysis treatment on 10/15/2022, last treatment completed on 10/21/22.   - HD temp cath removed  - Creatinine continues to rise. Adequate urine output, 1.1L - If  renal function continues to decline tomorrow, will consider placement of permcath for continued dialysis.   Sepsis Urinary source-urine culture from 10/14/2022 is growing Klebsiella. Completed iv Rocephin and Vancomycin  Hyperkalemia Potassium 5.5, continue daily Veltassa    LOS: 11 [facility] 9/9/20241:28 PM  [facility] [place], [place] [phone]

## 2022-10-24 NOTE — Assessment & Plan Note (Signed)
Potassium of 6.1 today, some worsening despite getting Lokelma, so it was switched with Veltassa but potassium continue to get worse. -Medical management -Will be going for another dialysis today -Continuing Lokelma and Veltassa for both for now -Continue to monitor

## 2022-10-24 NOTE — Consult Note (Signed)
Chief Complaint: Found to have leukocytosis and AKI. Team is concerned for possible leukemoid reaction in the setting of presumptive UTI. Request is for bone marrow biopsy for further evaluation.   Referring Physician(s): Dr. Modena Nunnery  Supervising Physician: Pernell Dupre  Patient Status: [facility] - In-pt  History of Present Illness: Joshua Mora is a 72 y.o. male inpatient. Smoker. History of alcohol abuse, CKD. Presented to the ED at [facility] on 8.29.24 with generalized weakness X 1 month. Found to have leukocytosis and AKI. Team is concerned for possible leukemoid reaction in the setting of presumptive UTI. Request is for bone marrow biopsy for further evaluation.   Daughter at bedside. Patient alert and laying in bed,calm. Endorses left ankle pain. Denies any fevers, headache, chest pain, SOB, cough, abdominal pain, nausea, vomiting or bleeding. Return precautions and treatment recommendations and follow-up discussed with the patient and his daughter.  Both who are agreeable with the plan.    Past Medical History:  Diagnosis Date   Arthritis    Chest pain     Past Surgical History:  Procedure Laterality Date   COLONOSCOPY WITH PROPOFOL N/A 09/12/2016   Procedure: COLONOSCOPY WITH PROPOFOL;  Surgeon: Scot Jun, MD;  Location: [facility] ENDOSCOPY;  Service: Endoscopy;  Laterality: N/A;   COLONOSCOPY WITH PROPOFOL N/A 04/02/2020   Procedure: COLONOSCOPY WITH PROPOFOL;  Surgeon: Pasty Spillers, MD;  Location: [facility] ENDOSCOPY;  Service: Endoscopy;  Laterality: N/A;   JOINT REPLACEMENT     LT TKA 1995   ROTATOR CUFF REPAIR Left 2013   SHOULDER ARTHROSCOPY WITH ROTATOR CUFF REPAIR Right 11/15/2017   Procedure: SHOULDER ARTHROSCOPY WITH ROTATOR CUFF REPAIR;  Surgeon: Lyndle Herrlich, MD;  Location: [facility] ORS;  Service: Orthopedics;  Laterality: Right;    Allergies: Patient has no known allergies.  Medications: Prior to Admission medications   Medication Sig  Start Date End Date Taking? Authorizing Provider  Acetaminophen Extra Strength 500 MG TABS Take 500-1,000 mg by mouth every 4 (four) hours as needed (Pain). 10/06/22  Yes [provider]  IBU 800 MG tablet Take 800 mg by mouth every 8 (eight) hours as needed. 10/06/22  Yes [provider]  meclizine (ANTIVERT) 25 MG tablet Take 25 mg by mouth every 6 (six) hours as needed. 09/09/20  Yes [provider]  meloxicam (MOBIC) 15 MG tablet Take 15 mg by mouth daily. 09/09/20  Yes [provider]  traMADol (ULTRAM) 50 MG tablet Take 50 mg by mouth 2 (two) times daily as needed.   Yes [provider]  VIAGRA 100 MG tablet Take 100 mg by mouth as needed for erectile dysfunction. 09/09/20  Yes [provider]  vitamin B-12 (CYANOCOBALAMIN) 1000 MCG tablet Take 1,000 mcg by mouth daily. 09/09/20  Yes [provider]  HYDROcodone-acetaminophen (NORCO/VICODIN) 5-325 MG tablet Take 1 tablet by mouth every 6 (six) hours as needed. Patient not taking: Reported on 10/14/2022 08/19/22   Darrick Grinder, PA-C     History reviewed. No pertinent family history.  Social History   Socioeconomic History   Marital status: Single    Spouse name: Not on file   Number of children: Not on file   Years of education: Not on file   Highest education level: Not on file  Occupational History   Not on file  Tobacco Use   Smoking status: Every Day    Types: Cigarettes, Cigars   Smokeless tobacco: Never   Tobacco comments:    5  per day  Vaping Use   Vaping status: Never Used  Substance and Sexual Activity   Alcohol use: Yes    Comment: occ   Drug use: No   Sexual activity: Not on file  Other Topics Concern   Not on file  Social History Narrative   Not on file   Social Determinants of Health   Financial Resource Strain: Not on file  Food Insecurity: No Food Insecurity (10/13/2022)   Hunger Vital Sign    Worried About Running Out of Food in the Last  Year: Never true    Ran Out of Food in the Last Year: Never true  Transportation Needs: No Transportation Needs (10/13/2022)   PRAPARE - Administrator, Civil Service (Medical): No    Lack of Transportation (Non-Medical): No  Physical Activity: Not on file  Stress: Not on file  Social Connections: Not on file    Review of Systems: A 12 point ROS discussed and pertinent positives are indicated in the HPI above.  All other systems are negative.  Review of Systems  Constitutional:  Negative for fever.  HENT:  Negative for congestion.   Respiratory:  Negative for cough and shortness of breath.   Cardiovascular:  Negative for chest pain.  Gastrointestinal:  Negative for abdominal pain.  Musculoskeletal:  Positive for myalgias (left ankle pain).  Neurological:  Negative for headaches.  Psychiatric/Behavioral:  Negative for behavioral problems and confusion.     Vital Signs: BP 123/65 (BP Location: Right Arm)   Pulse (!) 104   Temp 98 F (36.7 C)   Resp 18   Ht 5\' 9"  (1.753 m)   Wt 182 lb 1.6 oz (82.6 kg)   SpO2 99%   BMI 26.89 kg/m   Advance Care Plan: The advanced care plan/surrogate decision maker was discussed at the time of visit and documented in the medical record.  daughter   Physical Exam Vitals and nursing note reviewed.  Constitutional:      Appearance: He is well-developed.  HENT:     Head: Normocephalic.  Cardiovascular:     Rate and Rhythm: Normal rate.  Pulmonary:     Effort: Pulmonary effort is normal.  Musculoskeletal:        General: Normal range of motion.     Cervical back: Normal range of motion.  Skin:    General: Skin is warm and dry.  Neurological:     General: No focal deficit present.     Mental Status: He is alert and oriented to person, place, and time.     Imaging: MR ANKLE LEFT WO CONTRAST  Result Date: 10/22/2022 CLINICAL DATA:  Left ankle pain after multiple falls EXAM: MRI OF THE LEFT ANKLE WITHOUT CONTRAST TECHNIQUE:  Multiplanar, multisequence MR imaging of the ankle was performed. No intravenous contrast was administered. COMPARISON:  X-ray 10/13/2022 FINDINGS: Technical Note: Despite efforts by the technologist and patient, motion artifact is present on today's exam and could not be eliminated. This reduces exam sensitivity and specificity. TENDONS Peroneal: Peroneus longus and brevis tendons are intact. The superior peroneal retinaculum is torn (series 6, image 17). Slight medial subluxation of the peroneal tendons without dislocation. Posteromedial: Tibialis posterior, flexor hallucis longus, and flexor digitorum longus tendons are intact and normally positioned. Anterior: Tibialis anterior, extensor hallucis longus, and extensor digitorum longus tendons are intact and normally positioned. Achilles: Distal Achilles tendinosis with tiny partial-thickness insertional tear of the central tendon (series 8, image 13). No full-thickness or retracted tear.  No retrocalcaneal bursal fluid. Plantar Fascia: Intact. LIGAMENTS Lateral: Intact tibiofibular ligaments. Complete tear of the anterior talofibular ligament. The posterior talofibular and calcaneofibular ligaments are thickened and heterogeneous compatible with high-grade sprains. Medial: Deltoid and spring ligaments are severely degenerated, likely sequela of remote trauma. CARTILAGE AND BONES Ankle Joint: Diffuse cartilage thinning, most severely at the medial talar shoulder and within the medial gutter. 6 mm intra-articular loose body adjacent to the lateral talar shoulder anteriorly. There is varus tilt of the talus. No dislocation. Small tibiotalar joint effusion. Subtalar Joints/Sinus Tarsi: No cartilage defect. No effusion. Preservation of the anatomic fat within the sinus tarsi. Bones: No acute fracture or dislocation. Mild marrow edema within the lateral malleolus. Mild pes planovalgus alignment. Other: Elongated fluid collection the lateral aspect of the lower leg and  ankle measuring approximately 6.7 x 0.8 x 6.1 cm. Soft tissue swelling of the dorsal foot. IMPRESSION: 1. Complete tear of the anterior talofibular ligament. High-grade sprains of the posterior talofibular and calcaneofibular ligaments. 2. Mild marrow edema within the lateral malleolus, which may represent a bone contusion. No acute fracture. 3. The superior peroneal retinaculum is torn. 4. Elongated fluid collection the lateral aspect of the lower leg and ankle measuring approximately 6.7 x 0.8 x 6.1 cm, likely a hematoma. A fascial degloving/Morel-Lavallee lesion could also have this appearance. 5. 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 6. Distal Achilles tendinosis with tiny partial-thickness insertional tear of the central tendon. No full-thickness or retracted tear. Electronically Signed   By: Duanne Guess D.O.   On: 10/22/2022 13:30   NM Pulmonary Perfusion  Result Date: 10/15/2022 CLINICAL DATA:  Short of breath.  Concern for pulmonary embolism. EXAM: NUCLEAR MEDICINE PERFUSION LUNG SCAN TECHNIQUE: Perfusion images were obtained in multiple projections after intravenous injection of radiopharmaceutical. Patient imaged with LEFT arm down RADIOPHARMACEUTICALS:  4.7 mCi Tc-46m MAA COMPARISON:  Radiograph 10/15/2022 FINDINGS: No wedge-shaped peripheral perfusion defect within LEFT or RIGHT lung to suggest acute pulmonary embolism. Decreased regional perfusion of the lung apices suggest emphysema. IMPRESSION: No evidence acute pulmonary embolism. Electronically Signed   By: Genevive Bi M.D.   On: 10/15/2022 13:59   DG Chest Port 1 View  Result Date: 10/15/2022 CLINICAL DATA:  Central line placement EXAM: PORTABLE CHEST 1 VIEW COMPARISON:  10/14/2022 FINDINGS: Right internal jugular dialysis catheter in place with the tip in the right atrium. No pneumothorax. Vascular congestion. Interstitial prominence could reflect interstitial edema.  Heart mediastinal contours within normal limits. No visible effusions. IMPRESSION: Right hilus catheter placement with the tip in the right atrium. No pneumothorax. Vascular congestion. Diffuse interstitial prominence could reflect interstitial edema. Electronically Signed   By: Charlett Nose M.D.   On: 10/15/2022 01:29   US Venous Img Upper Uni Left (DVT)  Result Date: 10/14/2022 CLINICAL DATA:  Left arm swelling EXAM: LEFT UPPER EXTREMITY VENOUS DOPPLER ULTRASOUND TECHNIQUE: Gray-scale sonography with graded compression, as well as color Doppler and duplex ultrasound were performed to evaluate the upper extremity deep venous system from the level of the subclavian vein and including the jugular, axillary, basilic, radial, ulnar and upper cephalic vein. Spectral Doppler was utilized to evaluate flow at rest and with distal augmentation maneuvers. COMPARISON:  None Available. FINDINGS: Contralateral Subclavian Vein: Respiratory phasicity is normal and symmetric with the symptomatic side. No evidence of thrombus. Normal compressibility. Internal Jugular Vein: No evidence of thrombus. Normal compressibility, respiratory phasicity and response to augmentation. Subclavian Vein: No evidence of thrombus.  Normal compressibility, respiratory phasicity and response to augmentation. Axillary Vein: No evidence of thrombus. Normal compressibility, respiratory phasicity and response to augmentation. Cephalic Vein: No evidence of thrombus. Normal compressibility, respiratory phasicity and response to augmentation. Basilic Vein: No evidence of thrombus. Normal compressibility, respiratory phasicity and response to augmentation. Brachial Veins: No evidence of thrombus. Normal compressibility, respiratory phasicity and response to augmentation. Radial Veins: No evidence of thrombus. Normal compressibility, respiratory phasicity and response to augmentation. Ulnar Veins: No evidence of thrombus. Normal compressibility, respiratory  phasicity and response to augmentation. Other Findings:  None visualized. IMPRESSION: 1. No evidence of deep venous thrombosis within the left upper extremity. Electronically Signed   By: Sharlet Salina M.D.   On: 10/14/2022 16:56   DG Chest Port 1 View  Result Date: 10/14/2022 CLINICAL DATA:  Hypoxic respiratory failure EXAM: PORTABLE CHEST 1 VIEW COMPARISON:  Chest radiograph 1 day prior FINDINGS: The cardiomediastinal silhouette is stable. A calcified right lower paratracheal lymph node is unchanged. Probable mild pulmonary interstitial edema is unchanged. There is no new or worsening focal airspace disease. There is no pleural effusion or pneumothorax There is no acute osseous abnormality. IMPRESSION: Unchanged mild pulmonary edema. Electronically Signed   By: Lesia Hausen M.D.   On: 10/14/2022 16:02   ECHOCARDIOGRAM COMPLETE  Result Date: 10/14/2022    ECHOCARDIOGRAM REPORT   Patient Name:   AMIL STALLONE Date of Exam: 10/14/2022 Medical Rec #:  [id number]            Height:       69.0 in Accession #:    [id number]           Weight:       185.6 lb Date of Birth:  08/13/1950            BSA:          2.002 m Patient Age:    72 years             BP:           120/61 mmHg Patient Gender: M                    HR:           107 bpm. Exam Location:  [facility] Procedure: 2D Echo, Cardiac Doppler, Color Doppler and Intracardiac            Opacification Agent Indications:     Elevated Troponin  History:         Patient has no prior history of Echocardiogram examinations.                  Signs/Symptoms:Chest Pain; Risk Factors:Diabetes, Dyslipidemia                  and Current Smoker.  Sonographer:     Mikki Harbor Referring Phys:  [id number] Oliver Pila HALL Diagnosing Phys: Julien Nordmann MD  Sonographer Comments: Technically difficult study due to poor echo windows. Image acquisition challenging due to respiratory motion. IMPRESSIONS  1. Left ventricular ejection fraction, by estimation, is 60 to 65%. The left  ventricle has normal function. The left ventricle has no regional wall motion abnormalities. There is mild left ventricular hypertrophy. Left ventricular diastolic parameters are consistent with Grade I diastolic dysfunction (impaired relaxation).  2. Right ventricular systolic function is normal. The right ventricular size is normal. Tricuspid regurgitation signal is inadequate for assessing PA pressure.  3. The mitral valve is normal in structure. No  evidence of mitral valve regurgitation. No evidence of mitral stenosis.  4. The aortic valve has an indeterminant number of cusps. Aortic valve regurgitation is mild. Aortic valve sclerosis is present, with no evidence of aortic valve stenosis.  5. The inferior vena cava is normal in size with greater than 50% respiratory variability, suggesting right atrial pressure of 3 mmHg. FINDINGS  Left Ventricle: Left ventricular ejection fraction, by estimation, is 60 to 65%. The left ventricle has normal function. The left ventricle has no regional wall motion abnormalities. Definity contrast agent was given IV to delineate the left ventricular  endocardial borders. The left ventricular internal cavity size was normal in size. There is mild left ventricular hypertrophy. Left ventricular diastolic parameters are consistent with Grade I diastolic dysfunction (impaired relaxation). Right Ventricle: The right ventricular size is normal. No increase in right ventricular wall thickness. Right ventricular systolic function is normal. Tricuspid regurgitation signal is inadequate for assessing PA pressure. Left Atrium: Left atrial size was normal in size. Right Atrium: Right atrial size was normal in size. Pericardium: There is no evidence of pericardial effusion. Mitral Valve: The mitral valve is normal in structure. No evidence of mitral valve regurgitation. No evidence of mitral valve stenosis. MV peak gradient, 4.5 mmHg. The mean mitral valve gradient is 2.0 mmHg. Tricuspid Valve:  The tricuspid valve is normal in structure. Tricuspid valve regurgitation is mild . No evidence of tricuspid stenosis. Aortic Valve: The aortic valve has an indeterminant number of cusps. Aortic valve regurgitation is mild. Aortic valve sclerosis is present, with no evidence of aortic valve stenosis. Aortic valve mean gradient measures 5.0 mmHg. Aortic valve peak gradient measures 11.2 mmHg. Aortic valve area, by VTI measures 3.30 cm. Pulmonic Valve: The pulmonic valve was normal in structure. Pulmonic valve regurgitation is not visualized. No evidence of pulmonic stenosis. Aorta: The aortic root is normal in size and structure. Venous: The inferior vena cava is normal in size with greater than 50% respiratory variability, suggesting right atrial pressure of 3 mmHg. IAS/Shunts: No atrial level shunt detected by color flow Doppler.  LEFT VENTRICLE PLAX 2D LVIDd:         4.40 cm   Diastology LVIDs:         2.90 cm   LV e' medial:    7.83 cm/s LV PW:         1.20 cm   LV E/e' medial:  10.2 LV IVS:        1.20 cm   LV e' lateral:   8.38 cm/s LVOT diam:     2.20 cm   LV E/e' lateral: 9.6 LV SV:         77 LV SV Index:   39 LVOT Area:     3.80 cm  RIGHT VENTRICLE RV Basal diam:  2.95 cm RV Mid diam:    2.90 cm RV S prime:     18.80 cm/s LEFT ATRIUM           Index        RIGHT ATRIUM           Index LA diam:      2.90 cm 1.45 cm/m   RA Area:     12.20 cm LA Vol (A4C): 46.8 ml 23.38 ml/m  RA Volume:   27.00 ml  13.49 ml/m  AORTIC VALVE                    PULMONIC VALVE AV Area (Vmax):  2.94 cm     PV Vmax:       1.00 m/s AV Area (Vmean):   3.49 cm     PV Peak grad:  4.0 mmHg AV Area (VTI):     3.30 cm AV Vmax:           167.00 cm/s AV Vmean:          97.100 cm/s AV VTI:            0.234 m AV Peak Grad:      11.2 mmHg AV Mean Grad:      5.0 mmHg LVOT Vmax:         129.00 cm/s LVOT Vmean:        89.100 cm/s LVOT VTI:          0.203 m LVOT/AV VTI ratio: 0.87  AORTA Ao Root diam: 3.20 cm MITRAL VALVE MV Area (PHT):  3.46 cm     SHUNTS MV Area VTI:   4.06 cm     Systemic VTI:  0.20 m MV Peak grad:  4.5 mmHg     Systemic Diam: 2.20 cm MV Mean grad:  2.0 mmHg MV Vmax:       1.06 m/s MV Vmean:      61.0 cm/s MV Decel Time: 219 msec MV E velocity: 80.20 cm/s MV A velocity: 114.00 cm/s MV E/A ratio:  0.70 Julien Nordmann MD Electronically signed by Julien Nordmann MD Signature Date/Time: 10/14/2022/1:31:20 PM    Final    US Venous Img Lower Bilateral (DVT)  Result Date: 10/14/2022 CLINICAL DATA:  10026 Shortness of breath 10026 EXAM: BILATERAL LOWER EXTREMITY VENOUS DOPPLER ULTRASOUND TECHNIQUE: Gray-scale sonography with graded compression, as well as color Doppler and duplex ultrasound were performed to evaluate the lower extremity deep venous systems from the level of the common femoral vein and including the common femoral, femoral, profunda femoral, popliteal and calf veins including the posterior tibial, peroneal and gastrocnemius veins when visible. The superficial great saphenous vein was also interrogated. Spectral Doppler was utilized to evaluate flow at rest and with distal augmentation maneuvers in the common femoral, femoral and popliteal veins. COMPARISON:  Lower extremity XRs, 10/13/2022. FINDINGS: RIGHT LOWER EXTREMITY VENOUS Normal compressibility of the RIGHT common femoral, superficial femoral, and popliteal veins, as well as the visualized calf veins. Visualized portions of profunda femoral vein and great saphenous vein unremarkable. No filling defects to suggest DVT on grayscale or color Doppler imaging. Doppler waveforms show normal direction of venous flow, normal respiratory plasticity and response to augmentation. OTHER No evidence of superficial thrombophlebitis or abnormal fluid collection. Limitations: none LEFT LOWER EXTREMITY VENOUS Normal compressibility of the LEFT common femoral, superficial femoral, and popliteal veins, as well as the visualized calf veins. Visualized portions of profunda femoral  vein and great saphenous vein unremarkable. No filling defects to suggest DVT on grayscale or color Doppler imaging. Doppler waveforms show normal direction of venous flow, normal respiratory plasticity and response to augmentation. OTHER No evidence of superficial thrombophlebitis or abnormal fluid collection. Limitations: none IMPRESSION: No evidence of femoropopliteal DVT or superficial thrombophlebitis within either lower extremity. Roanna Banning, MD [facility] Electronically Signed   By: Roanna Banning M.D.   On: 10/14/2022 06:21   DG Chest Port 1 View  Result Date: 10/13/2022 CLINICAL DATA:  16109 Acute respiratory distress [id number] EXAM: PORTABLE CHEST 1 VIEW.  Patient is rotated. COMPARISON:  Chest x-ray 10/13/2022, chest x-ray report 05/15/2017 FINDINGS: Slightly more prominent  thoracic aorta silhouette likely due to portable AP technique and slight patient rotation. The heart and mediastinal contours are otherwise unchanged. Aortic calcification. Chronic calcified right lower paratracheal region lymph nodes suggestive of prior granulomatous disease. Limited evaluation of the lung apices due to overlying mandible. Increased interstitial markings. No focal consolidation. No pulmonary edema. No pleural effusion. No pneumothorax. No acute osseous abnormality. IMPRESSION: 1. Mild pulmonary edema. 2.  Aortic Atherosclerosis (ICD10-I70.0). Electronically Signed   By: Tish Frederickson M.D.   On: 10/13/2022 21:30   DG Ankle Complete Left  Result Date: 10/13/2022 CLINICAL DATA:  Left ankle pain after multiple falls. EXAM: LEFT ANKLE COMPLETE - 3+ VIEW COMPARISON:  None Available. FINDINGS: There is no evidence of acute fracture, dislocation, or joint effusion. There is no evidence of arthropathy. Rounded densities are seen inferior to medial malleolus suggesting degenerative change or old injury. Soft tissues are unremarkable. IMPRESSION: No acute  abnormality seen. Electronically Signed   By: Lupita Raider M.D.   On: 10/13/2022 18:20   CT HEAD WO CONTRAST ( )  Result Date: 10/13/2022 CLINICAL DATA:  Generalized weakness and multiple falls. EXAM: CT HEAD WITHOUT CONTRAST TECHNIQUE: Contiguous axial images were obtained from the base of the skull through the vertex without intravenous contrast. RADIATION DOSE REDUCTION: This exam was performed according to the departmental dose-optimization program which includes automated exposure control, adjustment of the mA and/or kV according to patient size and/or use of iterative reconstruction technique. COMPARISON:  August 18, 2022 FINDINGS: Brain: There is mild to moderate severity cerebral atrophy with widening of the extra-axial spaces and ventricular dilatation. There are areas of decreased attenuation within the white matter tracts of the supratentorial brain, consistent with microvascular disease changes. 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 Vascular: No hyperdense vessel or unexpected calcification. Skull: A chronic left-sided nasal bone fracture is seen. Sinuses/Orbits: No acute finding. Other: None. IMPRESSION: 1. Generalized cerebral atrophy with chronic white matter small vessel ischemic changes. 2. 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 3. No acute intracranial abnormality. Electronically Signed   By: Aram Candela M.D.   On: 10/13/2022 17:05   US RENAL  Result Date: 10/13/2022 CLINICAL DATA:  Acute renal failure EXAM: RENAL / URINARY TRACT ULTRASOUND COMPLETE COMPARISON:  None Available. FINDINGS: Right Kidney: Renal measurements: 10.0 x 5.1 x 5.3 cm = volume: 139.9 mL. No collecting system dilatation or perinephric fluid. Along the upper pole of the kidney is a echogenic area along the cortex measuring 15 x 16 x 15 mm this is of uncertain etiology. An underlying lesion is possible Left Kidney:  Renal measurements: 10.2 by 6.4 x 4.9 cm = volume: 156.6 mL. Echogenicity within normal limits. No mass or hydronephrosis visualized. Bladder: Bladder is underdistended. Other: None. IMPRESSION: No collecting system dilatation. Ill-defined echogenic area in the upper pole of the right kidney measuring 15 mm. Underlying lesion is not excluded. Dedicated cross-sectional imaging study can be performed when clinically appropriate to confirm presence or absence of the lesion Electronically Signed   By: Karen Kays M.D.   On: 10/13/2022 16:51   DG Chest 2 View  Result Date: 10/13/2022 CLINICAL DATA:  Chest pain, weakness EXAM: CHEST - 2 VIEW COMPARISON:  None Available. FINDINGS: Mild left basilar atelectasis. Right lung is clear. No pleural effusion or pneumothorax. The heart is normal in size.  Thoracic aortic atherosclerosis. Degenerative changes of the visualized thoracolumbar spine. IMPRESSION: No acute cardiopulmonary disease. Electronically  Signed   By: Charline Bills M.D.   On: 10/13/2022 15:40   DG Knee 1-2 Views Right  Result Date: 10/10/2022 CLINICAL DATA:  Bilateral knee pain. EXAM: RIGHT KNEE - 1-2 VIEW COMPARISON:  Tibia/fibular radiographs 08/18/2022 FINDINGS: Medial tibiofemoral joint space narrowing. There is mild tricompartmental peripheral spurring. No evidence of fracture. No erosion or focal bone abnormality. No significant knee joint effusion. Minimal quadriceps tendon enthesophyte. There are vascular calcifications. 5 mm density in the subcutaneous tissues anteriorly over the upper tibia is unchanged from prior tibia/fibular radiographs and may represent foreign body versus soft tissue calcifications. IMPRESSION: 1. 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 2. Possible foreign body versus soft tissue calcification in the anterior soft tissues, unchanged from July exam. Electronically Signed   By: Narda Rutherford M.D.   On: 10/10/2022  10:33   DG Knee Complete 4 Views Left  Result Date: 10/10/2022 CLINICAL DATA:  Bilateral knee pain. History of left knee replacement. EXAM: LEFT KNEE - COMPLETE 4+ VIEW COMPARISON:  None Available. FINDINGS: Left knee arthroplasty in expected alignment. There is no periprosthetic lucency. There is a nondisplaced fracture of the proximal fibular neck that may be acute. No periprosthetic fracture. Prior patellar resurfacing. Geographic sclerotic density in the distal femur has the appearance of a bone infarct. Minimal knee joint effusion. Quadriceps tendon enthesophyte. There are vascular calcifications. IMPRESSION: 1. Nondisplaced fracture of the proximal fibular neck that may be acute. 2. Left knee arthroplasty in expected alignment. No hardware complication. Electronically Signed   By: Narda Rutherford M.D.   On: 10/10/2022 10:31    Labs:  CBC: Recent Labs    10/21/22 0352 10/22/22 0834 10/23/22 0955 10/24/22 0423  WBC 36.3* 37.9* 35.3* 29.8*  HGB 10.9* 10.7* 10.4* 10.2*  HCT 32.1* 30.9* 31.3* 29.8*  PLT 421* 492* 560* 597*    COAGS: Recent Labs    10/13/22 2102 10/14/22 1601 10/16/22 1347  INR 1.3* 1.5* 1.4*  APTT  --  30 65*    BMP: Recent Labs    10/21/22 0352 10/22/22 0834 10/23/22 0955 10/24/22 0423  NA 140 138 136 136  K 5.7* 5.0 5.2* 5.5*  CL 105 99 101 99  CO2 22 28 25 24   GLUCOSE 104* 100* 93 120*  BUN 101* 55* 77* 88*  CALCIUM 9.0 8.2* 8.1* 8.3*  CREATININE 4.28* 2.88* 3.38* 3.73*  GFRNONAA 14* 22* 18* 16*    LIVER FUNCTION TESTS: Recent Labs    10/13/22 2102 10/14/22 0234 10/18/22 0503 10/21/22 0352 10/22/22 0834 10/23/22 0955 10/24/22 0423  BILITOT 0.8 0.5  --   --   --   --   --   AST 48* 46*  --   --   --   --   --   ALT 27 27  --   --   --   --   --   ALKPHOS 41 35*  --   --   --   --   --   PROT 6.8 6.8  --   --   --   --   --   ALBUMIN 3.1* 2.9*   < > 2.5* 2.4* 2.3* 2.3*   < > = values in this interval not displayed.      Assessment and Plan:  72 y.o. male inpatient. Smoker. History of alcohol abuse, CKD. Presented to the ED at [facility] on 8.29.24 with generalized weakness X 1 month. Found to have leukocytosis and AKI. Team is  concerned for possible leukemoid reaction in the setting of presumptive UTI. Request is for bone marrow biopsy for further evaluation.   BUN 88, Cr 3.73, GFR < 16, albumin 2.3, WBC  29.8. Patient is on subcutaneous prophylactic dose of heparin.NKDA.   IR consulted for possible bone marrow biopsy.  Patient tentatively scheduled for 9.10.24.  Team instructed to: Keep Patient to be NPO after midnight Hold prophylactic anticoagulation 24 hours prior to scheduled procedure.  IR will call patient when ready.  Risks and benefits of bone marrow biopsy  was discussed with the patient and/or patient's family including, but not limited to bleeding, infection, damage to adjacent structures or low yield requiring additional tests.  All of the questions were answered and there is agreement to proceed.  Consent signed and in chart.    Patient is on heparin prophylactic dosage. Last dose given on 9.8.24  Thank you for this interesting consult.  I greatly enjoyed meeting Toua Droge and look forward to participating in their care.  A copy of this report was sent to the requesting provider on this date.  Electronically Signed: Alene Mires, NP 10/24/2022, 11:56 AM   I spent a total of 40 Minutes    in face to face in clinical consultation, greater than 50% of which was counseling/coordinating care for bone marrow biopsy

## 2022-10-24 NOTE — Plan of Care (Signed)
  Problem: Education: Goal: Knowledge of General Education information will improve Description: Including pain rating scale, medication(s)/side effects and non-pharmacologic comfort measures Outcome: Progressing   Problem: Clinical Measurements: Goal: Ability to maintain clinical measurements within normal limits will improve Outcome: Progressing Goal: Will remain free from infection Outcome: Progressing   Problem: Elimination: Goal: Will not experience complications related to bowel motility Outcome: Progressing Goal: Will not experience complications related to urinary retention Outcome: Progressing   Problem: Pain Managment: Goal: General experience of comfort will improve Outcome: Progressing   Problem: Safety: Goal: Ability to remain free from injury will improve Outcome: Progressing

## 2022-10-25 ENCOUNTER — Inpatient Hospital Stay: Payer: Medicare PPO | Admitting: Radiology

## 2022-10-25 ENCOUNTER — Encounter
Admission: EM | Disposition: A | Discharge status: Skilled Nursing Facility | Payer: Self-pay | Source: Home / Self Care | Attending: Internal Medicine

## 2022-10-25 DIAGNOSIS — T82858A Stenosis of vascular prosthetic devices, implants and grafts, initial encounter: Secondary | ICD-10-CM

## 2022-10-25 DIAGNOSIS — N186 End stage renal disease: Secondary | ICD-10-CM

## 2022-10-25 DIAGNOSIS — D72825 Bandemia: Secondary | ICD-10-CM | POA: Diagnosis not present

## 2022-10-25 DIAGNOSIS — T82868A Thrombosis of vascular prosthetic devices, implants and grafts, initial encounter: Secondary | ICD-10-CM

## 2022-10-25 DIAGNOSIS — Z992 Dependence on renal dialysis: Secondary | ICD-10-CM

## 2022-10-25 DIAGNOSIS — T82898A Other specified complication of vascular prosthetic devices, implants and grafts, initial encounter: Secondary | ICD-10-CM

## 2022-10-25 DIAGNOSIS — A419 Sepsis, unspecified organism: Secondary | ICD-10-CM | POA: Diagnosis not present

## 2022-10-25 DIAGNOSIS — N179 Acute kidney failure, unspecified: Secondary | ICD-10-CM | POA: Diagnosis not present

## 2022-10-25 DIAGNOSIS — J9601 Acute respiratory failure with hypoxia: Secondary | ICD-10-CM | POA: Diagnosis not present

## 2022-10-25 HISTORY — PX: TEMPORARY DIALYSIS CATHETER: CATH118312

## 2022-10-25 HISTORY — PX: IR BONE MARROW BIOPSY & ASPIRATION: IMG5727

## 2022-10-25 LAB — CBC WITH DIFFERENTIAL/PLATELET
Abs Immature Granulocytes: 0.33 10*3/uL — ABNORMAL HIGH (ref 0.00–0.07)
Basophils Absolute: 0.1 10*3/uL (ref 0.0–0.1)
Basophils Relative: 0 %
Eosinophils Absolute: 0.2 10*3/uL (ref 0.0–0.5)
Eosinophils Relative: 1 %
HCT: 32.3 % — ABNORMAL LOW (ref 39.0–52.0)
Hemoglobin: 10.4 g/dL — ABNORMAL LOW (ref 13.0–17.0)
Immature Granulocytes: 1 %
Lymphocytes Relative: 7 %
Lymphs Abs: 2 10*3/uL (ref 0.7–4.0)
MCH: 29.5 pg (ref 26.0–34.0)
MCHC: 32.2 g/dL (ref 30.0–36.0)
MCV: 91.5 fL (ref 80.0–100.0)
Monocytes Absolute: 2.7 10*3/uL — ABNORMAL HIGH (ref 0.1–1.0)
Monocytes Relative: 10 %
Neutro Abs: 22.6 10*3/uL — ABNORMAL HIGH (ref 1.7–7.7)
Neutrophils Relative %: 81 %
Platelets: 692 10*3/uL — ABNORMAL HIGH (ref 150–400)
RBC: 3.53 MIL/uL — ABNORMAL LOW (ref 4.22–5.81)
RDW: 16.5 % — ABNORMAL HIGH (ref 11.5–15.5)
Smear Review: NORMAL
WBC: 27.9 10*3/uL — ABNORMAL HIGH (ref 4.0–10.5)
nRBC: 0 % (ref 0.0–0.2)

## 2022-10-25 LAB — RENAL FUNCTION PANEL
Albumin: 2.4 g/dL — ABNORMAL LOW (ref 3.5–5.0)
Albumin: 2.3 g/dL — ABNORMAL LOW (ref 3.5–5.0)
Anion gap: 9 (ref 5–15)
Anion gap: 12 (ref 5–15)
BUN: 95 mg/dL — ABNORMAL HIGH (ref 8–23)
BUN: 92 mg/dL — ABNORMAL HIGH (ref 8–23)
CO2: 24 mmol/L (ref 22–32)
CO2: 22 mmol/L (ref 22–32)
Calcium: 8.9 mg/dL (ref 8.9–10.3)
Calcium: 8.7 mg/dL — ABNORMAL LOW (ref 8.9–10.3)
Chloride: 105 mmol/L (ref 98–111)
Chloride: 104 mmol/L (ref 98–111)
Creatinine, Ser: 3.47 mg/dL — ABNORMAL HIGH (ref 0.61–1.24)
Creatinine, Ser: 3.5 mg/dL — ABNORMAL HIGH (ref 0.61–1.24)
GFR, Estimated: 18 mL/min — ABNORMAL LOW (ref >60)
GFR, Estimated: 18 mL/min — ABNORMAL LOW (ref >60)
Glucose, Bld: 112 mg/dL — ABNORMAL HIGH (ref 70–99)
Glucose, Bld: 106 mg/dL — ABNORMAL HIGH (ref 70–99)
Phosphorus: 5.8 mg/dL — ABNORMAL HIGH (ref 2.5–4.6)
Phosphorus: 6.4 mg/dL — ABNORMAL HIGH (ref 2.5–4.6)
Potassium: 6.1 mmol/L — ABNORMAL HIGH (ref 3.5–5.1)
Potassium: 6.2 mmol/L — ABNORMAL HIGH (ref 3.5–5.1)
Sodium: 138 mmol/L (ref 135–145)
Sodium: 138 mmol/L (ref 135–145)

## 2022-10-25 LAB — POTASSIUM
Potassium: 4.6 mmol/L (ref 3.5–5.1)
Potassium: 4.8 mmol/L (ref 3.5–5.1)

## 2022-10-25 LAB — GLUCOSE, CAPILLARY: Glucose-Capillary: 92 mg/dL (ref 70–99)

## 2022-10-25 SURGERY — TEMPORARY DIALYSIS CATHETER
Anesthesia: LOCAL

## 2022-10-25 MED ORDER — LIDOCAINE HCL (PF) 1 % IJ SOLN
INTRAMUSCULAR | Status: DC | PRN
  Administered 2022-10-25: 10 mL

## 2022-10-25 MED ORDER — SODIUM CHLORIDE 0.9 % IV SOLN: INTRAVENOUS | Status: DC

## 2022-10-25 MED ORDER — SODIUM ZIRCONIUM CYCLOSILICATE 10 G PO PACK
10.0000 g | PACK | Freq: Once | ORAL | Status: AC
  Administered 2022-10-25: 10 g via ORAL
  Filled 2022-10-25: qty 1

## 2022-10-25 MED ORDER — FENTANYL CITRATE (PF) 100 MCG/2ML IJ SOLN
INTRAMUSCULAR | Status: AC | PRN
Start: 2022-10-25 — End: 2022-10-25
  Administered 2022-10-25 (×2): 25 ug via INTRAVENOUS

## 2022-10-25 MED ORDER — MIDAZOLAM HCL 2 MG/2ML IJ SOLN
INTRAMUSCULAR | Status: AC
  Filled 2022-10-25: qty 2

## 2022-10-25 MED ORDER — PROSOURCE PLUS PO LIQD
30.0000 mL | Freq: Two times a day (BID) | ORAL | Status: DC
  Administered 2022-10-26 – 2022-11-03 (×15): 30 mL via ORAL
  Filled 2022-10-25 (×21): qty 30

## 2022-10-25 MED ORDER — NEPRO/CARBSTEADY PO LIQD
237.0000 mL | Freq: Three times a day (TID) | ORAL | Status: DC
  Administered 2022-10-25 – 2022-11-03 (×23): 237 mL via ORAL

## 2022-10-25 MED ORDER — HEPARIN SOD (PORK) LOCK FLUSH 100 UNIT/ML IV SOLN
INTRAVENOUS | Status: AC
  Filled 2022-10-25: qty 5

## 2022-10-25 MED ORDER — INSULIN ASPART 100 UNIT/ML IV SOLN
10.0000 [IU] | Freq: Once | INTRAVENOUS | Status: DC
  Filled 2022-10-25: qty 0.1

## 2022-10-25 MED ORDER — LIDOCAINE 1 % OPTIME INJ - NO CHARGE
10.0000 mL | Freq: Once | INTRAMUSCULAR | Status: AC
  Administered 2022-10-25: 10 mL via INTRADERMAL

## 2022-10-25 MED ORDER — NEPRO/CARBSTEADY PO LIQD: 237.0000 mL | Freq: Two times a day (BID) | ORAL | Status: DC

## 2022-10-25 MED ORDER — RENA-VITE PO TABS
1.0000 | ORAL_TABLET | Freq: Every day | ORAL | Status: DC
  Administered 2022-10-25 – 2022-11-03 (×9): 1 via ORAL
  Filled 2022-10-25 (×10): qty 1

## 2022-10-25 MED ORDER — CALCIUM GLUCONATE-NACL 1-0.675 GM/50ML-% IV SOLN
1.0000 g | Freq: Once | INTRAVENOUS | Status: DC
  Filled 2022-10-25: qty 50

## 2022-10-25 MED ORDER — SODIUM ZIRCONIUM CYCLOSILICATE 10 G PO PACK
10.0000 g | PACK | Freq: Every day | ORAL | Status: DC
  Administered 2022-10-26 – 2022-10-28 (×3): 10 g via ORAL
  Filled 2022-10-25 (×5): qty 1

## 2022-10-25 MED ORDER — SODIUM CHLORIDE 0.9 % IV BOLUS: 500.0000 mL | Freq: Once | INTRAVENOUS | Status: DC

## 2022-10-25 MED ORDER — DEXTROSE 50 % IV SOLN
1.0000 | Freq: Once | INTRAVENOUS | Status: DC
  Filled 2022-10-25: qty 50

## 2022-10-25 MED ORDER — MORPHINE SULFATE (PF) 2 MG/ML IV SOLN
2.0000 mg | Freq: Once | INTRAVENOUS | Status: AC
  Administered 2022-10-25: 2 mg via INTRAVENOUS

## 2022-10-25 MED ORDER — MIDAZOLAM HCL 2 MG/2ML IJ SOLN
INTRAMUSCULAR | Status: AC | PRN
Start: 2022-10-25 — End: 2022-10-25
  Administered 2022-10-25: .5 mg via INTRAVENOUS

## 2022-10-25 MED ORDER — FENTANYL CITRATE (PF) 100 MCG/2ML IJ SOLN
INTRAMUSCULAR | Status: AC
  Filled 2022-10-25: qty 2

## 2022-10-25 MED ORDER — HEPARIN SODIUM (PORCINE) 1000 UNIT/ML IJ SOLN
INTRAMUSCULAR | Status: AC
  Filled 2022-10-25: qty 10

## 2022-10-25 SURGICAL SUPPLY — 3 items
GOWN STRL REUS W/ TWL LRG LVL3 (GOWN DISPOSABLE) ×1 IMPLANT
GOWN STRL REUS W/TWL LRG LVL3 (GOWN DISPOSABLE) ×1
KIT DIALYSIS CATH TRI 30X13 (CATHETERS) IMPLANT

## 2022-10-25 NOTE — Progress Notes (Addendum)
Potassium level resulted, 4.6. Acid bath changed to 2k+, Ca 2.5++ . Dr. Cherylann Ratel notified.   Patient with HR in 120's-130,  drop in B/P, Sinus Tachy, reached out to Dr. Cherylann Ratel, who advised to reduce BFR to 250, bolus with NS, and cease fluid removal. Patient denies chest pain or discomfort, responses appropriately, states he is cold, given warm blanket, ongoing monitoring.

## 2022-10-25 NOTE — Op Note (Signed)
  OPERATIVE NOTE   PROCEDURE: Insertion of temporary dialysis catheter catheter right femoral approach.  PRE-OPERATIVE DIAGNOSIS: Acute on chronic renal insufficiency requiring hemodialysis  POST-OPERATIVE DIAGNOSIS: Same  SURGEON: Renford Dills M.D.  ANESTHESIA: 1% lidocaine local infiltration  ESTIMATED BLOOD LOSS: Minimal cc  INDICATIONS:   Joshua Mora is a 72 y.o. male who presents with worsening of his renal function.  He now requires dialysis again and therefore a temporary catheter is being placed until his uremia is improved and a tunnel catheter can be put in safely..  DESCRIPTION: After obtaining full informed written consent, the patient was positioned supine. The right groin was prepped and draped in a sterile fashion. Ultrasound was placed in a sterile sleeve. Ultrasound was utilized to identify the right common femoral vein which is noted to be echolucent and compressible indicating patency. Images recorded for the permanent record. Under real-time visualization a Seldinger needle is inserted into the vein and the guidewires advanced without difficulty. Small counterincision was made at the wire insertion site. Dilator is passed over the wire and the temporary dialysis catheter catheter is fed over the wire without difficulty.  All lumens aspirate and flush easily and are packed with heparin saline. Catheter secured to the skin of the right thigh with 2-0 silk. A sterile dressing is applied with Biopatch.  COMPLICATIONS: None  CONDITION: Unchanged  Levora Dredge Office:  [phone] 10/25/2022, 11:41 AM

## 2022-10-25 NOTE — Progress Notes (Signed)
Nutrition Follow-up  DOCUMENTATION CODES:   Not applicable  INTERVENTION:   -D/c MVI with minerals daily -Continue Nepro Shake po TID, each supplement provides 425 kcal and 19 grams protein  -Renal MVI daily -30 ml Prosource Plus BID, each supplement provides 100 kcals and 15 grams protein  NUTRITION DIAGNOSIS:   Inadequate oral intake related to poor appetite as evidenced by per patient/family report.  Ongoing  GOAL:   Patient will meet greater than or equal to 90% of their needs  Progressing   MONITOR:   PO intake, Supplement acceptance, Labs, Weight trends, Skin, I & O's, Diet advancement  REASON FOR ASSESSMENT:   Malnutrition Screening Tool    ASSESSMENT:   72 y.o. male with medical history significant of osteoarthritis s/p left knee replacement who is admitted with weakness, frequent falls and AKI.  8/31- trialysis cath placed, first HD treatment  9/2- aspiration of lt knee by orthopedics to rule out infection; cultures pending 9/6- second HD treatment 9/7- temporary HD cath removed 9/10- temporary HD cath placed  Reviewed I/O's: -450 ml x 24 hours and +520 ml since admission  UOP: 700 ml x 24 hours   Pt out of room for procedure at time of visit. No family present.   Per oncology notes, pt with persistent leukocytosis with unclear etiology (infectious issues resolved). Pt for bone marrow biopsy today.   Per nephrology notrs, plan for permacath this week as well as HD center placement. Plan for HD today.   Pt with fair oral intake. Noted meal completions 25-75%.   Reviewed wt hx; pt has experienced a 9% wt loss over the past week, which is significant for time frame.  Per TOC notes, plan for SNF once medically stable.   Medications reviewed and include vitamin C, vitamin B-12, folic acid, veltassa loklema, vitamin D, and thiamine.   Labs reviewed: K: 6.2, Phos: 6.4, CBGS: 92-120 (inpatient orders for glycemic control are none).    Diet Order:    Diet Order             Diet NPO time specified  Diet effective midnight                   EDUCATION NEEDS:   Education needs have been addressed  Skin:  Skin Assessment: Reviewed RN Assessment  Last BM:  10/24/22 (type 6)  Height:   Ht Readings from Last 1 Encounters:  10/14/22 5\' 9"  (1.753 m)    Weight:   Wt Readings from Last 1 Encounters:  10/25/22 78.9 kg    Ideal Body Weight:  72.7 kg  BMI:  Body mass index is 25.69 kg/m.  Estimated Nutritional Needs:   Kcal:  1900-2200kcal/day  Protein:  95-110g/day  Fluid:  1000 ml + UOP    Levada Schilling, RD, LDN, CDCES Registered Dietitian II Certified Diabetes Care and Education Specialist Please refer to [facility] for RD and/or RD on-call/weekend/after hours pager

## 2022-10-25 NOTE — Progress Notes (Signed)
[facility], [place] 10/25/22  Subjective:   Hospital day # 12  Patient seen resting in bed, daughter at bedside Patient remains drowsy from bone marrow procedure  Creatinine 3.47 Potassium 6.1 Urine output 700 mL  Renal: 09/09 0701 - 09/10 0700 In: 250 [P.O.:250] Out: 700 [Urine:700] Lab Results  Component Value Date   CREATININE 3.47 (H) 10/25/2022   CREATININE 3.73 (H) 10/24/2022   CREATININE 3.38 (H) 10/23/2022     Objective:  Vital signs in last 24 hours:  Temp:  [97.7 F (36.5 C)-98.9 F (37.2 C)] 97.7 F (36.5 C) (09/10 1222) Pulse Rate:  [0-107] 96 (09/10 1222) Resp:  [10-20] 16 (09/10 1222) BP: (104-154)/(42-79) 117/64 (09/10 1222) SpO2:  [91 %-97 %] 92 % (09/10 1222) Weight:  [82.7 kg] 82.7 kg (09/10 0500)  Weight change:  Filed Weights   10/21/22 1453 10/21/22 1810 10/25/22 0500  Weight: 82.6 kg 82.6 kg 82.7 kg    Intake/Output:    Intake/Output Summary (Last 24 hours) at 10/25/2022 1312 Last data filed at 10/25/2022 8657 Gross per 24 hour  Intake --  Output 400 ml  Net -400 ml     Physical Exam: General: Critically ill-appearing  HEENT Moist oral mucous membranes  Pulm/lungs Clear, [place] O2  CVS/Heart Irregular  Abdomen:  Soft, mildly distended, nontender  Extremities: Dependent edema present  Neurologic: Somnolent  Skin: Warm, LLE bruising  Access: None       Basic Metabolic Panel:  Recent Labs  Lab 10/21/22 0352 10/22/22 0834 10/23/22 0955 10/24/22 0423 10/25/22 0453  NA 140 138 136 136 138  K 5.7* 5.0 5.2* 5.5* 6.1*  CL 105 99 101 99 105  CO2 22 28 25 24 24   GLUCOSE 104* 100* 93 120* 112*  BUN 101* 55* 77* 88* 95*  CREATININE 4.28* 2.88* 3.38* 3.73* 3.47*  CALCIUM 9.0 8.2* 8.1* 8.3* 8.9  PHOS 6.2* 4.7* 5.8* 6.8* 5.8*     CBC: Recent Labs  Lab 10/20/22 1227 10/21/22 0352 10/22/22 0834 10/23/22 0955 10/24/22 0423 10/25/22 0453  WBC 33.6* 36.3* 37.9* 35.3* 29.8* 27.9*  NEUTROABS  26.9*  --  30.4* 29.1* 23.8* 22.6*  HGB 10.9* 10.9* 10.7* 10.4* 10.2* 10.4*  HCT 30.8* 32.1* 30.9* 31.3* 29.8* 32.3*  MCV 86.5 88.2 86.8 89.2 88.2 91.5  PLT 349 421* 492* 560* 597* 692*      Lab Results  Component Value Date   HEPBSAG NON REACTIVE 10/15/2022   HEPBIGM NON REACTIVE 10/15/2022      Microbiology:  Recent Results (from the past 240 hour(s))  Body fluid culture w Gram Stain     Status: None   Collection Time: 10/17/22  2:02 PM   Specimen: Synovium; Body Fluid  Result Value Ref Range Status   Specimen Description   Final    SYNOVIAL Performed at [facility], [address] [place], [place] [address]    Special Requests   Final    Normal Performed at [facility], [address] [place], [place] [address]    Gram Stain   Final    FEW WBC PRESENT,BOTH PMN AND MONONUCLEAR NO ORGANISMS SEEN    Culture   Final    NO GROWTH 3 DAYS Performed at [facility], [address] [place], [place] [address]    Report Status 10/21/2022 FINAL  Final    Coagulation Studies: No results for input(s): "LABPROT", "INR" in the last 72 hours.   Urinalysis: No results for input(s): "COLORURINE", "  LABSPEC", "PHURINE", "GLUCOSEU", "HGBUR", "BILIRUBINUR", "KETONESUR", "PROTEINUR", "UROBILINOGEN", "NITRITE", "LEUKOCYTESUR" in the last 72 hours.  Invalid input(s): "APPERANCEUR"     Imaging: PERIPHERAL VASCULAR CATHETERIZATION  Result Date: 10/25/2022 See surgical note for result.  IR BONE MARROW BIOPSY & ASPIRATION  Result Date: 10/25/2022 INDICATION: Hematologic abnormality EXAM: Bone marrow aspiration and core biopsy using fluoroscopic guidance MEDICATIONS: None. ANESTHESIA/SEDATION: Moderate (conscious) sedation was employed during this procedure. A total of Versed 0.5 mg and Fentanyl 50 mcg was administered intravenously. Moderate Sedation Time: 16 minutes. The patient's level of consciousness and vital signs were monitored continuously by  radiology nursing throughout the procedure under my direct supervision. FLUOROSCOPY TIME:  Fluoroscopy Time: 1.0 minutes (29 mGy) COMPLICATIONS: None immediate. PROCEDURE: Informed written consent was obtained from the patient after a thorough discussion of the procedural risks, benefits and alternatives. All questions were addressed. Maximal Sterile Barrier Technique was utilized including caps, mask, sterile gowns, sterile gloves, sterile drape, hand hygiene and skin antiseptic. A timeout was performed prior to the initiation of the procedure. The patient was placed prone on the exam table. Limited fluoroscopy of the pelvis was performed for planning purposes. Skin entry site was marked, and the overlying skin was prepped and draped in the standard sterile fashion. Local analgesia was obtained with 1% lidocaine. Using fluoroscopic guidance, an 11 gauge needle was advanced just deep to the cortex of the right posterior ilium. Subsequently, bone marrow aspiration and core biopsy were performed. Specimens were submitted to lab/pathology for handling. Hemostasis was achieved with manual pressure, and a clean dressing was placed. The patient tolerated the procedure well without immediate complication. IMPRESSION: Successful bone marrow aspiration and core biopsy of the right posterior ilium using fluoroscopic guidance. Electronically Signed   By: Olive Bass M.D.   On: 10/25/2022 11:21     Medications:    sodium chloride     calcium gluconate     sodium chloride       vitamin C  500 mg Oral BID   Chlorhexidine Gluconate Cloth  6 each Topical Daily   Chlorhexidine Gluconate Cloth  6 each Topical Q0600   cyanocobalamin  1,000 mcg Oral Daily   insulin aspart  10 Units Intravenous Once   And   dextrose  1 ampule Intravenous Once   feeding supplement (NEPRO CARB STEADY)  237 mL Oral BID BM   folic acid  1 mg Oral Daily   heparin injection (subcutaneous)  5,000 Units Subcutaneous Q8H   multivitamin  with minerals  1 tablet Oral Daily   nicotine  14 mg Transdermal Daily   patiromer  16.8 g Oral Daily   sodium zirconium cyclosilicate  10 g Oral Daily   thiamine  100 mg Oral Daily   Or   thiamine  100 mg Intravenous Daily   Vitamin D (Ergocalciferol)  50,000 Units Oral Q7 days   acetaminophen, alteplase, alteplase, chlorproMAZINE, heparin, melatonin, morphine injection, ondansetron **OR** ondansetron (ZOFRAN) IV, mouth rinse, polyethylene glycol, prochlorperazine, traMADol, traZODone  Assessment/ Plan:  72 y.o. male with  tobacco use, alcohol abuse, osteoarthritis status post left knee replacement and hyperlipidemia, frequent falls over the past few weeks since July 4 th admitted on 10/13/2022 for AKI (acute kidney injury) (HCC) [N17.9]   Acute Kidney Injury on chronic kidney disease stage IIIB   Baseline creatinine of 1.74, GFR of 41 on 08/18/22 Suspect patient's acute kidney injury secondary to NSAID induced nephropathy and concurrent sepsis. No history of diabetes or hypertension per patient. Renal ultrasound with  no obstruction. No IV contrast. -Underwent first hemodialysis treatment on 10/15/2022, last treatment completed on 10/21/22.   - Though creatinine is stable, urine output reduced and BUN remains elevated - Based on the above mentioned statement and elevated potassium, will proceed with dialysis. Family agreeable.  _ Vascular surgery consulted to place HD temp cath - Will need permcath placed later this week - Renal navigator seeking outpatient dialysis clinic.  Sepsis Urinary source-urine culture from 10/14/2022 is growing Klebsiella. Completed iv antibiotics  Hyperkalemia Potassium 6.1, remains on daily Veltassa. Changed supplements to Nepro to reduce potassium. Also ordered [facility] daily by primary team   LOS: 12 Wendee Beavers 9/10/20241:12 PM  [facility] [place], [place] [phone]

## 2022-10-25 NOTE — Plan of Care (Signed)

## 2022-10-25 NOTE — Op Note (Signed)
OPERATIVE NOTE   PROCEDURE: Contrast injection left arm brachiocephalic fistula Thrombectomy left arm brachiocephalic fistula with combination of tPA and the penumbra CAT 7D aspiration catheter. Percutaneous transluminal angioplasty and stent placement left arm brachiocephalic fistula. Placement of a right femoral temporary dialysis catheter with ultrasound guidance  PRE-OPERATIVE DIAGNOSIS: Complication of dialysis access                                                       End Stage Renal Disease  POST-OPERATIVE DIAGNOSIS: same as above   SURGEON: Renford Dills, M.D.  ANESTHESIA: Conscious sedation was administered by the radiology RN under my direct supervision. IV Versed plus fentanyl were utilized. Continuous ECG, pulse oximetry and blood pressure was monitored throughout the entire procedure. Conscious sedation was for a total of 2 hours 16 minutes and 29 seconds.    ESTIMATED BLOOD LOSS: 100 cc  FINDING(S): Massive hematoma surrounding the cephalic vein likely causing extrinsic compression been resulting in thrombosis of the vein.  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  Clearly, the hematoma resulted from the profound difficulty in accessing this vein secondary to its underlying structures.  Central veins as well as the subclavian cephalic confluence are all widely patent  SPECIMEN(S):  None  CONTRAST: 45 cc  FLUOROSCOPY TIME: 7.9 minutes  INDICATIONS: Joshua Mora is a 72 y.o. male who  presents with malfunctioning thrombosed left arm brachiocephalic AV access associated with a massive hematoma.  The patient is scheduled for angiography with possible intervention of the AV access.  The patient is aware the risks include but are not limited to: bleeding, infection, thrombosis of the cannulated access, and possible anaphylactic reaction to the contrast.  The patient acknowledges  if the access can not be salvaged a temporary catheter will be needed and will be placed during this procedure.  The patient is aware of the risks of the procedure and elects to proceed with the angiogram and intervention.  DESCRIPTION: After full informed written consent was obtained, the patient was brought back to the Special Procedure suite and placed supine position.  Appropriate cardiopulmonary monitors were placed.  The left arm was prepped and draped in the standard fashion.  Appropriate timeout is called. The left brachiocephalic was cannulated with a micropuncture needle using ultrasound guidance.  With the ultrasound the AV access appeared to be filled with heterogeneous material and was poorly compressible indicating thrombosis of the AV access. The puncture was made under direct ultrasound visualization and an image was recorded for the permanent record.  A spot just above the actual anastomosis was selected as this appeared to be patent and free of any thrombotic material.  The microwire was advanced and the needle was exchanged for  a microsheath.  The J-wire was then advanced and a 6 Fr sheath inserted.  Hand was then performed which demonstrated thrombus within the AV access.  I was then able to cross the fistula using a Glidewire and a Kumpe catheter.  By hand-injection the central venous structures were also imaged and found to be widely patent.  4000 units of heparin was given and allowed to circulate as well.  An infusion catheter with a 20 cm infusion length with unselected was advanced over the Glidewire and 10 mg  of tPA was laced through out the thrombus of the cephalic vein.  This was allowed to dwell for 35 minutes.  The infusion catheter was then removed over a Glidewire and the sheath was then upsized to a 7 [place] sheath over the Glidewire.  A penumbra CAT 7D device was then advanced beginning distally and advancing toward the axilla, performing 4-5 passes.  Several passes were  made through the venous portion of the graft. Follow-up imaging now demonstrates the vast majority of the clot had been treated.  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  Inflation was to 10 to 12 atm for approximately 1 minute.  The residual strictures were greater than 50% in multiple locations, so I elected to stent the segment using a an 8 mm x 25 cm Viabahn stent which was postdilated with a 7 mm [place] balloon.  The more proximal segment was postdilated with a 6 mm Lutonix drug-eluting balloon to treat the proximal end of the stent.  Follow-up imaging now demonstrated that the cephalic vein was widely patent with rapid flow of contrast.  Repeat imaging of the central veins demonstrated they were widely patent as well.  A 4-0 Monocryl purse-string suture was sewn around both of the sheaths.  The sheaths were removed and light pressure was applied.  A sterile bandage was applied to the puncture site.  Right groin was then prepped and draped in sterile fashion.  Ultrasound was returned to the the field in a sterile sleeve.  The right common femoral vein was imaged and noted to be echolucent and compressible indicating patency.  Lidocaine was infiltrated in soft tissues and a Seldinger needle inserted under direct ultrasound visualization.  J-wire was then advanced followed by a dilator.  Then the 30 cm trialysis catheter was then advanced over the wire.  All 3 lumens were aspirated and flushed easily.  Catheter was secured to the skin of the thigh with the nylon suture and a sterile dressing including the Biopatch was applied.    COMPLICATIONS: none  CONDITION: improved  Renford Dills, M.D [facility] Office: [phone]  10/25/2022 4:25  PM

## 2022-10-25 NOTE — Progress Notes (Signed)
1424: Machine was alarming for high venous pressure. Lines were switched, flushed, and BFR was decreased . Patient stated that he wants to come off from Hemodialysis and wants to go back to his room repeatedly. Explanation was given to patient that it would be dangerous for him to cut his treatment and responded that he is aware and really wants to come off. Patient signed the waiver.   Attempted to give report to floor RN. Floor RN and Charge Nurse came to the unit to talk to patient. They also called the patient's daughter. Patient adamantly refuses to stay for his HD at first but then agreed to restart HD. Morphine was given by floor RN prior to restarting HD and patient agreed on that.

## 2022-10-25 NOTE — Discharge Planning (Signed)
Following for OPHD placement at [facility]. Records sent for review.  Dimas Chyle Dialysis Coordinator II  [facility] Cell: [phone] eFax: [phone] [email or web address]

## 2022-10-25 NOTE — Progress Notes (Signed)
Progress Note   Patient: Joshua Mora ZOX:[id number] DOB: 1950/05/28 DOA: 10/13/2022     12 DOS: the patient was seen and examined on 10/25/2022   Brief hospital course: Taken from prior notes.  Joshua Mora is a 72 y.o. male with medical history significant of alcohol abuse, tobacco use disorder, diet controlled prediabetes, CKD 3B, osteoarthritis s/p left knee replacement who presented to the ED with complaints of progressive weakness and frequent falls over the past 1 month.     On presentation he was found to have severely elevated creatinine above baseline for which nephrology was consulted.  Also with significant leukocytosis above 46,000 with concern for leukemoid reaction in the setting of presumptive UTI.  Trauma imaging were nonacute.  CT head also nonacute.  Hospital course was complicated by acute hypoxic respiratory failure.  Was found to have mild pulmonary edema on chest x-ray.  Elevated D-dimer above 11 with concern for possible pulmonary embolism.  Heparin drip initiated.  9/1: Overnight became febrile at 100.7.  Urine cultures with Klebsiella pneumonia which are sensitive to ceftriaxone.  Had bilateral knee injuries which does not appear infected.  Worsening leukocytosis.  VQ scan negative for PE, lower extremity venous Doppler negative for DVT, discontinuing heparin infusion.  Adding vancomycin to broaden up the coverage.  Also concern of Leukomed reaction versus any underlying undiagnosed leukemia-hematology was consulted. Also ordered labs to rule out DIC due to worsening D-dimer.  9/2: Some improvement in leukocytosis, hematology ordered few more labs, if he continued to have persistently elevated white cell count then he might need bone marrow for further evaluation. Also consulted orthopedic surgery to get their opinion on this recent left nondisplaced fibular neck fracture, slight worsening of creatinine but good UOP.  Nephrology would like to wait before  proceeding for another dialysis.  9/3: Renal function seems stable.  Preliminary left knee aspirate with no growth.  Improving leukocytosis.  ID was also consulted.  Can transfer to progressive now.  9/4: Slowly improving creatinine but patient remained elevated.  Nephrology to decide again tomorrow if he need another dialysis.  Improving leukocytosis, elevated kappa and lambda with normal ratio, rest of the labs pending.  ID is now on board and likely switch to p.o. antibiotics soon.  9/5: Some worsening confusion overnight with poor p.o. intake.  Renal function seems stable but GFR remains at 13-going for another dialysis today.  9/6: Vitals and renal function stable, unable to do HD yesterday due to mechanical issues with machine, plan to do hemodialysis today at [facility].  Potassium 5.7-Lokelma ordered.  Worsening leukocytosis, message sent to hematology. Ordered left ankle MRI due to worsening pain and swelling, x-ray was negative. Antibiotics were stopped yesterday.  9/7: Still feeling weak, renal function improved after getting dialysis yesterday, temperature catheter was removed and he will be monitor over the weekend.  Still oliguric.  Worsening leukocytosis.  Likely will get bone marrow biopsy on Monday-hematology placed orders. MRI of left ankle with complete tear of talofibular ligament and multiple other ligamental injuries-podiatry was consulted  9/8: Slight worsening of renal function with mild hyperkalemia.  Leukocytosis seems stable.  Podiatry is recommending conservative management of his ankle injuries with CAM boot for 4 to 6 weeks and outpatient follow-up.  Going for bone marrow biopsy tomorrow.  9/9: Renal functions and hyperkalemia with slight worsening.  Bone marrow biopsy is being rescheduled for tomorrow due to lab scheduling issues.  UOP of 1100, if continue to have worsening renal function  then nephrology will put permanent HD catheter tomorrow.  9/10: Renal function  seems stable but worsening hyperkalemia with potassium at 6.1 today.  Medical management ordered.  Temporary HD catheter was placed with femoral access and patient will be getting dialysis.  Also going for bone marrow biopsy    Assessment and Plan: * Acute renal failure (HCC) Patient with no prior diagnosis but came with significant AKI and oliguria on admission. History of regular NSAID use which can be contributory. Received 2 sessions of dialysis and then the initial temporary HD catheter was removed to monitor.  Currently stable renal function but worsening hyperkalemia. Another temporary catheter was placed by vascular today Patient will receive another session of dialysis Nephrology is on board and closely monitoring  Severe sepsis [facility]) Patient met sepsis criteria with tachycardia, severe leukocytosis and severe sepsis with lactic acidosis above 4.3.  Initial procalcitonin was 60.67 which is decreasing. Did develop a low-grade fever overnight again.  Procalcitonin improving.  Preliminary blood cultures negative, urine cultures with Klebsiella pneumonia which shows only resistance to ampicillin and Augmentin.  Patient did received ceftriaxone for the past 3 days.  Vancomycin was added on 10/16/2022.  Left knee aspirate with no growth so far. -ID consult -Completed the course of antibiotic and ID is advising monitoring without antibiotic for now. -Continue to monitor  Leukocytosis Some improvement today, pathologist smear review with immature granulocytes, initial smear review with bandemia.  Urine cultures with Klebsiella pneumonia but patient is being treated with appropriate antibiotics. Significantly elevated D-dimer which continued to get worse.  VTE has been ruled out with negative VQ scan and lower extremity venous Doppler. -Completed the course of antibiotic -Hematology consult-ordered some more labs, likely leukemoid reaction with severe inflammation and infection but if remain  elevated he will need bone marrow biopsy for further evaluation -Bone marrow biopsy today  Elevated d-dimer Patient with worsening D-dimer, now at 13.  COVID was negative. DVT has been ruled out with negative VQ scan and lower extremity venous Doppler. -Stop heparin infusion -Checking labs for DIC -Continue to monitor  Acute hypoxemic respiratory failure (HCC) Patient did develop acute hypoxic respiratory failure and repeat chest x-ray at that time with concern of pulmonary congestion.  Echocardiogram with normal EF and grade 1 diastolic dysfunction.  VQ scan negative for PE Patient with significant AKI, also concern of NSAID induced renal injury. Started on dialysis. -Continue IV Lasix -Continue supplemental oxygen-wean as tolerated  Frequent falls Patient with history of recent decline, poor appetite and multiple falls.  First fall was on July 4 and since then he was falling couple of times a week.  Left knee imaging done on 8/23 with a concern of nondisplaced fibular neck fracture, Initial plan was outpatient follow-up with orthopedic surgery. Requested orthopedic surgery to evaluate while he is in hospital B12 normal, significantly low vitamin D-started on supplement. PT/OT are recommending SNF  Nicotine dependence, uncomplicated -Nicotine patch  Traumatic rhabdomyolysis (HCC) Likely secondary to recurrent falls.  Hyperkalemia Potassium of 6.1 today, some worsening despite getting Lokelma, so it was switched with Veltassa but potassium continue to get worse. -Medical management -Will be going for another dialysis today -Continuing Lokelma and Veltassa for both for now -Continue to monitor  Left ankle pain Patient continued to have worsening pain and edema of left ankle prompted an MRI of left ankle as initial imaging was negative for any fracture. MRI with complete tear of talofibular ligament and multiple other ligamental injuries. -Podiatry was consulted-recommending  conservative  management with pain control and CAM boot for 4 to 6 weeks and outpatient follow-up -Continue with pain management      Subjective: Patient with no new concern today.  Going for 2 procedures followed by dialysis.  Physical Exam: Vitals:   10/25/22 1100 10/25/22 1115 10/25/22 1200 10/25/22 1222  BP: (!) 141/79 126/67 (!) 134/54 117/64  Pulse:    96  Resp: 17 13 11 16   Temp:    97.7 F (36.5 C)  TempSrc:      SpO2: 96% 96% 94% 92%  Weight:      Height:       General.  Frail and ill-appearing gentleman in no acute distress. Pulmonary.  Lungs clear bilaterally, normal respiratory effort. CV.  Regular rate and rhythm, no JVD, rub or murmur. Abdomen.  Soft, nontender, nondistended, BS positive. CNS.  Alert and oriented .  No focal neurologic deficit. Extremities.  No edema, no cyanosis, pulses intact and symmetrical.  LLE with Ace wrap Psychiatry.  Judgment and insight appears normal.   Data Reviewed: Prior data reviewed  Family Communication:   Disposition: Status is: Inpatient Remains inpatient appropriate because: Severity of illness  Planned Discharge Destination: Skilled nursing facility  DVT prophylaxis.  Subcu heparin Time spent: 50 minutes  This record has been created using Conservation officer, historic buildings. Errors have been sought and corrected,but may not always be located. Such creation errors do not reflect on the standard of care.   Author: Arnetha Courser, MD 10/25/2022 12:37 PM  For on call review [email or web address].

## 2022-10-25 NOTE — Progress Notes (Signed)
Patient clinically stable post IR BMB per Dr Juliette Alcide, vitals stable pre and post procedure. Arouses to voice post procedure/sedation. Received Versed 0.5 mg along with Fentanyl 50 mcg IV for procedure. Report given to [facility] RN post procedure/bedside/specials/12.

## 2022-10-25 NOTE — Consult Note (Signed)
Hospital Consult    Reason for Consult:  ESRD in need of temporary dialysis access.  Requesting Physician:  Malachi Carl NP  MRN #:  [id number]  History of Present Illness: This is a 72 y.o. male with medical history significant of osteoarthritis s/p left knee replacement came to ED with complaint of progressive weakness and frequent falls over the past 1 month. On work up patient was found to have a bun of 74 and a creatine of 6.30 and elevated WBC's of 32,000. Patients current Potassium is 6.1  Vascular Surgery was consulted for dialysis access due to progressive renal disease. Due to patients elevated WBC and BUN of 95 patient will need temporary dialysis catheter placed.   Past Medical History:  Diagnosis Date   Arthritis    Chest pain     Past Surgical History:  Procedure Laterality Date   COLONOSCOPY WITH PROPOFOL N/A 09/12/2016   Procedure: COLONOSCOPY WITH PROPOFOL;  Surgeon: Scot Jun, MD;  Location: [facility] ENDOSCOPY;  Service: Endoscopy;  Laterality: N/A;   COLONOSCOPY WITH PROPOFOL N/A 04/02/2020   Procedure: COLONOSCOPY WITH PROPOFOL;  Surgeon: Pasty Spillers, MD;  Location: [facility] ENDOSCOPY;  Service: Endoscopy;  Laterality: N/A;   JOINT REPLACEMENT     LT TKA 1995   ROTATOR CUFF REPAIR Left 2013   SHOULDER ARTHROSCOPY WITH ROTATOR CUFF REPAIR Right 11/15/2017   Procedure: SHOULDER ARTHROSCOPY WITH ROTATOR CUFF REPAIR;  Surgeon: Lyndle Herrlich, MD;  Location: [facility] ORS;  Service: Orthopedics;  Laterality: Right;    No Known Allergies  Prior to Admission medications   Medication Sig Start Date End Date Taking? Authorizing Provider  Acetaminophen Extra Strength 500 MG TABS Take 500-1,000 mg by mouth every 4 (four) hours as needed (Pain). 10/06/22  Yes [provider]  IBU 800 MG tablet Take 800 mg by mouth every 8 (eight) hours as needed. 10/06/22  Yes [provider]  meclizine (ANTIVERT) 25 MG tablet Take 25 mg by mouth every 6 (six) hours  as needed. 09/09/20  Yes [provider]  meloxicam (MOBIC) 15 MG tablet Take 15 mg by mouth daily. 09/09/20  Yes [provider]  traMADol (ULTRAM) 50 MG tablet Take 50 mg by mouth 2 (two) times daily as needed.   Yes [provider]  VIAGRA 100 MG tablet Take 100 mg by mouth as needed for erectile dysfunction. 09/09/20  Yes [provider]  vitamin B-12 (CYANOCOBALAMIN) 1000 MCG tablet Take 1,000 mcg by mouth daily. 09/09/20  Yes [provider]  HYDROcodone-acetaminophen (NORCO/VICODIN) 5-325 MG tablet Take 1 tablet by mouth every 6 (six) hours as needed. Patient not taking: Reported on 10/14/2022 08/19/22   Darrick Grinder, PA-C    Social History   Socioeconomic History   Marital status: Single    Spouse name: Not on file   Number of children: Not on file   Years of education: Not on file   Highest education level: Not on file  Occupational History   Not on file  Tobacco Use   Smoking status: Every Day    Types: Cigarettes, Cigars   Smokeless tobacco: Never   Tobacco comments:    5 per day  Vaping Use   Vaping status: Never Used  Substance and Sexual Activity   Alcohol use: Yes    Comment: occ   Drug use: No   Sexual activity: Not on file  Other Topics Concern   Not on file  Social History Narrative   Not on  file   Social Determinants of Health   Financial Resource Strain: Not on file  Food Insecurity: No Food Insecurity (10/13/2022)   Hunger Vital Sign    Worried About Running Out of Food in the Last Year: Never true    Ran Out of Food in the Last Year: Never true  Transportation Needs: No Transportation Needs (10/13/2022)   PRAPARE - Administrator, Civil Service (Medical): No    Lack of Transportation (Non-Medical): No  Physical Activity: Not on file  Stress: Not on file  Social Connections: Not on file  Intimate Partner Violence: Patient Unable To Answer (10/13/2022)   Humiliation, Afraid, Rape, and Kick  questionnaire    Fear of Current or Ex-Partner: Patient unable to answer    Emotionally Abused: Patient unable to answer    Physically Abused: Patient unable to answer    Sexually Abused: Patient unable to answer     History reviewed. No pertinent family history.  ROS: Otherwise negative unless mentioned in HPI  Physical Examination  Vitals:   10/25/22 0955 10/25/22 1000  BP: 120/60 104/60  Pulse: 94 96  Resp: 10 10  Temp:    SpO2: 92% 95%   Body mass index is 26.92 kg/m.  General:  WDWN in NAD Gait: Not observed HENT: WNL, normocephalic Pulmonary: normal non-labored breathing, without Rales, rhonchi,  wheezing Cardiac: regular, without  Murmurs, rubs or gallops; without carotid bruits Abdomen: Positive bowel sounds throughout, soft, NT/ND, no masses Skin: without rashes Vascular Exam/Pulses: Palpable pulses throughout. Extremities: without ischemic changes, without Gangrene , without cellulitis; without open wounds;  Musculoskeletal: no muscle wasting or atrophy  Neurologic: A&O X 3;  No focal weakness or paresthesias are detected; speech is fluent/normal Psychiatric:  The pt has Normal affect. Lymph:  Unremarkable  CBC    Component Value Date/Time   WBC 27.9 (H) 10/25/2022 0453   RBC 3.53 (L) 10/25/2022 0453   HGB 10.4 (L) 10/25/2022 0453   HCT 32.3 (L) 10/25/2022 0453   PLT 692 (H) 10/25/2022 0453   MCV 91.5 10/25/2022 0453   MCH 29.5 10/25/2022 0453   MCHC 32.2 10/25/2022 0453   RDW 16.5 (H) 10/25/2022 0453   LYMPHSABS 2.0 10/25/2022 0453   MONOABS 2.7 (H) 10/25/2022 0453   EOSABS 0.2 10/25/2022 0453   BASOSABS 0.1 10/25/2022 0453    BMET    Component Value Date/Time   NA 138 10/25/2022 0453   K 6.1 (H) 10/25/2022 0453   CL 105 10/25/2022 0453   CO2 24 10/25/2022 0453   GLUCOSE 112 (H) 10/25/2022 0453   BUN 95 (H) 10/25/2022 0453   CREATININE 3.47 (H) 10/25/2022 0453   CALCIUM 8.9 10/25/2022 0453   GFRNONAA 18 (L) 10/25/2022 0453     COAGS: Lab Results  Component Value Date   INR 1.4 (H) 10/16/2022   INR 1.5 (H) 10/14/2022   INR 1.3 (H) 10/13/2022     Non-Invasive Vascular Imaging:   None Ordered.   Statin:  No. Beta Blocker:  No. Aspirin:  No. ACEI:  No. ARB:  No. CCB use:  No Other antiplatelets/anticoagulants:  No.    ASSESSMENT/PLAN: This is a 72 y.o. male who has HX of ESRD now needing dialysis access. Patient WBC's are 27.9 this morning and BUN is 95. Therefore he will need a temp catheter placed for hemodialysis.  I discussed in detail with the patient the procedure, benefits, risks and complications. He verbalizes his understanding and would like to proceed as soon  as possible. I answered all his questions this morning. Patient has been NPO since midnight last night but does not have to be for this procedure.    -I discussed the plan in de tail with Dr Vilinda Flake MD and he agrees with the plan.    Marcie Bal [facility] 10/25/2022 10:06 AM

## 2022-10-25 NOTE — Progress Notes (Signed)
1440 Nurse made aware by dialysis nurse that pt is refusing dialysis and wants to "leave". Per dialysis nurse pt had only been running on the machine for 16 mins. HR elevated in 120's pt yelling and fussing to leave. Dr Nelson Chimes made aware and daughter [name]. Per DR Nelson Chimes Orders for 2mg  IV morphine to be given once down in dialysis as pt needs dialysis. This nurse seen pt bedside in dialysis unit informed pt of importance of dialysis treatment, Daughter called on phone and placed on speaker to speak with pt as well. Pt agrees to IV morphine and to start dialysis.   1501 2mg  IV morphine given at this time.

## 2022-10-25 NOTE — Progress Notes (Signed)
Hemodialysis note  Received patient in bed to unit. Alert and oriented.  Informed consent signed and in chart.   Treatment initiated: 1352 Treatment completed: 1424  Access used: Right femoral temp catheter Access issues: high venous pressure  Total UF removed: 0 Medication(s) given:  none   Wolfgang Phoenix [facility]

## 2022-10-25 NOTE — H&P (View-Only) (Signed)
Hospital Consult    Reason for Consult:  ESRD in need of temporary dialysis access.  Requesting Physician:  Malachi Carl NP  MRN #:  [id number]  History of Present Illness: This is a 72 y.o. male with medical history significant of osteoarthritis s/p left knee replacement came to ED with complaint of progressive weakness and frequent falls over the past 1 month. On work up patient was found to have a bun of 74 and a creatine of 6.30 and elevated WBC's of 32,000. Patients current Potassium is 6.1  Vascular Surgery was consulted for dialysis access due to progressive renal disease. Due to patients elevated WBC and BUN of 95 patient will need temporary dialysis catheter placed.   Past Medical History:  Diagnosis Date   Arthritis    Chest pain     Past Surgical History:  Procedure Laterality Date   COLONOSCOPY WITH PROPOFOL N/A 09/12/2016   Procedure: COLONOSCOPY WITH PROPOFOL;  Surgeon: Scot Jun, MD;  Location: [facility] ENDOSCOPY;  Service: Endoscopy;  Laterality: N/A;   COLONOSCOPY WITH PROPOFOL N/A 04/02/2020   Procedure: COLONOSCOPY WITH PROPOFOL;  Surgeon: Pasty Spillers, MD;  Location: [facility] ENDOSCOPY;  Service: Endoscopy;  Laterality: N/A;   JOINT REPLACEMENT     LT TKA 1995   ROTATOR CUFF REPAIR Left 2013   SHOULDER ARTHROSCOPY WITH ROTATOR CUFF REPAIR Right 11/15/2017   Procedure: SHOULDER ARTHROSCOPY WITH ROTATOR CUFF REPAIR;  Surgeon: Lyndle Herrlich, MD;  Location: [facility] ORS;  Service: Orthopedics;  Laterality: Right;    No Known Allergies  Prior to Admission medications   Medication Sig Start Date End Date Taking? Authorizing Provider  Acetaminophen Extra Strength 500 MG TABS Take 500-1,000 mg by mouth every 4 (four) hours as needed (Pain). 10/06/22  Yes [provider]  IBU 800 MG tablet Take 800 mg by mouth every 8 (eight) hours as needed. 10/06/22  Yes [provider]  meclizine (ANTIVERT) 25 MG tablet Take 25 mg by mouth every 6 (six) hours  as needed. 09/09/20  Yes [provider]  meloxicam (MOBIC) 15 MG tablet Take 15 mg by mouth daily. 09/09/20  Yes [provider]  traMADol (ULTRAM) 50 MG tablet Take 50 mg by mouth 2 (two) times daily as needed.   Yes [provider]  VIAGRA 100 MG tablet Take 100 mg by mouth as needed for erectile dysfunction. 09/09/20  Yes [provider]  vitamin B-12 (CYANOCOBALAMIN) 1000 MCG tablet Take 1,000 mcg by mouth daily. 09/09/20  Yes [provider]  HYDROcodone-acetaminophen (NORCO/VICODIN) 5-325 MG tablet Take 1 tablet by mouth every 6 (six) hours as needed. Patient not taking: Reported on 10/14/2022 08/19/22   Darrick Grinder, PA-C    Social History   Socioeconomic History   Marital status: Single    Spouse name: Not on file   Number of children: Not on file   Years of education: Not on file   Highest education level: Not on file  Occupational History   Not on file  Tobacco Use   Smoking status: Every Day    Types: Cigarettes, Cigars   Smokeless tobacco: Never   Tobacco comments:    5 per day  Vaping Use   Vaping status: Never Used  Substance and Sexual Activity   Alcohol use: Yes    Comment: occ   Drug use: No   Sexual activity: Not on file  Other Topics Concern   Not on file  Social History Narrative   Not on  file   Social Determinants of Health   Financial Resource Strain: Not on file  Food Insecurity: No Food Insecurity (10/13/2022)   Hunger Vital Sign    Worried About Running Out of Food in the Last Year: Never true    Ran Out of Food in the Last Year: Never true  Transportation Needs: No Transportation Needs (10/13/2022)   PRAPARE - Administrator, Civil Service (Medical): No    Lack of Transportation (Non-Medical): No  Physical Activity: Not on file  Stress: Not on file  Social Connections: Not on file  Intimate Partner Violence: Patient Unable To Answer (10/13/2022)   Humiliation, Afraid, Rape, and Kick  questionnaire    Fear of Current or Ex-Partner: Patient unable to answer    Emotionally Abused: Patient unable to answer    Physically Abused: Patient unable to answer    Sexually Abused: Patient unable to answer     History reviewed. No pertinent family history.  ROS: Otherwise negative unless mentioned in HPI  Physical Examination  Vitals:   10/25/22 0955 10/25/22 1000  BP: 120/60 104/60  Pulse: 94 96  Resp: 10 10  Temp:    SpO2: 92% 95%   Body mass index is 26.92 kg/m.  General:  WDWN in NAD Gait: Not observed HENT: WNL, normocephalic Pulmonary: normal non-labored breathing, without Rales, rhonchi,  wheezing Cardiac: regular, without  Murmurs, rubs or gallops; without carotid bruits Abdomen: Positive bowel sounds throughout, soft, NT/ND, no masses Skin: without rashes Vascular Exam/Pulses: Palpable pulses throughout. Extremities: without ischemic changes, without Gangrene , without cellulitis; without open wounds;  Musculoskeletal: no muscle wasting or atrophy  Neurologic: A&O X 3;  No focal weakness or paresthesias are detected; speech is fluent/normal Psychiatric:  The pt has Normal affect. Lymph:  Unremarkable  CBC    Component Value Date/Time   WBC 27.9 (H) 10/25/2022 0453   RBC 3.53 (L) 10/25/2022 0453   HGB 10.4 (L) 10/25/2022 0453   HCT 32.3 (L) 10/25/2022 0453   PLT 692 (H) 10/25/2022 0453   MCV 91.5 10/25/2022 0453   MCH 29.5 10/25/2022 0453   MCHC 32.2 10/25/2022 0453   RDW 16.5 (H) 10/25/2022 0453   LYMPHSABS 2.0 10/25/2022 0453   MONOABS 2.7 (H) 10/25/2022 0453   EOSABS 0.2 10/25/2022 0453   BASOSABS 0.1 10/25/2022 0453    BMET    Component Value Date/Time   NA 138 10/25/2022 0453   K 6.1 (H) 10/25/2022 0453   CL 105 10/25/2022 0453   CO2 24 10/25/2022 0453   GLUCOSE 112 (H) 10/25/2022 0453   BUN 95 (H) 10/25/2022 0453   CREATININE 3.47 (H) 10/25/2022 0453   CALCIUM 8.9 10/25/2022 0453   GFRNONAA 18 (L) 10/25/2022 0453     COAGS: Lab Results  Component Value Date   INR 1.4 (H) 10/16/2022   INR 1.5 (H) 10/14/2022   INR 1.3 (H) 10/13/2022     Non-Invasive Vascular Imaging:   None Ordered.   Statin:  No. Beta Blocker:  No. Aspirin:  No. ACEI:  No. ARB:  No. CCB use:  No Other antiplatelets/anticoagulants:  No.    ASSESSMENT/PLAN: This is a 72 y.o. male who has HX of ESRD now needing dialysis access. Patient WBC's are 27.9 this morning and BUN is 95. Therefore he will need a temp catheter placed for hemodialysis.  I discussed in detail with the patient the procedure, benefits, risks and complications. He verbalizes his understanding and would like to proceed as soon  as possible. I answered all his questions this morning. Patient has been NPO since midnight last night but does not have to be for this procedure.    -I discussed the plan in de tail with Dr Vilinda Flake MD and he agrees with the plan.    Marcie Bal [facility] 10/25/2022 10:06 AM

## 2022-10-25 NOTE — Progress Notes (Signed)
Hemodialysis Note  Received patient in bed to unit. Alert and oriented. Informed consent signed and in chart.   Treatment initiated:1345/1519 Treatment completed:1433/1812  Patient tolerated treatment. Transported back to the room alert, without acute distress. Report given to patient's RN.  Access used: Right Femoral Catheter  Access issues: High arterial pressure, reduced blood flow rate, secondary to patient movement and positioning.   Total UF removed:0 ml Medications given: Morphine  Post HD VS: Elevated HR, Hypotensive Post HD weight: Unobtained   Patient with shorten treatment due to circuit, behaviors, concerning vital signs. Patient with 1:36 hours left in treatment, began to disrobe, pull at lines, while his circuit displayed signs of clotting. Treatment was terminated, patient returned to room.   Wendie Simmer, RN [facility]

## 2022-10-25 NOTE — Progress Notes (Signed)
PT Cancellation Note  Patient Details Name: Joshua Mora MRN: [id number] DOB: 1950/04/19   Cancelled Treatment:     Pt off floor for temporary dialysis catheter placement. Will re-attempt next available date/time per POC.   Jannet Askew 10/25/2022, 11:34 AM

## 2022-10-25 NOTE — Interval H&P Note (Signed)
History and Physical Interval Note:  10/25/2022 11:29 AM  Joshua Mora  has presented today for surgery, with the diagnosis of ESRD.  The various methods of treatment have been discussed with the patient and family. After consideration of risks, benefits and other options for treatment, the patient has consented to  Procedure(s): TEMPORARY DIALYSIS CATHETER (N/A) as a surgical intervention.  The patient's history has been reviewed, patient examined, no change in status, stable for surgery.  I have reviewed the patient's chart and labs.  Questions were answered to the patient's satisfaction.     Levora Dredge

## 2022-10-26 ENCOUNTER — Other Ambulatory Visit: Payer: Self-pay

## 2022-10-26 DIAGNOSIS — J9601 Acute respiratory failure with hypoxia: Secondary | ICD-10-CM | POA: Diagnosis not present

## 2022-10-26 DIAGNOSIS — A419 Sepsis, unspecified organism: Secondary | ICD-10-CM | POA: Diagnosis not present

## 2022-10-26 DIAGNOSIS — D72825 Bandemia: Secondary | ICD-10-CM | POA: Diagnosis not present

## 2022-10-26 DIAGNOSIS — N179 Acute kidney failure, unspecified: Secondary | ICD-10-CM | POA: Diagnosis not present

## 2022-10-26 LAB — RENAL FUNCTION PANEL
Albumin: 2.2 g/dL — ABNORMAL LOW (ref 3.5–5.0)
Anion gap: 10 (ref 5–15)
BUN: 67 mg/dL — ABNORMAL HIGH (ref 8–23)
CO2: 25 mmol/L (ref 22–32)
Calcium: 8.2 mg/dL — ABNORMAL LOW (ref 8.9–10.3)
Chloride: 102 mmol/L (ref 98–111)
Creatinine, Ser: 2.77 mg/dL — ABNORMAL HIGH (ref 0.61–1.24)
GFR, Estimated: 23 mL/min — ABNORMAL LOW (ref >60)
Glucose, Bld: 102 mg/dL — ABNORMAL HIGH (ref 70–99)
Phosphorus: 5.5 mg/dL — ABNORMAL HIGH (ref 2.5–4.6)
Potassium: 5 mmol/L (ref 3.5–5.1)
Sodium: 137 mmol/L (ref 135–145)

## 2022-10-26 LAB — POTASSIUM
Potassium: 4.7 mmol/L (ref 3.5–5.1)
Potassium: 4.8 mmol/L (ref 3.5–5.1)
Potassium: 5.1 mmol/L (ref 3.5–5.1)

## 2022-10-26 NOTE — Progress Notes (Signed)
Progress Note   Patient: Joshua Mora YNW:[id number] DOB: 1951-01-06 DOA: 10/13/2022     13 DOS: the patient was seen and examined on 10/26/2022   Brief hospital course: Taken from prior notes.  Jdyn Resendiz is a 72 y.o. male with medical history significant of alcohol abuse, tobacco use disorder, diet controlled prediabetes, CKD 3B, osteoarthritis s/p left knee replacement who presented to the ED with complaints of progressive weakness and frequent falls over the past 1 month.     On presentation he was found to have severely elevated creatinine above baseline for which nephrology was consulted.  Also with significant leukocytosis above 46,000 with concern for leukemoid reaction in the setting of presumptive UTI.  Trauma imaging were nonacute.  CT head also nonacute.  Hospital course was complicated by acute hypoxic respiratory failure.  Was found to have mild pulmonary edema on chest x-ray.  Elevated D-dimer above 11 with concern for possible pulmonary embolism.  Heparin drip initiated.  9/1: Overnight became febrile at 100.7.  Urine cultures with Klebsiella pneumonia which are sensitive to ceftriaxone.  Had bilateral knee injuries which does not appear infected.  Worsening leukocytosis.  VQ scan negative for PE, lower extremity venous Doppler negative for DVT, discontinuing heparin infusion.  Adding vancomycin to broaden up the coverage.  Also concern of Leukomed reaction versus any underlying undiagnosed leukemia-hematology was consulted. Also ordered labs to rule out DIC due to worsening D-dimer.  9/2: Some improvement in leukocytosis, hematology ordered few more labs, if he continued to have persistently elevated white cell count then he might need bone marrow for further evaluation. Also consulted orthopedic surgery to get their opinion on this recent left nondisplaced fibular neck fracture, slight worsening of creatinine but good UOP.  Nephrology would like to wait before  proceeding for another dialysis.  9/3: Renal function seems stable.  Preliminary left knee aspirate with no growth.  Improving leukocytosis.  ID was also consulted.  Can transfer to progressive now.  9/4: Slowly improving creatinine but patient remained elevated.  Nephrology to decide again tomorrow if he need another dialysis.  Improving leukocytosis, elevated kappa and lambda with normal ratio, rest of the labs pending.  ID is now on board and likely switch to p.o. antibiotics soon.  9/5: Some worsening confusion overnight with poor p.o. intake.  Renal function seems stable but GFR remains at 13-going for another dialysis today.  9/6: Vitals and renal function stable, unable to do HD yesterday due to mechanical issues with machine, plan to do hemodialysis today at [facility].  Potassium 5.7-Lokelma ordered.  Worsening leukocytosis, message sent to hematology. Ordered left ankle MRI due to worsening pain and swelling, x-ray was negative. Antibiotics were stopped yesterday.  9/7: Still feeling weak, renal function improved after getting dialysis yesterday, temperature catheter was removed and he will be monitor over the weekend.  Still oliguric.  Worsening leukocytosis.  Likely will get bone marrow biopsy on Monday-hematology placed orders. MRI of left ankle with complete tear of talofibular ligament and multiple other ligamental injuries-podiatry was consulted  9/8: Slight worsening of renal function with mild hyperkalemia.  Leukocytosis seems stable.  Podiatry is recommending conservative management of his ankle injuries with CAM boot for 4 to 6 weeks and outpatient follow-up.  Going for bone marrow biopsy tomorrow.  9/9: Renal functions and hyperkalemia with slight worsening.  Bone marrow biopsy is being rescheduled for tomorrow due to lab scheduling issues.  UOP of 1100, if continue to have worsening renal function  then nephrology will put permanent HD catheter tomorrow.  9/10: Renal function  seems stable but worsening hyperkalemia with potassium at 6.1 today.  Medical management ordered.  Temporary HD catheter was placed with femoral access and patient will be getting dialysis.  Also going for bone marrow biopsy  9/11: Renal function with some improvement after getting partial dialysis yesterday.  Hyperkalemia resolved.  Patient was becoming very agitated and started pulling on dialysis catheter so it was terminated prematurely.  Pending bone marrow biopsy results. Palliative care was also consulted to discuss goals of care.   Assessment and Plan: * Acute renal failure (HCC) Patient with no prior diagnosis but came with significant AKI and oliguria on admission. History of regular NSAID use which can be contributory. Received 2 sessions of dialysis and then the initial temporary HD catheter was removed to monitor.  Currently stable renal function but worsening hyperkalemia. Another temporary catheter was placed by vascular on 9/10 and patient underwent a partial dialysis session which was terminated early due to agitation Patient will receive another session of dialysis Nephrology is on board and closely monitoring  Severe sepsis [facility]) Patient met sepsis criteria with tachycardia, severe leukocytosis and severe sepsis with lactic acidosis above 4.3.  Initial procalcitonin was 60.67 which is decreasing. Did develop a low-grade fever overnight again.  Procalcitonin improving.  Preliminary blood cultures negative, urine cultures with Klebsiella pneumonia which shows only resistance to ampicillin and Augmentin.  Patient did received ceftriaxone for the past 3 days.  Vancomycin was added on 10/16/2022.  Left knee aspirate with no growth so far. -ID consult -Completed the course of antibiotic and ID is advising monitoring without antibiotic for now. -Continue to monitor  Leukocytosis Some improvement today, pathologist smear review with immature granulocytes, initial smear review with  bandemia.  Urine cultures with Klebsiella pneumonia but patient is being treated with appropriate antibiotics. Significantly elevated D-dimer which continued to get worse.  VTE has been ruled out with negative VQ scan and lower extremity venous Doppler. -Completed the course of antibiotic -Hematology consult-ordered some more labs, likely leukemoid reaction with severe inflammation and infection but if remain elevated he will need bone marrow biopsy for further evaluation -Bone marrow biopsy done on 9/10-pending results  Elevated d-dimer Patient with worsening D-dimer, now at 13.  COVID was negative. DVT has been ruled out with negative VQ scan and lower extremity venous Doppler. -Stop heparin infusion -Checking labs for DIC -Continue to monitor  Acute hypoxemic respiratory failure (HCC) Patient did develop acute hypoxic respiratory failure and repeat chest x-ray at that time with concern of pulmonary congestion.  Echocardiogram with normal EF and grade 1 diastolic dysfunction.  VQ scan negative for PE Patient with significant AKI, also concern of NSAID induced renal injury. Started on dialysis. -Continue IV Lasix -Continue supplemental oxygen-wean as tolerated  Frequent falls Patient with history of recent decline, poor appetite and multiple falls.  First fall was on July 4 and since then he was falling couple of times a week.  Left knee imaging done on 8/23 with a concern of nondisplaced fibular neck fracture, Initial plan was outpatient follow-up with orthopedic surgery. Requested orthopedic surgery to evaluate while he is in hospital B12 normal, significantly low vitamin D-started on supplement. PT/OT are recommending SNF  Nicotine dependence, uncomplicated -Nicotine patch  Traumatic rhabdomyolysis (HCC) Likely secondary to recurrent falls.  Hyperkalemia Improved, some worsening despite getting Lokelma, so it was switched with Veltassa but potassium continue to get  worse. -Medical management -Will  be going for another dialysis today -Continuing Veltassa  -Continue to monitor  Left ankle pain Patient continued to have worsening pain and edema of left ankle prompted an MRI of left ankle as initial imaging was negative for any fracture. MRI with complete tear of talofibular ligament and multiple other ligamental injuries. -Podiatry was consulted-recommending conservative management with pain control and CAM boot for 4 to 6 weeks and outpatient follow-up -Continue with pain management      Subjective: Nursing concern of overnight agitation and pulling on wires.  Patient was comfortably resting during morning rounds, discussed his agitation and ask his wishes whether he wants to continue with dialysis or not.  Per patient he would like to continue with dialysis.  Physical Exam: Vitals:   10/25/22 1927 10/25/22 2140 10/26/22 0158 10/26/22 0607  BP: 102/78 (!) 101/55 105/65 (!) 117/54  Pulse: (!) 116 (!) 106 89 94  Resp: 17  18 18   Temp: 98.8 F (37.1 C) 97.9 F (36.6 C) 97.6 F (36.4 C) 98.4 F (36.9 C)  TempSrc: Oral     SpO2: 94% 96% 90% 97%  Weight:      Height:       General.  Frail elderly man, in no acute distress. Pulmonary.  Lungs clear bilaterally, normal respiratory effort. CV.  Regular rate and rhythm, no JVD, rub or murmur. Abdomen.  Soft, nontender, nondistended, BS positive. CNS.  Alert and oriented .  No focal neurologic deficit. Extremities.  No edema, no cyanosis, pulses intact and symmetrical.  LLE with Ace wrap Psychiatry.  Judgment and insight appears normal.  N Data Reviewed: Prior data reviewed  Family Communication: Discussed with daughter on phone  Disposition: Status is: Inpatient Remains inpatient appropriate because: Severity of illness  Planned Discharge Destination: Skilled nursing facility  DVT prophylaxis.  Subcu heparin Time spent: 45 minutes  This record has been created using Software engineer. Errors have been sought and corrected,but may not always be located. Such creation errors do not reflect on the standard of care.   Author: Arnetha Courser, MD 10/26/2022 2:54 PM  For on call review [email or web address].

## 2022-10-26 NOTE — Progress Notes (Signed)
[facility], [place] 10/26/22  Subjective:   Hospital day # 13  Patient seen sitting up in bed No family present at bedside Patient remains irritable Awaiting breakfast  Potassium 5.0  Renal: 09/10 0701 - 09/11 0700 In: -  Out: 450 [Urine:450] Lab Results  Component Value Date   CREATININE 2.77 (H) 10/26/2022   CREATININE 3.50 (H) 10/25/2022   CREATININE 3.47 (H) 10/25/2022     Objective:  Vital signs in last 24 hours:  Temp:  [97.6 F (36.4 C)-98.8 F (37.1 C)] 98.4 F (36.9 C) (09/11 0607) Pulse Rate:  [49-134] 94 (09/11 0607) Resp:  [11-37] 18 (09/11 0607) BP: (83-144)/(51-84) 117/54 (09/11 0607) SpO2:  [88 %-100 %] 97 % (09/11 0607) Weight:  [78.9 kg] 78.9 kg (09/10 1341)  Weight change: -3.8 kg Filed Weights   10/21/22 1810 10/25/22 0500 10/25/22 1341  Weight: 82.6 kg 82.7 kg 78.9 kg    Intake/Output:    Intake/Output Summary (Last 24 hours) at 10/26/2022 1030 Last data filed at 10/26/2022 0802 Gross per 24 hour  Intake --  Output 550 ml  Net -550 ml     Physical Exam: General: Critically ill-appearing  HEENT Moist oral mucous membranes  Pulm/lungs Clear, Barry O2  CVS/Heart Irregular  Abdomen:  Soft, mildly distended, nontender  Extremities: Dependent edema present  Neurologic: Irritable   Skin: Warm, LLE bruising  Access: None       Basic Metabolic Panel:  Recent Labs  Lab 10/23/22 0955 10/24/22 0423 10/25/22 0453 10/25/22 1258 10/25/22 1615 10/25/22 1945 10/26/22 0014 10/26/22 0528  NA 136 136 138 138  --   --   --  137  K 5.2* 5.5* 6.1* 6.2* 4.6 4.8 5.1 5.0  CL 101 99 105 104  --   --   --  102  CO2 25 24 24 22   --   --   --  25  GLUCOSE 93 120* 112* 106*  --   --   --  102*  BUN 77* 88* 95* 92*  --   --   --  67*  CREATININE 3.38* 3.73* 3.47* 3.50*  --   --   --  2.77*  CALCIUM 8.1* 8.3* 8.9 8.7*  --   --   --  8.2*  PHOS 5.8* 6.8* 5.8* 6.4*  --   --   --  5.5*     CBC: Recent Labs  Lab  10/20/22 1227 10/21/22 0352 10/22/22 0834 10/23/22 0955 10/24/22 0423 10/25/22 0453  WBC 33.6* 36.3* 37.9* 35.3* 29.8* 27.9*  NEUTROABS 26.9*  --  30.4* 29.1* 23.8* 22.6*  HGB 10.9* 10.9* 10.7* 10.4* 10.2* 10.4*  HCT 30.8* 32.1* 30.9* 31.3* 29.8* 32.3*  MCV 86.5 88.2 86.8 89.2 88.2 91.5  PLT 349 421* 492* 560* 597* 692*      Lab Results  Component Value Date   HEPBSAG NON REACTIVE 10/15/2022   HEPBIGM NON REACTIVE 10/15/2022      Microbiology:  Recent Results (from the past 240 hour(s))  Body fluid culture w Gram Stain     Status: None   Collection Time: 10/17/22  2:02 PM   Specimen: Synovium; Body Fluid  Result Value Ref Range Status   Specimen Description   Final    SYNOVIAL Performed at [facility], [address] [place], [place] [address]    Special Requests   Final    Normal Performed at [facility], [address] [place] [place], [place] [address]  Gram Stain   Final    FEW WBC PRESENT,BOTH PMN AND MONONUCLEAR NO ORGANISMS SEEN    Culture   Final    NO GROWTH 3 DAYS Performed at [facility], [address], [place], [place] [address]    Report Status 10/21/2022 FINAL  Final    Coagulation Studies: No results for input(s): "LABPROT", "INR" in the last 72 hours.   Urinalysis: No results for input(s): "COLORURINE", "LABSPEC", "PHURINE", "GLUCOSEU", "HGBUR", "BILIRUBINUR", "KETONESUR", "PROTEINUR", "UROBILINOGEN", "NITRITE", "LEUKOCYTESUR" in the last 72 hours.  Invalid input(s): "APPERANCEUR"     Imaging: PERIPHERAL VASCULAR CATHETERIZATION  Result Date: 10/25/2022 See surgical note for result.  IR BONE MARROW BIOPSY & ASPIRATION  Result Date: 10/25/2022 INDICATION: Hematologic abnormality EXAM: Bone marrow aspiration and core biopsy using fluoroscopic guidance MEDICATIONS: None. ANESTHESIA/SEDATION: Moderate (conscious) sedation was employed during this procedure. A total of Versed 0.5 mg and Fentanyl 50 mcg was  administered intravenously. Moderate Sedation Time: 16 minutes. The patient's level of consciousness and vital signs were monitored continuously by radiology nursing throughout the procedure under my direct supervision. FLUOROSCOPY TIME:  Fluoroscopy Time: 1.0 minutes (29 mGy) COMPLICATIONS: None immediate. PROCEDURE: Informed written consent was obtained from the patient after a thorough discussion of the procedural risks, benefits and alternatives. All questions were addressed. Maximal Sterile Barrier Technique was utilized including caps, mask, sterile gowns, sterile gloves, sterile drape, hand hygiene and skin antiseptic. A timeout was performed prior to the initiation of the procedure. The patient was placed prone on the exam table. Limited fluoroscopy of the pelvis was performed for planning purposes. Skin entry site was marked, and the overlying skin was prepped and draped in the standard sterile fashion. Local analgesia was obtained with 1% lidocaine. Using fluoroscopic guidance, an 11 gauge needle was advanced just deep to the cortex of the right posterior ilium. Subsequently, bone marrow aspiration and core biopsy were performed. Specimens were submitted to lab/pathology for handling. Hemostasis was achieved with manual pressure, and a clean dressing was placed. The patient tolerated the procedure well without immediate complication. IMPRESSION: Successful bone marrow aspiration and core biopsy of the right posterior ilium using fluoroscopic guidance. Electronically Signed   By: Olive Bass M.D.   On: 10/25/2022 11:21     Medications:    sodium chloride     calcium gluconate     sodium chloride       (feeding supplement) PROSource Plus  30 mL Oral BID BM   vitamin C  500 mg Oral BID   Chlorhexidine Gluconate Cloth  6 each Topical Daily   Chlorhexidine Gluconate Cloth  6 each Topical Q0600   cyanocobalamin  1,000 mcg Oral Daily   insulin aspart  10 Units Intravenous Once   And   dextrose   1 ampule Intravenous Once   feeding supplement (NEPRO CARB STEADY)  237 mL Oral TID BM   folic acid  1 mg Oral Daily   heparin injection (subcutaneous)  5,000 Units Subcutaneous Q8H   multivitamin  1 tablet Oral QHS   nicotine  14 mg Transdermal Daily   patiromer  16.8 g Oral Daily   sodium zirconium cyclosilicate  10 g Oral Daily   thiamine  100 mg Oral Daily   Or   thiamine  100 mg Intravenous Daily   Vitamin D (Ergocalciferol)  50,000 Units Oral Q7 days   acetaminophen, alteplase, alteplase, chlorproMAZINE, heparin, melatonin, morphine injection, ondansetron **OR** ondansetron (ZOFRAN) IV, mouth rinse, polyethylene glycol, prochlorperazine, traMADol, traZODone  Assessment/ Plan:  72 y.o. male with  tobacco use, alcohol abuse, osteoarthritis status post left knee replacement and hyperlipidemia, frequent falls over the past few weeks since July 4 th admitted on 10/13/2022 for AKI (acute kidney injury) [facility]) [N17.9]   Acute Kidney Injury on chronic kidney disease stage IIIB   Baseline creatinine of 1.74, GFR of 41 on 08/18/22 Suspect patient's acute kidney injury secondary to NSAID induced nephropathy and concurrent sepsis. No history of diabetes or hypertension per patient. Renal ultrasound with no obstruction. No IV contrast. -Underwent first hemodialysis treatment on 10/15/2022, last treatment completed on 10/21/22.   - Dialysis restarted on 10/25/22 due to elevated BUN and decreased urine output - Received dialysis yesterday, became very agitated during treatment. Initially signed off AMA, but was given pain medication, treatment restarted but stopped after 45 minutes. HD RN reports patient was very agitated and pulling at lines  - Next treatment scheduled for Thursday. - Renal navigator seeking outpatient dialysis clinic.  Sepsis Urinary source-urine culture from 10/14/2022 is growing Klebsiella. Completed iv antibiotics  Hyperkalemia Potassium 4.7, corrected with dialysis   LOS:  [facility] 9/11/202410:30 AM  [facility] [place], [place] [phone]

## 2022-10-26 NOTE — Discharge Planning (Signed)
PLACEMENT RESOLVED  Outpatient Facility [facility]  [address] [place] [address] [phone]  Schedule: MWF 11:15am  First treatment: Monday 9/16 10:45am  Dimas Chyle Dialysis Coordinator II  [facility] Cell: [phone] eFax: [phone] [email or web address]

## 2022-10-26 NOTE — TOC Progression Note (Addendum)
Transition of Care [facility]) - Progression Note    Patient Details  Name: Joshua Mora MRN: [id number] Date of Birth: 1950-07-15  Transition of Care [facility]) CM/SW Contact  Marlowe Sax, RN Phone Number: 10/26/2022, 11:44 AM  Clinical Narrative:    No bed offers at this time, resent out the bedsearch looking for a STR bed  Dialysis coordinator requested help for the daughter to do the HCPOA I notified the unit secretary and asked that they have the chaplain help  I let the daughter know that I have resent the bedsearch   Barriers to Discharge: Continued Medical Work up  Expected Discharge Plan and Services     Post Acute Care Choice: Home Health, Skilled Nursing Facility Living arrangements for the past 2 months: Single Family Home                                       Social Determinants of Health (SDOH) Interventions SDOH Screenings   Food Insecurity: No Food Insecurity (10/13/2022)  Housing: Low Risk  (10/13/2022)  Transportation Needs: No Transportation Needs (10/13/2022)  Utilities: Not At Risk (10/13/2022)  Tobacco Use: High Risk (10/13/2022)    Readmission Risk Interventions     No data to display

## 2022-10-26 NOTE — H&P (View-Only) (Signed)
Progress Note    10/26/2022 1:25 PM 1 Day Post-Op  Subjective: Joshua Mora is a 72 year old male now status postop day #1 from temporary dialysis catheter placement.  Patient is resting comfortably in bed.  Patient appears to be more alert and oriented this morning but also agitated.  No family members were present at the bedside.  Discussion with nephrology is the patient will need a permacath dialysis catheter for hemodialysis as outpatient.  Vascular surgery will plan on catheter placement later this week.   Vitals:   10/26/22 0158 10/26/22 0607  BP: 105/65 (!) 117/54  Pulse: 89 94  Resp: 18 18  Temp: 97.6 F (36.4 C) 98.4 F (36.9 C)  SpO2: 90% 97%   Physical Exam: Cardiac:  RRR, normal S1 and S2.  No murmurs appreciated. Lungs: On auscultation all lung fields are clear but diminished in the bases.  Normal nonlabored breathing without rales, rhonchi or wheezing. Incisions: Right groin incision with temporary dialysis catheter in place.  No hematoma seroma to note.  Dressing is clean dry and intact. Extremities: Palpable pulses throughout. Abdomen: Positive bowel sounds throughout, soft, nontender and nondistended. Neurologic: Alert and oriented x 1, patient appears to be more alert than yesterday but agitated.  CBC    Component Value Date/Time   WBC 27.9 (H) 10/25/2022 0453   RBC 3.53 (L) 10/25/2022 0453   HGB 10.4 (L) 10/25/2022 0453   HCT 32.3 (L) 10/25/2022 0453   PLT 692 (H) 10/25/2022 0453   MCV 91.5 10/25/2022 0453   MCH 29.5 10/25/2022 0453   MCHC 32.2 10/25/2022 0453   RDW 16.5 (H) 10/25/2022 0453   LYMPHSABS 2.0 10/25/2022 0453   MONOABS 2.7 (H) 10/25/2022 0453   EOSABS 0.2 10/25/2022 0453   BASOSABS 0.1 10/25/2022 0453    BMET    Component Value Date/Time   NA 137 10/26/2022 0528   K 4.7 10/26/2022 1008   CL 102 10/26/2022 0528   CO2 25 10/26/2022 0528   GLUCOSE 102 (H) 10/26/2022 0528   BUN 67 (H) 10/26/2022 0528   CREATININE 2.77 (H)  10/26/2022 0528   CALCIUM 8.2 (L) 10/26/2022 0528   GFRNONAA 23 (L) 10/26/2022 0528    INR    Component Value Date/Time   INR 1.4 (H) 10/16/2022 1347     Intake/Output Summary (Last 24 hours) at 10/26/2022 1325 Last data filed at 10/26/2022 1301 Gross per 24 hour  Intake --  Output 650 ml  Net -650 ml     Assessment/Plan:  72 y.o. male is s/p temporary dialysis catheter in right femoral vein.  1 Day Post-Op   PLAN: Dialysis Perma Catheter placement on Friday 10/28/22, pending the official read on the patient bone marrow biopsy from yesterday 10/25/22. If the bone marrow biopsy is non malignant and the source of the elevated WBC's are infectious, placement of the Perma Catheter may have to be delayed.    I discussed in detail with the patient and son the procedure, benefits, risks, and complications.  Patient verbalizes understanding and would like to proceed.  I answered all the patient's questions today.  Patient will be made n.p.o. after midnight on 10/26/2022 for his procedure later in the day.   DVT prophylaxis:  Heparin With Dialysis.   I discussed the plan in detail with Dr. Levora Dredge MD and he is in agreement with the plan.     Marcie Bal [facility] 10/26/2022 1:25 PM

## 2022-10-26 NOTE — Progress Notes (Signed)
  Progress Note    10/26/2022 1:25 PM 1 Day Post-Op  Subjective: Joshua Mora is a 72 year old male now status postop day #1 from temporary dialysis catheter placement.  Patient is resting comfortably in bed.  Patient appears to be more alert and oriented this morning but also agitated.  No family members were present at the bedside.  Discussion with nephrology is the patient will need a permacath dialysis catheter for hemodialysis as outpatient.  Vascular surgery will plan on catheter placement later this week.   Vitals:   10/26/22 0158 10/26/22 0607  BP: 105/65 (!) 117/54  Pulse: 89 94  Resp: 18 18  Temp: 97.6 F (36.4 C) 98.4 F (36.9 C)  SpO2: 90% 97%   Physical Exam: Cardiac:  RRR, normal S1 and S2.  No murmurs appreciated. Lungs: On auscultation all lung fields are clear but diminished in the bases.  Normal nonlabored breathing without rales, rhonchi or wheezing. Incisions: Right groin incision with temporary dialysis catheter in place.  No hematoma seroma to note.  Dressing is clean dry and intact. Extremities: Palpable pulses throughout. Abdomen: Positive bowel sounds throughout, soft, nontender and nondistended. Neurologic: Alert and oriented x 1, patient appears to be more alert than yesterday but agitated.  CBC    Component Value Date/Time   WBC 27.9 (H) 10/25/2022 0453   RBC 3.53 (L) 10/25/2022 0453   HGB 10.4 (L) 10/25/2022 0453   HCT 32.3 (L) 10/25/2022 0453   PLT 692 (H) 10/25/2022 0453   MCV 91.5 10/25/2022 0453   MCH 29.5 10/25/2022 0453   MCHC 32.2 10/25/2022 0453   RDW 16.5 (H) 10/25/2022 0453   LYMPHSABS 2.0 10/25/2022 0453   MONOABS 2.7 (H) 10/25/2022 0453   EOSABS 0.2 10/25/2022 0453   BASOSABS 0.1 10/25/2022 0453    BMET    Component Value Date/Time   NA 137 10/26/2022 0528   K 4.7 10/26/2022 1008   CL 102 10/26/2022 0528   CO2 25 10/26/2022 0528   GLUCOSE 102 (H) 10/26/2022 0528   BUN 67 (H) 10/26/2022 0528   CREATININE 2.77 (H)  10/26/2022 0528   CALCIUM 8.2 (L) 10/26/2022 0528   GFRNONAA 23 (L) 10/26/2022 0528    INR    Component Value Date/Time   INR 1.4 (H) 10/16/2022 1347     Intake/Output Summary (Last 24 hours) at 10/26/2022 1325 Last data filed at 10/26/2022 1301 Gross per 24 hour  Intake --  Output 650 ml  Net -650 ml     Assessment/Plan:  72 y.o. male is s/p temporary dialysis catheter in right femoral vein.  1 Day Post-Op   PLAN: Dialysis Perma Catheter placement on Friday 10/28/22, pending the official read on the patient bone marrow biopsy from yesterday 10/25/22. If the bone marrow biopsy is non malignant and the source of the elevated WBC's are infectious, placement of the Perma Catheter may have to be delayed.    I discussed in detail with the patient and son the procedure, benefits, risks, and complications.  Patient verbalizes understanding and would like to proceed.  I answered all the patient's questions today.  Patient will be made n.p.o. after midnight on 10/26/2022 for his procedure later in the day.   DVT prophylaxis:  Heparin With Dialysis.   I discussed the plan in detail with Dr. Levora Dredge MD and he is in agreement with the plan.     Marcie Bal [facility] 10/26/2022 1:25 PM

## 2022-10-27 ENCOUNTER — Encounter: Payer: Self-pay | Admitting: Vascular Surgery

## 2022-10-27 DIAGNOSIS — R531 Weakness: Secondary | ICD-10-CM | POA: Diagnosis not present

## 2022-10-27 DIAGNOSIS — R296 Repeated falls: Secondary | ICD-10-CM | POA: Diagnosis not present

## 2022-10-27 DIAGNOSIS — A419 Sepsis, unspecified organism: Secondary | ICD-10-CM | POA: Diagnosis not present

## 2022-10-27 DIAGNOSIS — N179 Acute kidney failure, unspecified: Secondary | ICD-10-CM | POA: Diagnosis not present

## 2022-10-27 DIAGNOSIS — D72825 Bandemia: Secondary | ICD-10-CM | POA: Diagnosis not present

## 2022-10-27 DIAGNOSIS — Z515 Encounter for palliative care: Secondary | ICD-10-CM

## 2022-10-27 DIAGNOSIS — J9601 Acute respiratory failure with hypoxia: Secondary | ICD-10-CM | POA: Diagnosis not present

## 2022-10-27 DIAGNOSIS — D72829 Elevated white blood cell count, unspecified: Secondary | ICD-10-CM | POA: Diagnosis not present

## 2022-10-27 LAB — CBC WITH DIFFERENTIAL/PLATELET
Abs Immature Granulocytes: 0.15 10*3/uL — ABNORMAL HIGH (ref 0.00–0.07)
Basophils Absolute: 0.1 10*3/uL (ref 0.0–0.1)
Basophils Relative: 0 %
Eosinophils Absolute: 0.2 10*3/uL (ref 0.0–0.5)
Eosinophils Relative: 1 %
HCT: 26.7 % — ABNORMAL LOW (ref 39.0–52.0)
Hemoglobin: 9 g/dL — ABNORMAL LOW (ref 13.0–17.0)
Immature Granulocytes: 1 %
Lymphocytes Relative: 10 %
Lymphs Abs: 2.3 10*3/uL (ref 0.7–4.0)
MCH: 30.1 pg (ref 26.0–34.0)
MCHC: 33.7 g/dL (ref 30.0–36.0)
MCV: 89.3 fL (ref 80.0–100.0)
Monocytes Absolute: 2.6 10*3/uL — ABNORMAL HIGH (ref 0.1–1.0)
Monocytes Relative: 11 %
Neutro Abs: 17.8 10*3/uL — ABNORMAL HIGH (ref 1.7–7.7)
Neutrophils Relative %: 77 %
Platelets: 463 10*3/uL — ABNORMAL HIGH (ref 150–400)
RBC: 2.99 MIL/uL — ABNORMAL LOW (ref 4.22–5.81)
RDW: 16.3 % — ABNORMAL HIGH (ref 11.5–15.5)
WBC: 23 10*3/uL — ABNORMAL HIGH (ref 4.0–10.5)
nRBC: 0 % (ref 0.0–0.2)

## 2022-10-27 LAB — RENAL FUNCTION PANEL
Albumin: 2.2 g/dL — ABNORMAL LOW (ref 3.5–5.0)
Anion gap: 13 (ref 5–15)
BUN: 79 mg/dL — ABNORMAL HIGH (ref 8–23)
CO2: 24 mmol/L (ref 22–32)
Calcium: 8.5 mg/dL — ABNORMAL LOW (ref 8.9–10.3)
Chloride: 101 mmol/L (ref 98–111)
Creatinine, Ser: 3.12 mg/dL — ABNORMAL HIGH (ref 0.61–1.24)
GFR, Estimated: 20 mL/min — ABNORMAL LOW (ref >60)
Glucose, Bld: 119 mg/dL — ABNORMAL HIGH (ref 70–99)
Phosphorus: 6 mg/dL — ABNORMAL HIGH (ref 2.5–4.6)
Potassium: 4.8 mmol/L (ref 3.5–5.1)
Sodium: 138 mmol/L (ref 135–145)

## 2022-10-27 LAB — SURGICAL PATHOLOGY

## 2022-10-27 MED ORDER — HEPARIN SODIUM (PORCINE) 1000 UNIT/ML IJ SOLN
INTRAMUSCULAR | Status: AC
  Filled 2022-10-27: qty 10

## 2022-10-27 MED ORDER — MIDODRINE HCL 5 MG PO TABS
10.0000 mg | ORAL_TABLET | Freq: Once | ORAL | Status: AC
  Administered 2022-10-27: 10 mg via ORAL

## 2022-10-27 MED ORDER — ALTEPLASE 2 MG IJ SOLR
INTRAMUSCULAR | Status: AC
  Filled 2022-10-27: qty 4

## 2022-10-27 MED ORDER — MIDODRINE HCL 5 MG PO TABS
ORAL_TABLET | ORAL | Status: AC
  Filled 2022-10-27: qty 2

## 2022-10-27 MED ORDER — ONDANSETRON HCL 4 MG/2ML IJ SOLN
INTRAMUSCULAR | Status: AC
  Filled 2022-10-27: qty 2

## 2022-10-27 MED ORDER — STERILE WATER FOR INJECTION IJ SOLN
INTRAMUSCULAR | Status: AC
  Filled 2022-10-27: qty 10

## 2022-10-27 NOTE — Progress Notes (Signed)
   10/27/22 0900  Spiritual Encounters  Type of Visit Initial  Care provided to: Pt not available  Referral source Patient request  Reason for visit Advance directives  OnCall Visit No   Patient currently at dialysis. Advanced directive paperwork left in room. Staff aware to contact Chaplain when patient is ready to proceed.

## 2022-10-27 NOTE — Progress Notes (Signed)
     Referral received for Joshua Mora re: goals of care discussion. Chart reviewed.  I attempted to assess patient in his room.  He was off the floor to dialysis so I attempted to speak with patient is dialysis unit.    Updates received from dialysis nursing staff that they are having difficulty keeping HD access at this time.  RN working to complete dialysis treatment and it is not an appropriate time to engage in goals of care discussions with patient.  PMT will re-attempt to meet with patient at later time today, 9/12.   Thank you for your referral and allowing PMT to assist in Joshua Mora's care.   Georgiann Cocker, FNP-BC Palliative Medicine Team   NO CHARGE

## 2022-10-27 NOTE — Progress Notes (Signed)
PT Cancellation Note  Patient Details Name: [name] MRN: [id number] DOB: 01/04/1951   Cancelled Treatment:     Pt off floor at dialysis since earlier this morning. MEWS at a 5 down to level 4 at this time. Will re-attempt if time permits, plan for permanent dialysis cath placement tomorrow.    Jannet Askew 10/27/2022, 2:19 PM

## 2022-10-27 NOTE — Progress Notes (Signed)
OT Cancellation Note  Patient Details Name: Chad Hoerig MRN: [id number] DOB: 03-05-1950   Cancelled Treatment:    Reason Eval/Treat Not Completed: Patient at procedure or test/ unavailable. Pt currently off the floor for HD treatment. OT will re-attempt when pt is next available.  Jackquline Denmark, MS, OTR/L , CBIS ascom [phone]  10/27/22, 1:52 PM

## 2022-10-27 NOTE — Consult Note (Addendum)
Consultation Note Date: 10/27/2022 at 1150  Patient Name: Joshua Mora  DOB: [birth year]  MRN: [id number]  Age / Sex: 72 y.o., male  PCP: Sandrea Hughs, NP Referring Physician: Arnetha Courser, MD  Reason for Consultation: Establishing goals of care  HPI/Patient Profile: 72 y.o. male  with past medical history of CKD (3b), etoh and tobacco use, preDM, and OA s/p L TKA admitted on 10/13/2022 with weakness and falls X 1 month.   Clinical Assessment and Goals of Care:  Referral received for Ala Dach re: goals of care discussion. Chart reviewed.  Extensive chart review completed prior to meeting patient including labs, vital signs, imaging, progress notes, orders, and available advanced directive documents from current and previous encounters.   I attempted to assess patient in his room.  He was off the floor to dialysis so I attempted to speak with patient is dialysis unit.    Updates received from dialysis nursing staff that they are having difficulty keeping HD access at this time.  Patient alert, oriented, acknowledged my presence, and was able to make his wishes known. However, it was not an appropriate time to engage in goals of care discussions with patient as RN was working to complete dialysis treatment with catheter clotting issue.   After assessing the patient, I spoke with his daughter [name] over the phone to discuss diagnosis prognosis, GOC, EOL wishes, disposition and options.  I introduced Palliative Medicine as specialized medical care for people living with serious illness. It focuses on providing relief from the symptoms and stress of a serious illness. The goal is to improve quality of life for both the patient and the family.  We discussed a brief life review of the patient.  Patient is divorced and has 3 children.  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  As far as functional and nutritional status [name] endorses patient had independence with all IADLs.  He was able to drive, cook his own meals, and live alone without issue.  Unfortunately, patient experienced falls recently and family was not able to get a full picture of patient's medical status PTA.  [name] shares patient is an independent  man and would not always share full details of what was going on with him.  We discussed patient's current illness and what it means in the larger context of patient's on-going co-morbidities.  AKI, bone marrow biopsy, and HD discussed.   I attempted to elicit values and goals of care important to the patient. [name] endorses that she has been encouraging her father to accept HD so that a clear plan of care can be established. She also reiterates that she and her siblings may want something different for their father, but that [name] has been encouraging of her family to follow and support what her father wants.    [name] shares concern that patient can no longer walk or transfer safely.  She is hopeful he will be agreeable to short-term rehab so  that he can can some mobility and a safe return to home with the right support in place can be planned.  Advance directives, concepts specific to code status, artificial feeding and hydration, and rehospitalization were considered and discussed. Full code and DNR/DNI discussed in detail. [name] shares patient might be willing to re-address code status and goals when she is present and when he is in the right state of mind. Discussed with [name] the importance of continued conversation with family and the medical providers regarding overall plan of care and treatment options, ensuring decisions are within the context of the patient's values and GOCs.    Family is facing treatment option decisions, advanced directive, and anticipatory care needs. [name] working with spiritual care to create AD.    [name] and I agreed to speak again tomorrow and hopefully meet bedside with her father to continue GOC discussions.   PMT will continue to follow.   Primary Decision Maker PATIENT  Physical Exam Vitals reviewed.  Constitutional:      General: He is not in acute distress.    Appearance: He is normal weight.  HENT:     Head: Normocephalic.     Mouth/Throat:     Mouth: Mucous membranes are moist.  Eyes:     Pupils: Pupils are equal, round, and reactive to light.  Pulmonary:     Effort: Pulmonary effort is normal.  Abdominal:     Palpations: Abdomen is soft.  Musculoskeletal:     Comments: Generalized weakness  Skin:    General: Skin is warm and dry.  Neurological:     Mental Status: He is alert and oriented to person, place, and time.  Psychiatric:        Behavior: Behavior normal.     Palliative Assessment/Data: 40%     Thank you for this consult. Palliative medicine will continue to follow and assist holistically.   Time Total: 75 minutes  Signed by: Georgiann Cocker, DNP, FNP-BC Palliative Medicine    Please contact Palliative Medicine Team phone at [phone] for questions and concerns.  For individual provider: See Loretha Stapler

## 2022-10-27 NOTE — Progress Notes (Signed)
  Received patient in bed to unit.   Informed consent signed and in chart.    TX duration: 1 hr 45 mins     Hand-off given to patient's nurse.    Access used: Femoral CVC Access issues: High AP, tried reversing lines 3x,  used cathflo for 1 hr on both ports, and reset up cartridge 2x. Called Dr. Cherylann Ratel Who ordered to end tx and said he will consult vascular.   Total UF removed: -958 mL  (958 mL NS Given d/t being unable to pull off prime fluid) Medication(s) given: Midodrine Post HD VS: WDL Post HD weight: 80.7 kg      [facility] Unit

## 2022-10-27 NOTE — Progress Notes (Signed)
[facility] [place], [place] 10/27/22  Subjective:   Hospital day # 14  Patient was seen and evaluated earlier in the day during hemodialysis treatment. Found to be tolerating well. Appears to be a bit more calm today.  Renal: 09/11 0701 - 09/12 0700 In: -  Out: 750 [Urine:750] Lab Results  Component Value Date   CREATININE 3.12 (H) 10/27/2022   CREATININE 2.77 (H) 10/26/2022   CREATININE 3.50 (H) 10/25/2022     Objective:  Vital signs in last 24 hours:  Temp:  [98.2 F (36.8 C)-98.3 F (36.8 C)] 98.3 F (36.8 C) (09/12 0853) Pulse Rate:  [42-108] 42 (09/12 1130) Resp:  [10-25] 25 (09/12 1200) BP: (77-150)/(48-99) 100/62 (09/12 1200) SpO2:  [92 %-100 %] 94 % (09/12 1200) Weight:  [78.8 kg-79.7 kg] 79.7 kg (09/12 0853)  Weight change: -0.1 kg Filed Weights   10/25/22 1341 10/27/22 0500 10/27/22 0853  Weight: 78.9 kg 78.8 kg 79.7 kg    Intake/Output:    Intake/Output Summary (Last 24 hours) at 10/27/2022 1405 Last data filed at 10/27/2022 0330 Gross per 24 hour  Intake --  Output 550 ml  Net -550 ml     Physical Exam: General: No acute distress  HEENT Moist oral mucous membranes  Pulm/lungs Clear, Bent O2  CVS/Heart Irregular  Abdomen:  Soft, mildly distended, nontender  Extremities: Dependent edema present  Neurologic: I awake, alert  Skin: Warm, LLE bruising  Access: None       Basic Metabolic Panel:  Recent Labs  Lab 10/24/22 0423 10/25/22 0453 10/25/22 1258 10/25/22 1615 10/26/22 0014 10/26/22 0528 10/26/22 1008 10/26/22 1334 10/27/22 0403  NA 136 138 138  --   --  137  --   --  138  K 5.5* 6.1* 6.2*   < > 5.1 5.0 4.7 4.8 4.8  CL 99 105 104  --   --  102  --   --  101  CO2 24 24 22   --   --  25  --   --  24  GLUCOSE 120* 112* 106*  --   --  102*  --   --  119*  BUN 88* 95* 92*  --   --  67*  --   --  79*  CREATININE 3.73* 3.47* 3.50*  --   --  2.77*  --   --  3.12*  CALCIUM 8.3* 8.9 8.7*  --   --  8.2*  --   --   8.5*  PHOS 6.8* 5.8* 6.4*  --   --  5.5*  --   --  6.0*   < > = values in this interval not displayed.     CBC: Recent Labs  Lab 10/22/22 0834 10/23/22 0955 10/24/22 0423 10/25/22 0453 10/27/22 0403  WBC 37.9* 35.3* 29.8* 27.9* 23.0*  NEUTROABS 30.4* 29.1* 23.8* 22.6* 17.8*  HGB 10.7* 10.4* 10.2* 10.4* 9.0*  HCT 30.9* 31.3* 29.8* 32.3* 26.7*  MCV 86.8 89.2 88.2 91.5 89.3  PLT 492* 560* 597* 692* 463*      Lab Results  Component Value Date   HEPBSAG NON REACTIVE 10/15/2022   HEPBIGM NON REACTIVE 10/15/2022      Microbiology:  No results found for this or any previous visit (from the past 240 hour(s)).   Coagulation Studies: No results for input(s): "LABPROT", "INR" in the last 72 hours.   Urinalysis: No results for input(s): "COLORURINE", "LABSPEC", "PHURINE", "GLUCOSEU", "HGBUR", "BILIRUBINUR", "KETONESUR", "PROTEINUR", "UROBILINOGEN", "NITRITE", "LEUKOCYTESUR" in the  last 72 hours.  Invalid input(s): "APPERANCEUR"     Imaging: No results found.   Medications:    sodium chloride     calcium gluconate     sodium chloride       (feeding supplement) PROSource Plus  30 mL Oral BID BM   vitamin C  500 mg Oral BID   Chlorhexidine Gluconate Cloth  6 each Topical Q0600   cyanocobalamin  1,000 mcg Oral Daily   insulin aspart  10 Units Intravenous Once   And   dextrose  1 ampule Intravenous Once   feeding supplement (NEPRO CARB STEADY)  237 mL Oral TID BM   folic acid  1 mg Oral Daily   heparin injection (subcutaneous)  5,000 Units Subcutaneous Q8H   multivitamin  1 tablet Oral QHS   nicotine  14 mg Transdermal Daily   patiromer  16.8 g Oral Daily   sodium zirconium cyclosilicate  10 g Oral Daily   thiamine  100 mg Oral Daily   Or   thiamine  100 mg Intravenous Daily   Vitamin D (Ergocalciferol)  50,000 Units Oral Q7 days   acetaminophen, chlorproMAZINE, heparin, melatonin, morphine injection, ondansetron **OR** ondansetron (ZOFRAN) IV, mouth rinse,  polyethylene glycol, prochlorperazine, traMADol, traZODone  Assessment/ Plan:  72 y.o. male with  tobacco use, alcohol abuse, osteoarthritis status post left knee replacement and hyperlipidemia, frequent falls over the past few weeks since July 4 th admitted on 10/13/2022 for AKI (acute kidney injury) (HCC) [N17.9]   Acute Kidney Injury on chronic kidney disease stage IIIB   Baseline creatinine of 1.74, GFR of 41 on 08/18/22 Suspect patient's acute kidney injury secondary to NSAID induced nephropathy and concurrent sepsis. No history of diabetes or hypertension per patient. Renal ultrasound with no obstruction. No IV contrast. -Underwent first hemodialysis treatment on 10/15/2022, last treatment completed on 10/21/22.   - Dialysis restarted on 10/25/22 due to elevated BUN and decreased urine output -Patient has had some periods of agitation during dialysis -Patient was seen and evaluated during hemodialysis today in the a.m. of 10/27/2022.  Found to be tolerating well.  Appreciate palliative care input as well.  UTI/sepsis Urinary source-urine culture from 10/14/2022 is growing Klebsiella. Completed iv antibiotics  Hyperkalemia Potassium now corrected and currently 4.8.   LOS: 14 Joshua Mora 9/12/20242:05 PM  [facility] [address] [place], [place] [phone]

## 2022-10-27 NOTE — Progress Notes (Signed)
Okay Progress Note   Patient: Joshua Mora XBJ:[id number] DOB: Aug 04, 1950 DOA: 10/13/2022     14 DOS: the patient was seen and examined on 10/27/2022   Brief hospital course: Taken from prior notes.  Joshua Mora is a 72 y.o. male with medical history significant of alcohol abuse, tobacco use disorder, diet controlled prediabetes, CKD 3B, osteoarthritis s/p left knee replacement who presented to the ED with complaints of progressive weakness and frequent falls over the past 1 month.     On presentation he was found to have severely elevated creatinine above baseline for which nephrology was consulted.  Also with significant leukocytosis above 46,000 with concern for leukemoid reaction in the setting of presumptive UTI.  Trauma imaging were nonacute.  CT head also nonacute.  Hospital course was complicated by acute hypoxic respiratory failure.  Was found to have mild pulmonary edema on chest x-ray.  Elevated D-dimer above 11 with concern for possible pulmonary embolism.  Heparin drip initiated.  9/1: Overnight became febrile at 100.7.  Urine cultures with Klebsiella pneumonia which are sensitive to ceftriaxone.  Had bilateral knee injuries which does not appear infected.  Worsening leukocytosis.  VQ scan negative for PE, lower extremity venous Doppler negative for DVT, discontinuing heparin infusion.  Adding vancomycin to broaden up the coverage.  Also concern of Leukomed reaction versus any underlying undiagnosed leukemia-hematology was consulted. Also ordered labs to rule out DIC due to worsening D-dimer.  9/2: Some improvement in leukocytosis, hematology ordered few more labs, if he continued to have persistently elevated white cell count then he might need bone marrow for further evaluation. Also consulted orthopedic surgery to get their opinion on this recent left nondisplaced fibular neck fracture, slight worsening of creatinine but good UOP.  Nephrology would like to wait before  proceeding for another dialysis.  9/3: Renal function seems stable.  Preliminary left knee aspirate with no growth.  Improving leukocytosis.  ID was also consulted.  Can transfer to progressive now.  9/4: Slowly improving creatinine but patient remained elevated.  Nephrology to decide again tomorrow if he need another dialysis.  Improving leukocytosis, elevated kappa and lambda with normal ratio, rest of the labs pending.  ID is now on board and likely switch to p.o. antibiotics soon.  9/5: Some worsening confusion overnight with poor p.o. intake.  Renal function seems stable but GFR remains at 13-going for another dialysis today.  9/6: Vitals and renal function stable, unable to do HD yesterday due to mechanical issues with machine, plan to do hemodialysis today at [facility]  Potassium 5.7-Lokelma ordered.  Worsening leukocytosis, message sent to hematology. Ordered left ankle MRI due to worsening pain and swelling, x-ray was negative. Antibiotics were stopped yesterday.  9/7: Still feeling weak, renal function improved after getting dialysis yesterday, temperature catheter was removed and he will be monitor over the weekend.  Still oliguric.  Worsening leukocytosis.  Likely will get bone marrow biopsy on Monday-hematology placed orders. MRI of left ankle with complete tear of talofibular ligament and multiple other ligamental injuries-podiatry was consulted  9/8: Slight worsening of renal function with mild hyperkalemia.  Leukocytosis seems stable.  Podiatry is recommending conservative management of his ankle injuries with CAM boot for 4 to 6 weeks and outpatient follow-up.  Going for bone marrow biopsy tomorrow.  9/9: Renal functions and hyperkalemia with slight worsening.  Bone marrow biopsy is being rescheduled for tomorrow due to lab scheduling issues.  UOP of 1100, if continue to have worsening renal  function then nephrology will put permanent HD catheter tomorrow.  9/10: Renal function  seems stable but worsening hyperkalemia with potassium at 6.1 today.  Medical management ordered.  Temporary HD catheter was placed with femoral access and patient will be getting dialysis.  Also going for bone marrow biopsy  9/11: Renal function with some improvement after getting partial dialysis yesterday.  Hyperkalemia resolved.  Patient was becoming very agitated and started pulling on dialysis catheter so it was terminated prematurely.  Pending bone marrow biopsy results. Palliative care was also consulted to discuss goals of care.  9/12: Vitals stable, less irritable today.  Will be getting another dialysis, nephrology is trying to have permanent catheter placed.  Pending bone marrow biopsy results.   Assessment and Plan: * Acute renal failure (HCC) Patient with no prior diagnosis but came with significant AKI and oliguria on admission. History of regular NSAID use which can be contributory. Received 2 sessions of dialysis and then the initial temporary HD catheter was removed to monitor.   Another temporary catheter was placed by vascular on 9/10 and patient was restarted on dialysis, likely will need a permanent catheter placement soon. Nephrology is on board and closely monitoring  Severe sepsis [facility]) Patient met sepsis criteria with tachycardia, severe leukocytosis and severe sepsis with lactic acidosis above 4.3.  Initial procalcitonin was 60.67 which is decreasing. Did develop a low-grade fever overnight again.  Procalcitonin improving.  Preliminary blood cultures negative, urine cultures with Klebsiella pneumonia which shows only resistance to ampicillin and Augmentin.  Patient did received ceftriaxone for the past 3 days.  Vancomycin was added on 10/16/2022.  Left knee aspirate with no growth so far. -ID consult -Completed the course of antibiotic and ID is advising monitoring without antibiotic for now. -Continue to monitor  Leukocytosis Slowly improving, pathologist smear  review with immature granulocytes, initial smear review with bandemia.  Urine cultures with Klebsiella pneumonia but patient is being treated with appropriate antibiotics. Significantly elevated D-dimer which continued to get worse.  VTE has been ruled out with negative VQ scan and lower extremity venous Doppler. -Completed the course of antibiotic -Hematology consult-ordered some more labs, likely leukemoid reaction with severe inflammation and infection but due to persistent elevation patient underwent bone marrow biopsy on 9/10-pending results  Elevated d-dimer Patient with worsening D-dimer, now at 13.  COVID was negative. DVT has been ruled out with negative VQ scan and lower extremity venous Doppler. -Stop heparin infusion -Checking labs for DIC -Continue to monitor  Acute hypoxemic respiratory failure (HCC) Patient did develop acute hypoxic respiratory failure and repeat chest x-ray at that time with concern of pulmonary congestion.  Echocardiogram with normal EF and grade 1 diastolic dysfunction.  VQ scan negative for PE Patient with significant AKI, also concern of NSAID induced renal injury. Started on dialysis. -Continue IV Lasix -Continue supplemental oxygen-wean as tolerated  Frequent falls Patient with history of recent decline, poor appetite and multiple falls.  First fall was on July 4 and since then he was falling couple of times a week.  Left knee imaging done on 8/23 with a concern of nondisplaced fibular neck fracture, Initial plan was outpatient follow-up with orthopedic surgery. Requested orthopedic surgery to evaluate while he is in hospital B12 normal, significantly low vitamin D-started on supplement. PT/OT are recommending SNF  Nicotine dependence, uncomplicated -Nicotine patch  Traumatic rhabdomyolysis (HCC) Likely secondary to recurrent falls.  Hyperkalemia Improved, some worsening despite getting Lokelma, so it was switched with Veltassa but potassium  continue to get worse. -Medical management -Will be going for another dialysis today -Continuing Veltassa  -Continue to monitor  Left ankle pain Patient continued to have worsening pain and edema of left ankle prompted an MRI of left ankle as initial imaging was negative for any fracture. MRI with complete tear of talofibular ligament and multiple other ligamental injuries. -Podiatry was consulted-recommending conservative management with pain control and CAM boot for 4 to 6 weeks and outpatient follow-up -Continue with pain management      Subjective: Patient was getting another dialysis today.  No new concern.  Left ankle pain seems improving.  Physical Exam: Vitals:   10/27/22 1030 10/27/22 1100 10/27/22 1130 10/27/22 1200  BP: (!) 106/51 (!) 77/55 (!) 79/48 100/62  Pulse: 100 (!) 103 (!) 42   Resp: 14 14 (!) 22 (!) 25  Temp:      TempSrc:      SpO2: 92% 95% 92% 94%  Weight:      Height:       General.  Frail elderly man, in no acute distress. Pulmonary.  Lungs clear bilaterally, normal respiratory effort. CV.  Regular rate and rhythm, no JVD, rub or murmur. Abdomen.  Soft, nontender, nondistended, BS positive. CNS.  Alert and oriented .  No focal neurologic deficit. Extremities.  No edema, no cyanosis, pulses intact and symmetrical. Psychiatry.  Judgment and insight appears normal.   Data Reviewed: Prior data reviewed  Family Communication: Discussed with daughter in the room  Disposition: Status is: Inpatient Remains inpatient appropriate because: Severity of illness  Planned Discharge Destination: Skilled nursing facility  DVT prophylaxis.  Subcu heparin Time spent: 44 minutes  This record has been created using Conservation officer, historic buildings. Errors have been sought and corrected,but may not always be located. Such creation errors do not reflect on the standard of care.   Author: Arnetha Courser, MD 10/27/2022 1:05 PM  For on call review [email or web address].

## 2022-10-28 ENCOUNTER — Encounter
Admission: EM | Disposition: A | Discharge status: Skilled Nursing Facility | Payer: Self-pay | Source: Home / Self Care | Attending: Internal Medicine

## 2022-10-28 ENCOUNTER — Encounter: Payer: Self-pay | Admitting: Vascular Surgery

## 2022-10-28 DIAGNOSIS — N179 Acute kidney failure, unspecified: Secondary | ICD-10-CM | POA: Diagnosis not present

## 2022-10-28 DIAGNOSIS — N186 End stage renal disease: Secondary | ICD-10-CM | POA: Diagnosis not present

## 2022-10-28 DIAGNOSIS — Z992 Dependence on renal dialysis: Secondary | ICD-10-CM | POA: Diagnosis not present

## 2022-10-28 HISTORY — PX: DIALYSIS/PERMA CATHETER INSERTION: CATH118288

## 2022-10-28 LAB — BASIC METABOLIC PANEL
Anion gap: 12 (ref 5–15)
BUN: 62 mg/dL — ABNORMAL HIGH (ref 8–23)
CO2: 27 mmol/L (ref 22–32)
Calcium: 8.4 mg/dL — ABNORMAL LOW (ref 8.9–10.3)
Chloride: 98 mmol/L (ref 98–111)
Creatinine, Ser: 2.73 mg/dL — ABNORMAL HIGH (ref 0.61–1.24)
GFR, Estimated: 24 mL/min — ABNORMAL LOW (ref >60)
Glucose, Bld: 103 mg/dL — ABNORMAL HIGH (ref 70–99)
Potassium: 5.2 mmol/L — ABNORMAL HIGH (ref 3.5–5.1)
Sodium: 137 mmol/L (ref 135–145)

## 2022-10-28 LAB — CBC WITH DIFFERENTIAL/PLATELET
Abs Immature Granulocytes: 0.21 10*3/uL — ABNORMAL HIGH (ref 0.00–0.07)
Basophils Absolute: 0.1 10*3/uL (ref 0.0–0.1)
Basophils Relative: 0 %
Eosinophils Absolute: 0.2 10*3/uL (ref 0.0–0.5)
Eosinophils Relative: 1 %
HCT: 29.8 % — ABNORMAL LOW (ref 39.0–52.0)
Hemoglobin: 9.8 g/dL — ABNORMAL LOW (ref 13.0–17.0)
Immature Granulocytes: 1 %
Lymphocytes Relative: 10 %
Lymphs Abs: 2.9 10*3/uL (ref 0.7–4.0)
MCH: 30.3 pg (ref 26.0–34.0)
MCHC: 32.9 g/dL (ref 30.0–36.0)
MCV: 92.3 fL (ref 80.0–100.0)
Monocytes Absolute: 2.2 10*3/uL — ABNORMAL HIGH (ref 0.1–1.0)
Monocytes Relative: 8 %
Neutro Abs: 24.2 10*3/uL — ABNORMAL HIGH (ref 1.7–7.7)
Neutrophils Relative %: 80 %
Platelets: 355 10*3/uL (ref 150–400)
RBC: 3.23 MIL/uL — ABNORMAL LOW (ref 4.22–5.81)
RDW: 16.2 % — ABNORMAL HIGH (ref 11.5–15.5)
Smear Review: NORMAL
WBC: 29.8 10*3/uL — ABNORMAL HIGH (ref 4.0–10.5)
nRBC: 0 % (ref 0.0–0.2)

## 2022-10-28 LAB — GLUCOSE, CAPILLARY
Glucose-Capillary: 89 mg/dL (ref 70–99)
Glucose-Capillary: 111 mg/dL — ABNORMAL HIGH (ref 70–99)

## 2022-10-28 SURGERY — DIALYSIS/PERMA CATHETER INSERTION
Anesthesia: Moderate Sedation

## 2022-10-28 MED ORDER — DIPHENHYDRAMINE HCL 50 MG/ML IJ SOLN: 50.0000 mg | Freq: Once | INTRAMUSCULAR | Status: DC | PRN

## 2022-10-28 MED ORDER — ONDANSETRON HCL 4 MG/2ML IJ SOLN: 4.0000 mg | Freq: Four times a day (QID) | INTRAMUSCULAR | Status: DC | PRN

## 2022-10-28 MED ORDER — MIDAZOLAM HCL 5 MG/5ML IJ SOLN
INTRAMUSCULAR | Status: AC
  Filled 2022-10-28: qty 5

## 2022-10-28 MED ORDER — MIDAZOLAM HCL 2 MG/2ML IJ SOLN
INTRAMUSCULAR | Status: DC | PRN
  Administered 2022-10-28: 1 mg via INTRAVENOUS

## 2022-10-28 MED ORDER — MIDAZOLAM HCL 2 MG/ML PO SYRP: 8.0000 mg | ORAL_SOLUTION | Freq: Once | ORAL | Status: DC | PRN

## 2022-10-28 MED ORDER — CEFAZOLIN SODIUM-DEXTROSE 1-4 GM/50ML-% IV SOLN
1.0000 g | INTRAVENOUS | Status: AC
  Administered 2022-10-28: 1 g via INTRAVENOUS
  Filled 2022-10-28: qty 50

## 2022-10-28 MED ORDER — FENTANYL CITRATE (PF) 100 MCG/2ML IJ SOLN
INTRAMUSCULAR | Status: DC | PRN
  Administered 2022-10-28: 25 ug via INTRAVENOUS

## 2022-10-28 MED ORDER — TRAMADOL HCL 50 MG PO TABS
50.0000 mg | ORAL_TABLET | Freq: Four times a day (QID) | ORAL | Status: DC | PRN
  Administered 2022-10-29 – 2022-10-30 (×3): 50 mg via ORAL
  Administered 2022-10-30 – 2022-10-31 (×3): 100 mg via ORAL
  Administered 2022-10-31: 50 mg via ORAL
  Administered 2022-10-31 – 2022-11-01 (×2): 100 mg via ORAL
  Filled 2022-10-28: qty 1
  Filled 2022-10-28 (×4): qty 2
  Filled 2022-10-28: qty 1
  Filled 2022-10-28: qty 2
  Filled 2022-10-28: qty 1
  Filled 2022-10-28: qty 2

## 2022-10-28 MED ORDER — CEFAZOLIN SODIUM-DEXTROSE 1-4 GM/50ML-% IV SOLN
INTRAVENOUS | Status: AC
  Filled 2022-10-28: qty 50

## 2022-10-28 MED ORDER — METHYLPREDNISOLONE SODIUM SUCC 125 MG IJ SOLR: 125.0000 mg | Freq: Once | INTRAMUSCULAR | Status: DC | PRN

## 2022-10-28 MED ORDER — HEPARIN SODIUM (PORCINE) 10000 UNIT/ML IJ SOLN
INTRAMUSCULAR | Status: DC | PRN
  Administered 2022-10-28: 10000 [IU]

## 2022-10-28 MED ORDER — HEPARIN (PORCINE) IN NACL 1000-0.9 UT/500ML-% IV SOLN
INTRAVENOUS | Status: DC | PRN
  Administered 2022-10-28: 500 mL

## 2022-10-28 MED ORDER — FAMOTIDINE 20 MG PO TABS: 40.0000 mg | ORAL_TABLET | Freq: Once | ORAL | Status: DC | PRN

## 2022-10-28 MED ORDER — LIDOCAINE-EPINEPHRINE (PF) 1 %-1:200000 IJ SOLN
INTRAMUSCULAR | Status: DC | PRN
  Administered 2022-10-28: 20 mL

## 2022-10-28 MED ORDER — HYDROMORPHONE HCL 1 MG/ML IJ SOLN: 1.0000 mg | Freq: Once | INTRAMUSCULAR | Status: DC | PRN

## 2022-10-28 MED ORDER — SODIUM CHLORIDE 0.9 % IV SOLN: INTRAVENOUS | Status: DC

## 2022-10-28 MED ORDER — FENTANYL CITRATE PF 50 MCG/ML IJ SOSY: 12.5000 ug | PREFILLED_SYRINGE | Freq: Once | INTRAMUSCULAR | Status: DC | PRN

## 2022-10-28 MED ORDER — FENTANYL CITRATE (PF) 100 MCG/2ML IJ SOLN
INTRAMUSCULAR | Status: AC
  Filled 2022-10-28: qty 2

## 2022-10-28 SURGICAL SUPPLY — 12 items
ADH SKN CLS APL DERMABOND .7 (GAUZE/BANDAGES/DRESSINGS) ×1
BIOPATCH RED 1 DISK 7.0 (GAUZE/BANDAGES/DRESSINGS) IMPLANT
CATH PALINDROME-P 19CM W/VT (CATHETERS) IMPLANT
COVER PROBE ULTRASOUND 5X96 (MISCELLANEOUS) IMPLANT
DERMABOND ADVANCED .7 DNX12 (GAUZE/BANDAGES/DRESSINGS) IMPLANT
DRAPE INCISE IOBAN 66X45 STRL (DRAPES) IMPLANT
NDL ENTRY 21GA 7CM ECHOTIP (NEEDLE) IMPLANT
NEEDLE ENTRY 21GA 7CM ECHOTIP (NEEDLE) ×1 IMPLANT
PACK ANGIOGRAPHY (CUSTOM PROCEDURE TRAY) IMPLANT
SET INTRO CAPELLA COAXIAL (SET/KITS/TRAYS/PACK) IMPLANT
SUT MNCRL AB 4-0 PS2 18 (SUTURE) IMPLANT
SUT SILK 0 FSL (SUTURE) IMPLANT

## 2022-10-28 NOTE — Interval H&P Note (Signed)
History and Physical Interval Note:  10/28/2022 11:53 AM  Joshua Mora  has presented today for surgery, with the diagnosis of ESRD.  The various methods of treatment have been discussed with the patient and family. After consideration of risks, benefits and other options for treatment, the patient has consented to  Procedure(s): DIALYSIS/PERMA CATHETER INSERTION (N/A) as a surgical intervention.  The patient's history has been reviewed, patient examined, no change in status, stable for surgery.  I have reviewed the patient's chart and labs.  Questions were answered to the patient's satisfaction.     Levora Dredge

## 2022-10-28 NOTE — Progress Notes (Signed)
PROGRESS NOTE    Joshua Mora  MWN:[id number] DOB: 07-Sep-1950 DOA: 10/13/2022 PCP: Sandrea Hughs, NP  139A/139A-AA  LOS: 15 days   Brief hospital course:   Assessment & Plan: Joshua Mora is a 72 y.o. male with medical history significant of alcohol abuse, tobacco use disorder, diet controlled prediabetes, CKD 3B, osteoarthritis s/p left knee replacement who presented to the ED with complaints of progressive weakness and frequent falls over the past 1 month.     On presentation he was found to have severely elevated creatinine above baseline for which nephrology was consulted.  Also with significant leukocytosis above 46,000 with concern for leukemoid reaction in the setting of presumptive UTI.  Trauma imaging were nonacute.  CT head also nonacute.  Hospital course was complicated by acute hypoxic respiratory failure.  Was found to have mild pulmonary edema on chest x-ray.  Elevated D-dimer above 11 with concern for possible pulmonary embolism.  Heparin drip initiated, d/c'ed when V/Q scan and DVT neg.  * Acute renal failure (HCC) Now on HD Patient with no prior diagnosis but came with significant AKI and oliguria on admission. History of regular NSAID use which can be contributory. Plan: --PermCath placement today --iHD per nephro   Severe sepsis [facility]) Patient met sepsis criteria with tachycardia, severe leukocytosis and severe sepsis with lactic acidosis above 4.3.  Initial procalcitonin was 60.67 which is decreasing. Did develop a low-grade fever overnight again.  Procalcitonin improving.  Preliminary blood cultures negative, urine cultures with Klebsiella pneumonia which shows only resistance to ampicillin and Augmentin.  Patient did received ceftriaxone for the past 3 days.  Vancomycin was added on 10/16/2022.  Left knee aspirate with no growth so far. -ID consult -Completed the course of antibiotic and ID is advising monitoring without antibiotic for now.    Leukocytosis Slowly improving, pathologist smear review with immature granulocytes, initial smear review with bandemia.  Urine cultures with Klebsiella pneumonia but patient is being treated with appropriate antibiotics. Significantly elevated D-dimer which continued to get worse.  VTE has been ruled out with negative VQ scan and lower extremity venous Doppler. -Completed the course of antibiotic -Hematology consult-ordered some more labs, likely leukemoid reaction with severe inflammation and infection but due to persistent elevation patient underwent bone marrow biopsy on 9/10-pending results   Elevated d-dimer Patient with worsening D-dimer, now at 13.  COVID was negative. DVT has been ruled out with negative VQ scan and lower extremity venous Doppler.   Acute hypoxemic respiratory failure (HCC) Patient did develop acute hypoxic respiratory failure and repeat chest x-ray at that time with concern of pulmonary congestion.  Echocardiogram with normal EF and grade 1 diastolic dysfunction.  VQ scan negative for PE Patient with significant AKI, also concern of NSAID induced renal injury. Started on dialysis. --Continue supplemental O2 to keep sats >=90%, wean as tolerated  Frequent falls Patient with history of recent decline, poor appetite and multiple falls.  First fall was on July 4 and since then he was falling couple of times a week.  Left knee imaging done on 8/23 with a concern of nondisplaced fibular neck fracture, Initial plan was outpatient follow-up with orthopedic surgery. Requested orthopedic surgery to evaluate while he is in hospital B12 normal, significantly low vitamin D-started on supplement. PT/OT are recommending SNF   Nicotine dependence, uncomplicated -Nicotine patch   Traumatic rhabdomyolysis (HCC) Likely secondary to recurrent falls.   Hyperkalemia Improved, some worsening despite getting Lokelma, so it was switched with Veltassa but potassium  continue to get  worse. -Continuing Veltassa  --iHD per nephro   Left ankle pain Patient continued to have worsening pain and edema of left ankle prompted an MRI of left ankle as initial imaging was negative for any fracture. MRI with complete tear of talofibular ligament and multiple other ligamental injuries. -Podiatry was consulted-recommending conservative management with pain control and CAM boot for 4 to 6 weeks and outpatient follow-up -Continue with pain management    DVT prophylaxis: Heparin SQ Code Status: Full code  Family Communication:  Level of care: Telemetry Medical Dispo:   The patient is from: home Anticipated d/c is to: SNF rehab Anticipated d/c date is: whenever dialysis is set up as outpatient   Subjective and Interval History:  Pt underwent permcath placemen today.     Objective: Vitals:   10/28/22 1315 10/28/22 1330 10/28/22 1345 10/28/22 1510  BP: (!) 134/52 (!) 115/49 136/62 132/70  Pulse: 89 88 90 94  Resp: 14 18 19 17   Temp:    97.9 F (36.6 C)  TempSrc:      SpO2: (!) 88% 95% 97% 100%  Weight:      Height:        Intake/Output Summary (Last 24 hours) at 10/28/2022 1916 Last data filed at 10/28/2022 0615 Gross per 24 hour  Intake --  Output 500 ml  Net -500 ml   Filed Weights   10/27/22 0500 10/27/22 0853 10/28/22 0500  Weight: 78.8 kg 79.7 kg 80.1 kg    Examination:   Constitutional: NAD, sleepy but arousable HEENT: conjunctivae and lids normal, EOMI CV: No cyanosis.   RESP: normal respiratory effort, on 2L Extremities: No effusions, edema in BLE SKIN: warm, dry   Data Reviewed: I have personally reviewed labs and imaging studies  Time spent: 35 minutes  Darlin Priestly, MD [facility] If 7PM-7AM, please contact night-coverage 10/28/2022, 7:16 PM

## 2022-10-28 NOTE — Progress Notes (Signed)
R temp femoral HD catheter removed per order and with no complications.  Pressure held to achieve hemostasis.  Vaseline/gauze/tegaderm applied.  Aftercare instructions reviewed with pt and family who verbalized understanding.

## 2022-10-28 NOTE — Progress Notes (Signed)
Palliative Care Progress Note   Patient Name: Joshua Mora       Date: 10/28/2022 DOB: 04-14-1950  Age: 72 y.o. MRN#: [id number] Attending Physician: Darlin Priestly, MD Primary Care Physician: Sandrea Hughs, NP Admit Date: 10/13/2022  Chart reviewed.  I attempted to assess the patient this morning.  Patient was receiving patient care/getting cleaned up.  I rounded on patient in the afternoon and he was in procedure to have HD catheter placed.  PMT will continue to follow and support patient without his hospitalization.  Ongoing goals of care discussions to continue.  PMT will follow-up at a later date/time.  Thank you for allowing the Palliative Medicine Team to assist in the care of [name].  Samara Deist L. Bonita Quin, DNP, FNP-BC Palliative Medicine Team    No charge

## 2022-10-28 NOTE — Progress Notes (Signed)
OT Cancellation Note  Patient Details Name: Antrell Abraha MRN: [id number] DOB: 1950-05-15   Cancelled Treatment:    Reason Eval/Treat Not Completed: Patient at procedure or test/ unavailable.Pt currently off unit in surgery to have right IJ tunneled dialysis catheter placed. OT will re-attempt as time allows.   Jackquline Denmark, MS, OTR/L , CBIS ascom [phone]  10/28/22, 1:07 PM

## 2022-10-28 NOTE — Progress Notes (Signed)
[facility] [place], [place] 10/28/22  Subjective:   Hospital day # 15  Patient underwent right IJ PermCath placement today. Was a bit agitated prior to the procedure. Due for dialysis treatment again tomorrow. Serum potassium up slightly to 5.2.  Renal: 09/12 0701 - 09/13 0700 In: -  Out: 500 [Urine:500] Lab Results  Component Value Date   CREATININE 2.73 (H) 10/28/2022   CREATININE 3.12 (H) 10/27/2022   CREATININE 2.77 (H) 10/26/2022     Objective:  Vital signs in last 24 hours:  Temp:  [98.2 F (36.8 C)-98.7 F (37.1 C)] 98.4 F (36.9 C) (09/13 1151) Pulse Rate:  [76-115] 90 (09/13 1345) Resp:  [12-27] 19 (09/13 1345) BP: (98-142)/(47-76) 136/62 (09/13 1345) SpO2:  [88 %-100 %] 97 % (09/13 1345) Weight:  [80.1 kg] 80.1 kg (09/13 0500)  Weight change: 0.9 kg Filed Weights   10/27/22 0500 10/27/22 0853 10/28/22 0500  Weight: 78.8 kg 79.7 kg 80.1 kg    Intake/Output:    Intake/Output Summary (Last 24 hours) at 10/28/2022 1409 Last data filed at 10/28/2022 0615 Gross per 24 hour  Intake --  Output 500 ml  Net -500 ml     Physical Exam: General: No acute distress  HEENT Moist oral mucous membranes  Pulm/lungs Clear, [place] O2  CVS/Heart Irregular  Abdomen:  Soft, mildly distended, nontender  Extremities: Dependent edema present  Neurologic: Awake, alert  Skin: Warm, LLE bruising  Access: None       Basic Metabolic Panel:  Recent Labs  Lab 10/24/22 0423 10/25/22 0453 10/25/22 1258 10/25/22 1615 10/26/22 0528 10/26/22 1008 10/26/22 1334 10/27/22 0403 10/28/22 0854  NA 136 138 138  --  137  --   --  138 137  K 5.5* 6.1* 6.2*   < > 5.0 4.7 4.8 4.8 5.2*  CL 99 105 104  --  102  --   --  101 98  CO2 24 24 22   --  25  --   --  24 27  GLUCOSE 120* 112* 106*  --  102*  --   --  119* 103*  BUN 88* 95* 92*  --  67*  --   --  79* 62*  CREATININE 3.73* 3.47* 3.50*  --  2.77*  --   --  3.12* 2.73*  CALCIUM 8.3* 8.9 8.7*  --  8.2*   --   --  8.5* 8.4*  PHOS 6.8* 5.8* 6.4*  --  5.5*  --   --  6.0*  --    < > = values in this interval not displayed.     CBC: Recent Labs  Lab 10/23/22 0955 10/24/22 0423 10/25/22 0453 10/27/22 0403 10/28/22 0402  WBC 35.3* 29.8* 27.9* 23.0* 29.8*  NEUTROABS 29.1* 23.8* 22.6* 17.8* 24.2*  HGB 10.4* 10.2* 10.4* 9.0* 9.8*  HCT 31.3* 29.8* 32.3* 26.7* 29.8*  MCV 89.2 88.2 91.5 89.3 92.3  PLT 560* 597* 692* 463* 355      Lab Results  Component Value Date   HEPBSAG NON REACTIVE 10/15/2022   HEPBIGM NON REACTIVE 10/15/2022      Microbiology:  No results found for this or any previous visit (from the past 240 hour(s)).   Coagulation Studies: No results for input(s): "LABPROT", "INR" in the last 72 hours.   Urinalysis: No results for input(s): "COLORURINE", "LABSPEC", "PHURINE", "GLUCOSEU", "HGBUR", "BILIRUBINUR", "KETONESUR", "PROTEINUR", "UROBILINOGEN", "NITRITE", "LEUKOCYTESUR" in the last 72 hours.  Invalid input(s): "APPERANCEUR"     Imaging: PERIPHERAL VASCULAR  CATHETERIZATION  Result Date: 10/28/2022 See surgical note for result.    Medications:    sodium chloride     [MAR Hold] calcium gluconate     [MAR Hold] sodium chloride       [MAR Hold] (feeding supplement) PROSource Plus  30 mL Oral BID BM   [MAR Hold] vitamin C  500 mg Oral BID   [MAR Hold] Chlorhexidine Gluconate Cloth  6 each Topical Q0600   [MAR Hold] cyanocobalamin  1,000 mcg Oral Daily   [MAR Hold] insulin aspart  10 Units Intravenous Once   And   [MAR Hold] dextrose  1 ampule Intravenous Once   [MAR Hold] feeding supplement (NEPRO CARB STEADY)  237 mL Oral TID BM   [MAR Hold] folic acid  1 mg Oral Daily   [MAR Hold] heparin injection (subcutaneous)  5,000 Units Subcutaneous Q8H   [MAR Hold] multivitamin  1 tablet Oral QHS   [MAR Hold] nicotine  14 mg Transdermal Daily   [MAR Hold] patiromer  16.8 g Oral Daily   [MAR Hold] sodium zirconium cyclosilicate  10 g Oral Daily   [MAR  Hold] thiamine  100 mg Oral Daily   Or   [MAR Hold] thiamine  100 mg Intravenous Daily   [MAR Hold] Vitamin D (Ergocalciferol)  50,000 Units Oral Q7 days   [MAR Hold] acetaminophen, [MAR Hold] chlorproMAZINE, [MAR Hold] heparin, [MAR Hold] melatonin, [MAR Hold]  morphine injection, [MAR Hold] ondansetron **OR** [MAR Hold] ondansetron (ZOFRAN) IV, [MAR Hold] mouth rinse, [MAR Hold] polyethylene glycol, [MAR Hold] prochlorperazine, [MAR Hold] traMADol, [MAR Hold] traZODone  Assessment/ Plan:  72 y.o. male with  tobacco use, alcohol abuse, osteoarthritis status post left knee replacement and hyperlipidemia, frequent falls over the past few weeks since July 4 th admitted on 10/13/2022 for AKI (acute kidney injury) (HCC) [N17.9]   Acute Kidney Injury on chronic kidney disease stage IIIB   Baseline creatinine of 1.74, GFR of 41 on 08/18/22 Suspect patient's acute kidney injury secondary to NSAID induced nephropathy and concurrent sepsis. No history of diabetes or hypertension per patient. Renal ultrasound with no obstruction. No IV contrast. -Underwent first hemodialysis treatment on 10/15/2022, first series lasted until 10/21/22.   - Dialysis restarted on 10/25/22 due to elevated BUN and decreased urine output -Patient has had some periods of agitation during dialysis -Patient underwent hemodialysis treatment yesterday.  We will plan for dialysis treatment again tomorrow.  UTI/sepsis Urinary source-urine culture from 10/14/2022 is growing Klebsiella. Completed iv antibiotics  Hyperkalemia Serum potassium up to 5.2.  This should be treated with dialysis tomorrow.   LOS: 15 Joshua Mora 9/13/20242:09 PM  [facility] [address] [place], [place] [phone]

## 2022-10-28 NOTE — Plan of Care (Signed)

## 2022-10-28 NOTE — Progress Notes (Signed)
PT Cancellation Note  Patient Details Name: Obama Garno MRN: [id number] DOB: [birth year]   Cancelled Treatment:    Reason Eval/Treat Not Completed: Patient at procedure or test/unavailable, will attempt to see pt at a future date/time as medically appropriate.    Ovidio Hanger PT, DPT 10/28/22, 1:56 PM

## 2022-10-28 NOTE — Op Note (Signed)
[facility]   OPERATIVE NOTE     PROCEDURE: 1. Insertion of a right IJ tunneled dialysis catheter. 2. Catheter placement and cannulation under ultrasound and fluoroscopic guidance  PRE-OPERATIVE DIAGNOSIS: end-stage renal requiring hemodialysis  POST-OPERATIVE DIAGNOSIS: same as above  SURGEON: Levora Dredge  ANESTHESIA: Conscious sedation was administered under my direct supervision by the interventional radiology RN. IV Versed plus fentanyl were utilized. Continuous ECG, pulse oximetry and blood pressure was monitored throughout the entire procedure.  Conscious sedation was for a total of 25 minutes.  ESTIMATED BLOOD LOSS: Minimal  FINDING(S): 1.  Tips of the catheter in the right atrium on fluoroscopy 2.  No obvious pneumothorax on fluoroscopy  SPECIMEN(S):  none  INDICATIONS:   Joshua Mora is a 72 y.o. male  presents with end stage renal disease.  Therefore, the patient requires a tunneled dialysis catheter placement.  The patient is informed of  the risks catheter placement include but are not limited to: bleeding, infection, central venous injury, pneumothorax, possible venous stenosis, possible malpositioning in the venous system, and possible infections related to long-term catheter presence.  The patient was aware of these risks and agreed to proceed.  DESCRIPTION: The patient was taken back to Special Procedure suite.  Prior to sedation, the patient was given IV antibiotics.  After obtaining adequate sedation, the patient was prepped and draped in the standard fashion for a right IJ tunneled dialysis catheter placement.  Appropriate Time Out is called.     The right neck and chest wall are then infiltrated with 1% Lidocaine with epinepherine.  A 19 cm tip to cuff catheter is then selected, opened on the back table and prepped. Ultrasound is placed in a sterile sleeve.  Under ultrasound guidance, the right internal jugular vein is examined  and is noted to be echolucent and easily compressible indicating patency.   An image is recorded for the permanent record.  The right internal jugular vein is cannulated with the microneedle under direct ultrasound vissualization.  A Microwire followed by a micro sheath is then inserted without difficulty.   A J-wire was then advanced under fluoroscopic guidance into the inferior vena cava and the wire was secured.  Small counter incision was then made at the wire insertion site. A small pocket was fashioned with blunt dissection to allow easier passage of the cuff.  The dilator and peel-away sheath are then advanced over the wire under fluoroscopic guidance. The catheters and advanced through the peel-away sheath. It is approximated to the right chest wall after verifying the tips at the atrial caval junction and an exit site is selected.  Small incision is made at the selected exit site and the tunneling device was passed subcutaneously to the counter incision. Catheter is then pulled through the subcutaneous tunnel. The catheter is then verified for tip position under fluoroscopy, transected and the hub assembly connected.    Each port was tested by aspirating and flushing.  No resistance was noted.  Each port was then thoroughly flushed with heparinized saline.  The catheter was secured in placed with two interrupted stitches of 0 silk tied to the catheter.  The counter incision was closed with a U-stitch of 4-0 Monocryl.  The insertion site is then cleaned and sterile bandages applied including a Biopatch.  Each port was then packed with concentrated heparin (1000 Units/mL) at the manufacturer recommended volumes to each port.  Sterile caps were applied to each port.  On completion fluoroscopy, the tips  of the catheter were in the right atrium, and there was no evidence of pneumothorax.  COMPLICATIONS: None  CONDITION: Unchanged   Levora Dredge [facility] Office:  [phone]   10/28/2022, 12:47 PM

## 2022-10-28 NOTE — Plan of Care (Signed)
Problem: Clinical Measurements: Goal: Ability to maintain clinical measurements within normal limits will improve Outcome: Progressing Goal: Respiratory complications will improve Outcome: Progressing Goal: Cardiovascular complication will be avoided Outcome: Progressing   Problem: Nutrition: Goal: Adequate nutrition will be maintained Outcome: Progressing   Problem: Elimination: Goal: Will not experience complications related to bowel motility Outcome: Progressing Goal: Will not experience complications related to urinary retention Outcome: Progressing   Problem: Pain Managment: Goal: General experience of comfort will improve Outcome: Progressing

## 2022-10-28 NOTE — Care Management Important Message (Signed)
Important Message  Patient Details  Name: Joshua Mora MRN: [id number] Date of Birth: Jun 23, 1950   Medicare Important Message Given:  Yes  Late entry: Reviewed Important Message from Medicare with patient on Thursday afternoon about 3:20 pm but I forgot to document it.    Olegario Messier A Donja Tipping 10/28/2022, 9:02 AM

## 2022-10-29 DIAGNOSIS — N179 Acute kidney failure, unspecified: Secondary | ICD-10-CM | POA: Diagnosis not present

## 2022-10-29 LAB — CBC
HCT: 28.9 % — ABNORMAL LOW (ref 39.0–52.0)
Hemoglobin: 9.5 g/dL — ABNORMAL LOW (ref 13.0–17.0)
MCH: 29.8 pg (ref 26.0–34.0)
MCHC: 32.9 g/dL (ref 30.0–36.0)
MCV: 90.6 fL (ref 80.0–100.0)
Platelets: 351 10*3/uL (ref 150–400)
RBC: 3.19 MIL/uL — ABNORMAL LOW (ref 4.22–5.81)
RDW: 16 % — ABNORMAL HIGH (ref 11.5–15.5)
WBC: 25.1 10*3/uL — ABNORMAL HIGH (ref 4.0–10.5)
nRBC: 0 % (ref 0.0–0.2)

## 2022-10-29 LAB — BASIC METABOLIC PANEL
Anion gap: 10 (ref 5–15)
BUN: 65 mg/dL — ABNORMAL HIGH (ref 8–23)
CO2: 25 mmol/L (ref 22–32)
Calcium: 8.6 mg/dL — ABNORMAL LOW (ref 8.9–10.3)
Chloride: 102 mmol/L (ref 98–111)
Creatinine, Ser: 2.75 mg/dL — ABNORMAL HIGH (ref 0.61–1.24)
GFR, Estimated: 23 mL/min — ABNORMAL LOW (ref >60)
Glucose, Bld: 120 mg/dL — ABNORMAL HIGH (ref 70–99)
Potassium: 4.2 mmol/L (ref 3.5–5.1)
Sodium: 137 mmol/L (ref 135–145)

## 2022-10-29 LAB — MAGNESIUM: Magnesium: 2.2 mg/dL (ref 1.7–2.4)

## 2022-10-29 NOTE — Progress Notes (Signed)
[facility]  ROUNDING NOTE   Subjective:   Daughter at bedside.  Patient with no complaints.  UOP Creatinine 2.75 (2.73)  Last hemodialysis treatment on 9/12.   RIJ permcath placed yesterday by Dr. Gilda Crease   Objective:  Vital signs in last 24 hours:  Temp:  [97.8 F (36.6 C)-98.5 F (36.9 C)] 97.8 F (36.6 C) (09/14 0749) Pulse Rate:  [76-98] 94 (09/14 0749) Resp:  [12-19] 16 (09/14 0749) BP: (113-142)/(47-70) 138/68 (09/14 0749) SpO2:  [88 %-100 %] 98 % (09/14 0749) Weight:  [81.3 kg] 81.3 kg (09/14 0500)  Weight change: 1.6 kg Filed Weights   10/27/22 0853 10/28/22 0500 10/29/22 0500  Weight: 79.7 kg 80.1 kg 81.3 kg    Intake/Output: I/O last 3 completed shifts: In: 300 [P.O.:300] Out: 1200 [Urine:1200]   Intake/Output this shift:  Total I/O In: -  Out: 100 [Urine:100]  Physical Exam: General: NAD, laying in bed  Head: Normocephalic, atraumatic. Moist oral mucosal membranes  Eyes: Anicteric, PERRL  Neck: Supple, trachea midline  Lungs:  Clear to auscultation  Heart: Regular rate and rhythm  Abdomen:  Soft, nontender,   Extremities:  + peripheral edema.  Neurologic: Nonfocal, moving all four extremities  Skin: No lesions  Access: RIJ permcath    Basic Metabolic Panel: Recent Labs  Lab 10/24/22 0423 10/25/22 0453 10/25/22 1258 10/25/22 1615 10/26/22 0528 10/26/22 1008 10/26/22 1334 10/27/22 0403 10/28/22 0854 10/29/22 0426  NA 136 138 138  --  137  --   --  138 137 137  K 5.5* 6.1* 6.2*   < > 5.0 4.7 4.8 4.8 5.2* 4.2  CL 99 105 104  --  102  --   --  101 98 102  CO2 24 24 22   --  25  --   --  24 27 25   GLUCOSE 120* 112* 106*  --  102*  --   --  119* 103* 120*  BUN 88* 95* 92*  --  67*  --   --  79* 62* 65*  CREATININE 3.73* 3.47* 3.50*  --  2.77*  --   --  3.12* 2.73* 2.75*  CALCIUM 8.3* 8.9 8.7*  --  8.2*  --   --  8.5* 8.4* 8.6*  MG  --   --   --   --   --   --   --   --   --  2.2  PHOS 6.8* 5.8* 6.4*  --  5.5*  --    --  6.0*  --   --    < > = values in this interval not displayed.    Liver Function Tests: Recent Labs  Lab 10/24/22 0423 10/25/22 0453 10/25/22 1258 10/26/22 0528 10/27/22 0403  ALBUMIN 2.3* 2.4* 2.3* 2.2* 2.2*   No results for input(s): "LIPASE", "AMYLASE" in the last 168 hours. No results for input(s): "AMMONIA" in the last 168 hours.  CBC: Recent Labs  Lab 10/23/22 0955 10/24/22 0423 10/25/22 0453 10/27/22 0403 10/28/22 0402 10/29/22 0426  WBC 35.3* 29.8* 27.9* 23.0* 29.8* 25.1*  NEUTROABS 29.1* 23.8* 22.6* 17.8* 24.2*  --   HGB 10.4* 10.2* 10.4* 9.0* 9.8* 9.5*  HCT 31.3* 29.8* 32.3* 26.7* 29.8* 28.9*  MCV 89.2 88.2 91.5 89.3 92.3 90.6  PLT 560* 597* 692* 463* 355 351    Cardiac Enzymes: No results for input(s): "CKTOTAL", "CKMB", "CKMBINDEX", "TROPONINI" in the last 168 hours.  BNP: Invalid input(s): "POCBNP"  CBG: Recent Labs  Lab 10/23/22  3086 10/24/22 1010 10/25/22 0816 10/28/22 0810 10/28/22 1648  GLUCAP 110* 120* 92 89 111*    Microbiology: Results for orders placed or performed during the hospital encounter of 10/13/22  SARS Coronavirus 2 by RT PCR (hospital order, performed in [facility] hospital lab) *cepheid single result test* Anterior Nasal Swab     Status: None   Collection Time: 10/13/22  3:55 PM   Specimen: Anterior Nasal Swab  Result Value Ref Range Status   SARS Coronavirus 2 by RT PCR NEGATIVE NEGATIVE Final    Comment: (NOTE) SARS-CoV-2 target nucleic acids are NOT DETECTED.  The SARS-CoV-2 RNA is generally detectable in upper and lower respiratory specimens during the acute phase of infection. The lowest concentration of SARS-CoV-2 viral copies this assay can detect is 250 copies / mL. A negative result does not preclude SARS-CoV-2 infection and should not be used as the sole basis for treatment or other patient management decisions.  A negative result may occur with improper specimen collection / handling, submission of  specimen other than nasopharyngeal swab, presence of viral mutation(s) within the areas targeted by this assay, and inadequate number of viral copies (<250 copies / mL). A negative result must be combined with clinical observations, patient history, and epidemiological information.  Fact Sheet for Patients:   RoadLapTop.co.za  Fact Sheet for Healthcare Providers: http://kim-miller.com/  This test is not yet approved or  cleared by the [place] FDA and has been authorized for detection and/or diagnosis of SARS-CoV-2 by FDA under an Emergency Use Authorization (EUA).  This EUA will remain in effect (meaning this test can be used) for the duration of the COVID-19 declaration under Section 564(b)(1) of the Act, 21 U.S.C. section 360bbb-3(b)(1), unless the authorization is terminated or revoked sooner.  Performed at [facility], [address] [place], [place] [address]   MRSA Next Gen by PCR, Nasal     Status: None   Collection Time: 10/13/22  9:51 PM   Specimen: Nasal Mucosa; Nasal Swab  Result Value Ref Range Status   MRSA by PCR Next Gen NOT DETECTED NOT DETECTED Final    Comment: (NOTE) The GeneXpert MRSA Assay (FDA approved for NASAL specimens only), is one component of a comprehensive MRSA colonization surveillance program. It is not intended to diagnose MRSA infection nor to guide or monitor treatment for MRSA infections. Test performance is not FDA approved in patients less than 79 years old. Performed at [facility], [address] [place], [place] [address]   Urine Culture     Status: Abnormal   Collection Time: 10/14/22  7:32 AM   Specimen: Urine, Random  Result Value Ref Range Status   Specimen Description   Final    URINE, RANDOM Performed at [facility], [address] [place], [place] [address]    Special Requests   Final    NONE Reflexed from [id number] Performed at [facility], [address] [place], [place] [address]    Culture >=100,000 COLONIES/mL KLEBSIELLA PNEUMONIAE (A)  Final   Report Status 10/16/2022 FINAL  Final   Organism ID, Bacteria KLEBSIELLA PNEUMONIAE (A)  Final      Susceptibility   Klebsiella pneumoniae - MIC*    AMPICILLIN >=32 RESISTANT Resistant     CEFAZOLIN <=4 SENSITIVE Sensitive     CEFEPIME <=0.12 SENSITIVE Sensitive     CEFTRIAXONE <=0.25 SENSITIVE Sensitive     CIPROFLOXACIN <=0.25 SENSITIVE Sensitive     GENTAMICIN <=1 SENSITIVE Sensitive  IMIPENEM <=0.25 SENSITIVE Sensitive     NITROFURANTOIN 128 RESISTANT Resistant     TRIMETH/SULFA <=20 SENSITIVE Sensitive     AMPICILLIN/SULBACTAM 8 SENSITIVE Sensitive     PIP/TAZO <=4 SENSITIVE Sensitive     * >=100,000 COLONIES/mL KLEBSIELLA PNEUMONIAE  Culture, blood (Routine X 2) w Reflex to ID Panel     Status: None   Collection Time: 10/15/22  1:29 AM   Specimen: Right Antecubital; Blood  Result Value Ref Range Status   Specimen Description RIGHT ANTECUBITAL  Final   Special Requests   Final    BOTTLES DRAWN AEROBIC AND ANAEROBIC Blood Culture adequate volume   Culture   Final    NO GROWTH 5 DAYS Performed at [facility], [address] [place], [place] [address]    Report Status 10/20/2022 FINAL  Final  Culture, blood (Routine X 2) w Reflex to ID Panel     Status: None   Collection Time: 10/15/22 11:41 AM   Specimen: BLOOD  Result Value Ref Range Status   Specimen Description BLOOD BLOOD RIGHT HAND  Final   Special Requests   Final    BOTTLES DRAWN AEROBIC ONLY Blood Culture results may not be optimal due to an inadequate volume of blood received in culture bottles   Culture   Final    NO GROWTH 5 DAYS Performed at [facility], [address] [place], [place] [address]    Report Status 10/20/2022 FINAL  Final  Body fluid culture w Gram Stain     Status: None   Collection Time: 10/17/22  2:02 PM   Specimen: Synovium; Body Fluid   Result Value Ref Range Status   Specimen Description   Final    SYNOVIAL Performed at [facility], [address] [place], [place] [address]    Special Requests   Final    Normal Performed at [facility], [address] [place], [place] [address]    Gram Stain   Final    FEW WBC PRESENT,BOTH PMN AND MONONUCLEAR NO ORGANISMS SEEN    Culture   Final    NO GROWTH 3 DAYS Performed at [facility], [address], [place], [place] [address]    Report Status 10/21/2022 FINAL  Final    Coagulation Studies: No results for input(s): "LABPROT", "INR" in the last 72 hours.  Urinalysis: No results for input(s): "COLORURINE", "LABSPEC", "PHURINE", "GLUCOSEU", "HGBUR", "BILIRUBINUR", "KETONESUR", "PROTEINUR", "UROBILINOGEN", "NITRITE", "LEUKOCYTESUR" in the last 72 hours.  Invalid input(s): "APPERANCEUR"    Imaging: PERIPHERAL VASCULAR CATHETERIZATION  Result Date: 10/28/2022 See surgical note for result.    Medications:    sodium chloride     calcium gluconate     sodium chloride      (feeding supplement) PROSource Plus  30 mL Oral BID BM   vitamin C  500 mg Oral BID   Chlorhexidine Gluconate Cloth  6 each Topical Q0600   cyanocobalamin  1,000 mcg Oral Daily   insulin aspart  10 Units Intravenous Once   And   dextrose  1 ampule Intravenous Once   feeding supplement (NEPRO CARB STEADY)  237 mL Oral TID BM   folic acid  1 mg Oral Daily   heparin injection (subcutaneous)  5,000 Units Subcutaneous Q8H   multivitamin  1 tablet Oral QHS   nicotine  14 mg Transdermal Daily   thiamine  100 mg Oral Daily   Or   thiamine  100 mg Intravenous Daily  Vitamin D (Ergocalciferol)  50,000 Units Oral Q7 days   acetaminophen, chlorproMAZINE, heparin, melatonin, ondansetron **OR** ondansetron (ZOFRAN) IV, mouth rinse, polyethylene glycol, prochlorperazine, traMADol, traZODone  Assessment/ Plan:  Mr. Majd Blumer is a 72 y.o.  male with tobacco use,  alcohol abuse, osteoarthritis status post left knee replacement and hyperlipidemia, who is admitted on 10/13/2022 for AKI (acute kidney injury) (HCC) [N17.9]  Acute Kidney Injury on chronic kidney disease stage IIIB: Baseline creatinine of 1.74, GFR of 41 on 08/18/22. Suspect patient's acute kidney injury secondary to NSAID induced nephropathy and concurrent sepsis. No history of diabetes or hypertension per patient. Renal ultrasound with no obstruction. No IV contrast. -Underwent first hemodialysis treatment on 10/15/2022, first series lasted until 10/21/22.  Then resumed hemodialysis on 9/10 and 9/12.  - Hold dialysis for today and monitor for renal recovery.   2. Urinary tract infection with sepsis: Klebsiella species.  - completed antibiotics course  3.  Anemia with renal failure: hemoglobin 9.5. Has received EPO.   4. 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     LOS: 16 Joshua Mora 9/14/202411:28 AM

## 2022-10-29 NOTE — Progress Notes (Signed)
PROGRESS NOTE    Joshua Mora  NWG:[id number] DOB: 1950/09/16 DOA: 10/13/2022 PCP: Sandrea Hughs, NP  139A/139A-AA  LOS: 16 days   Brief hospital course:   Assessment & Plan: Joshua Mora is a 72 y.o. male with medical history significant of alcohol abuse, tobacco use disorder, diet controlled prediabetes, CKD 3B, osteoarthritis s/p left knee replacement who presented to the ED with complaints of progressive weakness and frequent falls over the past 1 month.     On presentation he was found to have severely elevated creatinine above baseline for which nephrology was consulted.  Also with significant leukocytosis above 46,000 with concern for leukemoid reaction in the setting of presumptive UTI.  Trauma imaging were nonacute.  CT head also nonacute.  Hospital course was complicated by acute hypoxic respiratory failure.  Was found to have mild pulmonary edema on chest x-ray.  Elevated D-dimer above 11 with concern for possible pulmonary embolism.  Heparin drip initiated, d/c'ed when V/Q scan and DVT neg.  * Acute renal failure (HCC) Now on HD Patient with no prior diagnosis but came with significant AKI and oliguria on admission. History of regular NSAID use which can be contributory. --PermCath placed on 9/13 Plan: --iHD per nephro   Severe sepsis [facility]) Patient met sepsis criteria with tachycardia, severe leukocytosis and severe sepsis with lactic acidosis above 4.3.  Initial procalcitonin was 60.67 which is decreasing. Did develop a low-grade fever overnight again.  Procalcitonin improving.  Preliminary blood cultures negative, urine cultures with Klebsiella pneumonia which shows only resistance to ampicillin and Augmentin.  Patient did received ceftriaxone for the past 3 days.  Vancomycin was added on 10/16/2022.  Left knee aspirate with no growth so far. -ID consult -Completed the course of antibiotic and ID is advising monitoring without antibiotic for now.    Leukocytosis Slowly improving, pathologist smear review with immature granulocytes, initial smear review with bandemia.  Urine cultures with Klebsiella pneumonia but patient is being treated with appropriate antibiotics. Significantly elevated D-dimer which continued to get worse.  VTE has been ruled out with negative VQ scan and lower extremity venous Doppler. -Completed the course of antibiotic -Hematology consult-ordered some more labs, likely leukemoid reaction with severe inflammation and infection but due to persistent elevation patient underwent bone marrow biopsy on 9/10-pending results   Elevated d-dimer Patient with worsening D-dimer, now at 13.  COVID was negative. DVT has been ruled out with negative VQ scan and lower extremity venous Doppler.   Acute hypoxemic respiratory failure (HCC) Patient did develop acute hypoxic respiratory failure and repeat chest x-ray at that time with concern of pulmonary congestion.  Echocardiogram with normal EF and grade 1 diastolic dysfunction.  VQ scan negative for PE Patient with significant AKI, also concern of NSAID induced renal injury. Started on dialysis. --weaned down on room air  Frequent falls Patient with history of recent decline, poor appetite and multiple falls.  First fall was on July 4 and since then he was falling couple of times a week.  Left knee imaging done on 8/23 with a concern of nondisplaced fibular neck fracture, Initial plan was outpatient follow-up with orthopedic surgery. Requested orthopedic surgery to evaluate while he is in hospital B12 normal, significantly low vitamin D-started on supplement. PT/OT are recommending SNF   Nicotine dependence, uncomplicated -Nicotine patch   Traumatic rhabdomyolysis (HCC) Likely secondary to recurrent falls.   Hyperkalemia Improved, some worsening despite getting Lokelma, so it was switched with Veltassa but potassium continue to get worse. -  Continuing Veltassa  --iHD per  nephro   Left ankle pain Patient continued to have worsening pain and edema of left ankle prompted an MRI of left ankle as initial imaging was negative for any fracture. MRI with complete tear of talofibular ligament and multiple other ligamental injuries. -Podiatry was consulted-recommending conservative management with pain control and CAM boot for 4 to 6 weeks and outpatient follow-up -Continue with pain management    DVT prophylaxis: Heparin SQ Code Status: Full code  Family Communication: son updated at bedside today Level of care: Telemetry Medical Dispo:   The patient is from: home Anticipated d/c is to: SNF rehab Anticipated d/c date is: whenever dialysis is set up as outpatient   Subjective and Interval History:  No complaint today.  Pt reported making good amount of urine.   Objective: Vitals:   10/29/22 0500 10/29/22 0502 10/29/22 0749 10/29/22 1622  BP:  (!) 140/61 138/68 125/67  Pulse:  91 94 90  Resp:  18 16 17   Temp:  98 F (36.7 C) 97.8 F (36.6 C) 98 F (36.7 C)  TempSrc:      SpO2:  96% 98% 97%  Weight: 81.3 kg     Height:        Intake/Output Summary (Last 24 hours) at 10/29/2022 1919 Last data filed at 10/29/2022 1859 Gross per 24 hour  Intake 300 ml  Output 1300 ml  Net -1000 ml   Filed Weights   10/27/22 0853 10/28/22 0500 10/29/22 0500  Weight: 79.7 kg 80.1 kg 81.3 kg    Examination:   Constitutional: NAD, AAOx3 HEENT: conjunctivae and lids normal, EOMI CV: No cyanosis.   RESP: normal respiratory effort, on RA Neuro: II - XII grossly intact.   Psych: Normal mood and affect.  Appropriate judgement and reason   Data Reviewed: I have personally reviewed labs and imaging studies  Time spent: 25 minutes  Darlin Priestly, MD [facility] If 7PM-7AM, please contact night-coverage 10/29/2022, 7:19 PM

## 2022-10-29 NOTE — Plan of Care (Signed)

## 2022-10-29 NOTE — Plan of Care (Signed)

## 2022-10-29 NOTE — Progress Notes (Signed)
Physical Therapy Treatment Patient Details Name: Joshua Mora MRN: [id number] DOB: 1950/12/27 Today's Date: 10/29/2022   History of Present Illness Pt is a 72 year old presenting to the ED with progressive weakness and frequent falls; admitted with AKI, leukocytosis  PMH significant for osteoarthritis s/p left knee replacement. Pt found to have L fibular head fx. Now with multiple L ankle ligament injuries for which CAM boot is recommended with mobility    PT Comments  Pt found supine in bed with daughter in room. With education and encouragement pt willing to work with therapy, putting for fair to good effort throughout session. Pt continues to be total assist +2 for supine<>sit.  Once sitting upright pt unable to maintain balance, consistently drifting posteriorly but able to temporarily correct with verbal cues. Cue provided to keep hands on knees and to lean forwards allowed pt to keep balance with CGA. Pt attempted 2 STS trials with RW. Pt unable to stand fully upright with self selected strategy needing total assist. Second trial performed with cues for hand placement and sequencing with improved results; still needing total A +2 initially, but pt was able to stand further upright with Mod A +2 at best, from elevated surface. Pt will benefit from continued PT services upon discharge to safely address deficits listed in patient problem list for decreased caregiver assistance and eventual return to PLOF.    If plan is discharge home, recommend the following: Two people to help with walking and/or transfers;Two people to help with bathing/dressing/bathroom;Help with stairs or ramp for entrance;Assist for transportation   Can travel by private vehicle     No  Equipment Recommendations  Other (comment) (to be determined at next level of care)    Recommendations for Other Services       Precautions / Restrictions Precautions Precautions: Fall Required Braces or Orthoses: Other  Brace Other Brace: CAM walker off while in bed, prevalons in bed Restrictions Weight Bearing Restrictions: Yes LLE Weight Bearing: Weight bearing as tolerated Other Position/Activity Restrictions: with CAM walker in place     Mobility  Bed Mobility Overal bed mobility: Needs Assistance Bed Mobility: Supine to Sit, Sit to Supine     Supine to sit: Total assist, +2 for physical assistance Sit to supine: Total assist, +2 for physical assistance   General bed mobility comments: CAM walker boot placed on LLE prior to mobilizing. significant assistance needed bed mobility today    Transfers Overall transfer level: Needs assistance Equipment used: Rolling walker (2 wheels) Transfers: Sit to/from Stand Sit to Stand: +2 physical assistance, Mod assist, Total assist, From elevated surface           General transfer comment: Pt attempted to stand x2, inital attempt pt was using self selected strategy. Second trial pt able to stand further upright with cues provided for hand placement and sequencing. for both attempts pt needing total assist for intial boost. second trial the bed was elevated, and once standing upright, pt progressd to only needing mod A.    Ambulation/Gait               General Gait Details: Unable to take any steps one standing today   Stairs             Wheelchair Mobility     Tilt Bed    Modified Rankin (Stroke Patients Only)       Balance Overall balance assessment: Needs assistance Sitting-balance support: Bilateral upper extremity supported, Feet unsupported Sitting balance-Leahy Scale:  Fair Sitting balance - Comments: cues to prevent posterior lean. min-mod A intermittently for assist. Cue pt to place hand on knees for improved forwards lean with good results, pt only needing CGA with this position. Postural control: Posterior lean   Standing balance-Leahy Scale: Poor Standing balance comment: pt needing mod A as best to maintain  standing for ~5 sec                            Cognition Arousal: Alert Behavior During Therapy: WFL for tasks assessed/performed, Flat affect Overall Cognitive Status: Within Functional Limits for tasks assessed                                 General Comments: Pt more agreeable to therapy today, continue with encouragement and education        Exercises Other Exercises Other Exercises: pt provided extensive education and encouragement to work with PT/OT when treatment sessions are attepted. also educated pt on frequent mobilization to maintain funcitonal strengthand prevent further decline. Pt and daught verbalized understanding.    General Comments        Pertinent Vitals/Pain Pain Assessment Pain Assessment: 0-10 Pain Score: 10-Worst pain ever Pain Descriptors / Indicators: Discomfort, Grimacing, Guarding, Aching, Moaning Pain Intervention(s): Monitored during session, Repositioned, Limited activity within patient's tolerance    Home Living                          Prior Function            PT Goals (current goals can now be found in the care plan section) Progress towards PT goals: Progressing toward goals    Frequency    Min 1X/week      PT Plan      Co-evaluation              AM-PAC PT "6 Clicks" Mobility   Outcome Measure  Help needed turning from your back to your side while in a flat bed without using bedrails?: A Lot Help needed moving from lying on your back to sitting on the side of a flat bed without using bedrails?: Total Help needed moving to and from a bed to a chair (including a wheelchair)?: Total Help needed standing up from a chair using your arms (e.g., wheelchair or bedside chair)?: Total Help needed to walk in hospital room?: Total Help needed climbing 3-5 steps with a railing? : Total 6 Click Score: 7    End of Session Equipment Utilized During Treatment: Gait belt;Other (comment) (CAM boot  on LLE) Activity Tolerance: Patient limited by pain;Patient limited by fatigue Patient left: in bed;with call bell/phone within reach;with bed alarm set;with family/visitor present Nurse Communication: Mobility status PT Visit Diagnosis: Muscle weakness (generalized) (M62.81);Repeated falls (R29.6);Difficulty in walking, not elsewhere classified (R26.2)     Time: 1110-1140 PT Time Calculation (min) (ACUTE ONLY): 30 min  Charges:                            Cecile Sheerer, SPT 10/29/22, 12:57 PM

## 2022-10-30 DIAGNOSIS — N179 Acute kidney failure, unspecified: Secondary | ICD-10-CM | POA: Diagnosis not present

## 2022-10-30 LAB — BASIC METABOLIC PANEL
Anion gap: 11 (ref 5–15)
BUN: 61 mg/dL — ABNORMAL HIGH (ref 8–23)
CO2: 25 mmol/L (ref 22–32)
Calcium: 8.7 mg/dL — ABNORMAL LOW (ref 8.9–10.3)
Chloride: 100 mmol/L (ref 98–111)
Creatinine, Ser: 2.8 mg/dL — ABNORMAL HIGH (ref 0.61–1.24)
GFR, Estimated: 23 mL/min — ABNORMAL LOW (ref >60)
Glucose, Bld: 105 mg/dL — ABNORMAL HIGH (ref 70–99)
Potassium: 4 mmol/L (ref 3.5–5.1)
Sodium: 136 mmol/L (ref 135–145)

## 2022-10-30 LAB — CBC
HCT: 28.9 % — ABNORMAL LOW (ref 39.0–52.0)
Hemoglobin: 9.5 g/dL — ABNORMAL LOW (ref 13.0–17.0)
MCH: 30.4 pg (ref 26.0–34.0)
MCHC: 32.9 g/dL (ref 30.0–36.0)
MCV: 92.6 fL (ref 80.0–100.0)
Platelets: 350 10*3/uL (ref 150–400)
RBC: 3.12 MIL/uL — ABNORMAL LOW (ref 4.22–5.81)
RDW: 16.1 % — ABNORMAL HIGH (ref 11.5–15.5)
WBC: 23.8 10*3/uL — ABNORMAL HIGH (ref 4.0–10.5)
nRBC: 0 % (ref 0.0–0.2)

## 2022-10-30 LAB — MAGNESIUM: Magnesium: 2.2 mg/dL (ref 1.7–2.4)

## 2022-10-30 LAB — GLUCOSE, CAPILLARY: Glucose-Capillary: 86 mg/dL (ref 70–99)

## 2022-10-30 NOTE — Plan of Care (Signed)
Problem: Education: Goal: Knowledge of General Education information will improve Description: Including pain rating scale, medication(s)/side effects and non-pharmacologic comfort measures Outcome: Progressing   Problem: Health Behavior/Discharge Planning: Goal: Ability to manage health-related needs will improve Outcome: Progressing   Problem: Clinical Measurements: Goal: Ability to maintain clinical measurements within normal limits will improve Outcome: Progressing Goal: Diagnostic test results will improve Outcome: Progressing Goal: Respiratory complications will improve Outcome: Progressing   Problem: Activity: Goal: Risk for activity intolerance will decrease Outcome: Progressing   Problem: Nutrition: Goal: Adequate nutrition will be maintained Outcome: Progressing   Problem: Coping: Goal: Level of anxiety will decrease Outcome: Progressing

## 2022-10-30 NOTE — Progress Notes (Signed)
  Chaplain On-Call responded to call from Unit Secretary Darl Pikes, who reported the patient's request for discussion of Advance Directives.  Chaplain met the patient and his daughter Synetta Fail. They presented questions about the [facility] document, stating that they want to have the document naming Synetta Fail as the patient's HCPOA.  Chaplain provided the Advance Directives packet to them and offered education regarding the HCPOA form. Chaplain also discussed the process for completion if patient wishes to do this while in the hospital. They stated their understanding.  Chaplain will refer to the next Chaplain on Monday to follow-up, due no available Employee Notary today.  Chaplain Evelena Peat M.Div., [facility]

## 2022-10-30 NOTE — Progress Notes (Signed)
PROGRESS NOTE    Joshua Mora  ZOX:[id number] DOB: April 04, 1950 DOA: 10/13/2022 PCP: Sandrea Hughs, NP  139A/139A-AA  LOS: 17 days   Brief hospital course:   Assessment & Plan: Joshua Mora is a 72 y.o. male with medical history significant of alcohol abuse, tobacco use disorder, diet controlled prediabetes, CKD 3B, osteoarthritis s/p left knee replacement who presented to the ED with complaints of progressive weakness and frequent falls over the past 1 month.     On presentation he was found to have severely elevated creatinine above baseline for which nephrology was consulted.  Also with significant leukocytosis above 46,000 with concern for leukemoid reaction in the setting of presumptive UTI.  Trauma imaging were nonacute.  CT head also nonacute.  Hospital course was complicated by acute hypoxic respiratory failure.  Was found to have mild pulmonary edema on chest x-ray.  Elevated D-dimer above 11 with concern for possible pulmonary embolism.  Heparin drip initiated, d/c'ed when V/Q scan and DVT neg.  * Acute renal failure (HCC) Now on HD Patient with no prior diagnosis but came with significant AKI and oliguria on admission. History of regular NSAID use which can be contributory. --PermCath placed on 9/13 Plan: --iHD per nephro   Severe sepsis [facility]) Patient met sepsis criteria with tachycardia, severe leukocytosis and severe sepsis with lactic acidosis above 4.3.  Initial procalcitonin was 60.67 which is decreasing. Did develop a low-grade fever overnight again.  Procalcitonin improving.  Preliminary blood cultures negative, urine cultures with Klebsiella pneumonia which shows only resistance to ampicillin and Augmentin.  Patient did received ceftriaxone for the past 3 days.  Vancomycin was added on 10/16/2022.  Left knee aspirate with no growth so far. -ID consult -Completed the course of antibiotic and ID is advising monitoring without antibiotic for now.    Leukocytosis Slowly improving, pathologist smear review with immature granulocytes, initial smear review with bandemia.  Urine cultures with Klebsiella pneumonia but patient is being treated with appropriate antibiotics. Significantly elevated D-dimer which continued to get worse.  VTE has been ruled out with negative VQ scan and lower extremity venous Doppler. -Completed the course of antibiotic -Hematology consult-ordered some more labs, likely leukemoid reaction with severe inflammation and infection but due to persistent elevation patient underwent bone marrow biopsy on 9/10-pending results   Elevated d-dimer Patient with worsening D-dimer, now at 13.  COVID was negative. DVT has been ruled out with negative VQ scan and lower extremity venous Doppler.   Acute hypoxemic respiratory failure (HCC) Patient did develop acute hypoxic respiratory failure and repeat chest x-ray at that time with concern of pulmonary congestion.  Echocardiogram with normal EF and grade 1 diastolic dysfunction.  VQ scan negative for PE Patient with significant AKI, also concern of NSAID induced renal injury. Started on dialysis. --weaned down on room air  Frequent falls Patient with history of recent decline, poor appetite and multiple falls.  First fall was on July 4 and since then he was falling couple of times a week.  Left knee imaging done on 8/23 with a concern of nondisplaced fibular neck fracture, Initial plan was outpatient follow-up with orthopedic surgery. Requested orthopedic surgery to evaluate while he is in hospital B12 normal, significantly low vitamin D-started on supplement. PT/OT are recommending SNF   Nicotine dependence, uncomplicated -Nicotine patch   Traumatic rhabdomyolysis (HCC) Likely secondary to recurrent falls.   Hyperkalemia Improved, some worsening despite getting Lokelma, so it was switched with Veltassa but potassium continue to get worse. -  Continuing Veltassa  --iHD per  nephro   Left ankle pain Patient continued to have worsening pain and edema of left ankle prompted an MRI of left ankle as initial imaging was negative for any fracture. MRI with complete tear of talofibular ligament and multiple other ligamental injuries. -Podiatry was consulted-recommending conservative management with pain control and CAM boot for 4 to 6 weeks and outpatient follow-up -Continue with pain management    DVT prophylaxis: Heparin SQ Code Status: Full code  Family Communication: daughter updated at bedside today Level of care: Telemetry Medical Dispo:   The patient is from: home Anticipated d/c is to: SNF rehab Anticipated d/c date is: whenever bed available   Subjective and Interval History:  Pt complained of left ankle pain today.  Not eating well.   Objective: Vitals:   10/29/22 1622 10/30/22 0109 10/30/22 0830 10/30/22 1503  BP: 125/67 121/62 128/64 118/69  Pulse: 90 90 95 95  Resp: 17 18 18 18   Temp: 98 F (36.7 C) 98.5 F (36.9 C) 97.6 F (36.4 C) (!) 97.5 F (36.4 C)  TempSrc:      SpO2: 97% 96% 100% 95%  Weight:      Height:        Intake/Output Summary (Last 24 hours) at 10/30/2022 1816 Last data filed at 10/30/2022 0600 Gross per 24 hour  Intake 100 ml  Output 800 ml  Net -700 ml   Filed Weights   10/27/22 0853 10/28/22 0500 10/29/22 0500  Weight: 79.7 kg 80.1 kg 81.3 kg    Examination:   Constitutional: NAD, AAOx3 HEENT: conjunctivae and lids normal, EOMI CV: No cyanosis.   RESP: normal respiratory effort, on RA Neuro: II - XII grossly intact.   Psych: Normal mood and affect.  Appropriate judgement and reason   Data Reviewed: I have personally reviewed labs and imaging studies  Time spent: 35 minutes  Darlin Priestly, MD [facility] If 7PM-7AM, please contact night-coverage 10/30/2022, 6:16 PM

## 2022-10-30 NOTE — Progress Notes (Signed)
[facility]  ROUNDING NOTE   Subjective:   Daughter at bedside.  Patient with no complaints. Patient worked with PT yesterday.  UOP 1100 ( ) Creatinine 2.8 (2.75) (2.73)  Last hemodialysis treatment on 9/12.   Continues to have left lower extremity pain   Objective:  Vital signs in last 24 hours:  Temp:  [97.6 F (36.4 C)-98.5 F (36.9 C)] 97.6 F (36.4 C) (09/15 0830) Pulse Rate:  [90-95] 95 (09/15 0830) Resp:  [17-18] 18 (09/15 0830) BP: (121-128)/(62-67) 128/64 (09/15 0830) SpO2:  [96 %-100 %] 100 % (09/15 0830)  Weight change:  Filed Weights   10/27/22 0853 10/28/22 0500 10/29/22 0500  Weight: 79.7 kg 80.1 kg 81.3 kg    Intake/Output: I/O last 3 completed shifts: In: 400 [P.O.:400] Out: 1800 [Urine:1800]   Intake/Output this shift:  No intake/output data recorded.  Physical Exam: General: NAD, laying in bed  Head: Normocephalic, atraumatic. Moist oral mucosal membranes  Eyes: Anicteric, PERRL  Neck: Supple, trachea midline  Lungs:  Clear to auscultation  Heart: Regular rate and rhythm  Abdomen:  Soft, nontender,   Extremities:  + peripheral edema.  Neurologic: Nonfocal, moving all four extremities  Skin: No lesions  Access: RIJ permcath    Basic Metabolic Panel: Recent Labs  Lab 10/24/22 0423 10/25/22 0453 10/25/22 1258 10/25/22 1615 10/26/22 0528 10/26/22 1008 10/26/22 1334 10/27/22 0403 10/28/22 0854 10/29/22 0426 10/30/22 0531  NA 136 138 138  --  137  --   --  138 137 137 136  K 5.5* 6.1* 6.2*   < > 5.0   < > 4.8 4.8 5.2* 4.2 4.0  CL 99 105 104  --  102  --   --  101 98 102 100  CO2 24 24 22   --  25  --   --  24 27 25 25   GLUCOSE 120* 112* 106*  --  102*  --   --  119* 103* 120* 105*  BUN 88* 95* 92*  --  67*  --   --  79* 62* 65* 61*  CREATININE 3.73* 3.47* 3.50*  --  2.77*  --   --  3.12* 2.73* 2.75* 2.80*  CALCIUM 8.3* 8.9 8.7*  --  8.2*  --   --  8.5* 8.4* 8.6* 8.7*  MG  --   --   --   --   --   --   --   --    --  2.2 2.2  PHOS 6.8* 5.8* 6.4*  --  5.5*  --   --  6.0*  --   --   --    < > = values in this interval not displayed.    Liver Function Tests: Recent Labs  Lab 10/24/22 0423 10/25/22 0453 10/25/22 1258 10/26/22 0528 10/27/22 0403  ALBUMIN 2.3* 2.4* 2.3* 2.2* 2.2*   No results for input(s): "LIPASE", "AMYLASE" in the last 168 hours. No results for input(s): "AMMONIA" in the last 168 hours.  CBC: Recent Labs  Lab 10/24/22 0423 10/25/22 0453 10/27/22 0403 10/28/22 0402 10/29/22 0426 10/30/22 0531  WBC 29.8* 27.9* 23.0* 29.8* 25.1* 23.8*  NEUTROABS 23.8* 22.6* 17.8* 24.2*  --   --   HGB 10.2* 10.4* 9.0* 9.8* 9.5* 9.5*  HCT 29.8* 32.3* 26.7* 29.8* 28.9* 28.9*  MCV 88.2 91.5 89.3 92.3 90.6 92.6  PLT 597* 692* 463* 355 351 350    Cardiac Enzymes: No results for input(s): "CKTOTAL", "CKMB", "CKMBINDEX", "TROPONINI" in the last 168  hours.  BNP: Invalid input(s): "POCBNP"  CBG: Recent Labs  Lab 10/24/22 1010 10/25/22 0816 10/28/22 0810 10/28/22 1648 10/30/22 0832  GLUCAP 120* 92 89 111* 86    Microbiology: Results for orders placed or performed during the hospital encounter of 10/13/22  SARS Coronavirus 2 by RT PCR (hospital order, performed in [facility] hospital lab) *cepheid single result test* Anterior Nasal Swab     Status: None   Collection Time: 10/13/22  3:55 PM   Specimen: Anterior Nasal Swab  Result Value Ref Range Status   SARS Coronavirus 2 by RT PCR NEGATIVE NEGATIVE Final    Comment: (NOTE) SARS-CoV-2 target nucleic acids are NOT DETECTED.  The SARS-CoV-2 RNA is generally detectable in upper and lower respiratory specimens during the acute phase of infection. The lowest concentration of SARS-CoV-2 viral copies this assay can detect is 250 copies / mL. A negative result does not preclude SARS-CoV-2 infection and should not be used as the sole basis for treatment or other patient management decisions.  A negative result may occur with improper  specimen collection / handling, submission of specimen other than nasopharyngeal swab, presence of viral mutation(s) within the areas targeted by this assay, and inadequate number of viral copies (<250 copies / mL). A negative result must be combined with clinical observations, patient history, and epidemiological information.  Fact Sheet for Patients:   RoadLapTop.co.za  Fact Sheet for Healthcare Providers: http://kim-miller.com/  This test is not yet approved or  cleared by the [place] FDA and has been authorized for detection and/or diagnosis of SARS-CoV-2 by FDA under an Emergency Use Authorization (EUA).  This EUA will remain in effect (meaning this test can be used) for the duration of the COVID-19 declaration under Section 564(b)(1) of the Act, 21 U.S.C. section 360bbb-3(b)(1), unless the authorization is terminated or revoked sooner.  Performed at [facility], [address] [place], [place] [address]   MRSA Next Gen by PCR, Nasal     Status: None   Collection Time: 10/13/22  9:51 PM   Specimen: Nasal Mucosa; Nasal Swab  Result Value Ref Range Status   MRSA by PCR Next Gen NOT DETECTED NOT DETECTED Final    Comment: (NOTE) The GeneXpert MRSA Assay (FDA approved for NASAL specimens only), is one component of a comprehensive MRSA colonization surveillance program. It is not intended to diagnose MRSA infection nor to guide or monitor treatment for MRSA infections. Test performance is not FDA approved in patients less than 73 years old. Performed at [facility], [address] [place], [place] [address]   Urine Culture     Status: Abnormal   Collection Time: 10/14/22  7:32 AM   Specimen: Urine, Random  Result Value Ref Range Status   Specimen Description   Final    URINE, RANDOM Performed at [facility], [address] [place], [place] [address]    Special Requests   Final    NONE  Reflexed from [phone] Performed at [facility], [address] [place], [place] [address]    Culture >=100,000 COLONIES/mL KLEBSIELLA PNEUMONIAE (A)  Final   Report Status 10/16/2022 FINAL  Final   Organism ID, Bacteria KLEBSIELLA PNEUMONIAE (A)  Final      Susceptibility   Klebsiella pneumoniae - MIC*    AMPICILLIN >=32 RESISTANT Resistant     CEFAZOLIN <=4 SENSITIVE Sensitive     CEFEPIME <=0.12 SENSITIVE Sensitive     CEFTRIAXONE <=0.25 SENSITIVE Sensitive  CIPROFLOXACIN <=0.25 SENSITIVE Sensitive     GENTAMICIN <=1 SENSITIVE Sensitive     IMIPENEM <=0.25 SENSITIVE Sensitive     NITROFURANTOIN 128 RESISTANT Resistant     TRIMETH/SULFA <=20 SENSITIVE Sensitive     AMPICILLIN/SULBACTAM 8 SENSITIVE Sensitive     PIP/TAZO <=4 SENSITIVE Sensitive     * >=100,000 COLONIES/mL KLEBSIELLA PNEUMONIAE  Culture, blood (Routine X 2) w Reflex to ID Panel     Status: None   Collection Time: 10/15/22  1:29 AM   Specimen: Right Antecubital; Blood  Result Value Ref Range Status   Specimen Description RIGHT ANTECUBITAL  Final   Special Requests   Final    BOTTLES DRAWN AEROBIC AND ANAEROBIC Blood Culture adequate volume   Culture   Final    NO GROWTH 5 DAYS Performed at [facility], [address] [place], [place] [address]    Report Status 10/20/2022 FINAL  Final  Culture, blood (Routine X 2) w Reflex to ID Panel     Status: None   Collection Time: 10/15/22 11:41 AM   Specimen: BLOOD  Result Value Ref Range Status   Specimen Description BLOOD BLOOD RIGHT HAND  Final   Special Requests   Final    BOTTLES DRAWN AEROBIC ONLY Blood Culture results may not be optimal due to an inadequate volume of blood received in culture bottles   Culture   Final    NO GROWTH 5 DAYS Performed at [facility], [address], [place], [place] [address]    Report Status 10/20/2022 FINAL  Final  Body fluid culture w Gram Stain     Status: None   Collection Time: 10/17/22   2:02 PM   Specimen: Synovium; Body Fluid  Result Value Ref Range Status   Specimen Description   Final    SYNOVIAL Performed at [facility], [address] [place], [place] [address]    Special Requests   Final    Normal Performed at [facility], [address] [place], [place] [address]    Gram Stain   Final    FEW WBC PRESENT,BOTH PMN AND MONONUCLEAR NO ORGANISMS SEEN    Culture   Final    NO GROWTH 3 DAYS Performed at [facility], [address], [place], [place] [address]    Report Status 10/21/2022 FINAL  Final    Coagulation Studies: No results for input(s): "LABPROT", "INR" in the last 72 hours.  Urinalysis: No results for input(s): "COLORURINE", "LABSPEC", "PHURINE", "GLUCOSEU", "HGBUR", "BILIRUBINUR", "KETONESUR", "PROTEINUR", "UROBILINOGEN", "NITRITE", "LEUKOCYTESUR" in the last 72 hours.  Invalid input(s): "APPERANCEUR"    Imaging: PERIPHERAL VASCULAR CATHETERIZATION  Result Date: 10/28/2022 See surgical note for result.    Medications:    sodium chloride     calcium gluconate     sodium chloride      (feeding supplement) PROSource Plus  30 mL Oral BID BM   vitamin C  500 mg Oral BID   Chlorhexidine Gluconate Cloth  6 each Topical Q0600   cyanocobalamin  1,000 mcg Oral Daily   insulin aspart  10 Units Intravenous Once   And   dextrose  1 ampule Intravenous Once   feeding supplement (NEPRO CARB STEADY)  237 mL Oral TID BM   folic acid  1 mg Oral Daily   heparin injection (subcutaneous)  5,000 Units Subcutaneous Q8H   multivitamin  1 tablet Oral QHS   nicotine  14 mg Transdermal Daily   thiamine  100  mg Oral Daily   Or   thiamine  100 mg Intravenous Daily   Vitamin D (Ergocalciferol)  50,000 Units Oral Q7 days   acetaminophen, chlorproMAZINE, heparin, melatonin, ondansetron **OR** ondansetron (ZOFRAN) IV, mouth rinse, polyethylene glycol, prochlorperazine, traMADol, traZODone  Assessment/ Plan:  Mr. Anan Hoard is a 72 y.o.  male with tobacco use, alcohol abuse, osteoarthritis status post left knee replacement and hyperlipidemia, who is admitted on 10/13/2022 for AKI (acute kidney injury) (HCC) [N17.9]  Acute Kidney Injury on chronic kidney disease stage IIIB: Baseline creatinine of 1.74, GFR of 41 on 08/18/22. Suspect patient's acute kidney injury secondary to NSAID induced nephropathy and concurrent sepsis. No history of diabetes or hypertension per patient. Renal ultrasound with no obstruction. No IV contrast. -Underwent first hemodialysis treatment on 10/15/2022, first series lasted until 10/21/22.  Then resumed hemodialysis on 9/10 and 9/12.  - Hold dialysis for today and monitor for renal recovery.  - Nonoliguric urine output. No acute indication for dialysis at this time.   2. Urinary tract infection with sepsis: Klebsiella species.  - completed antibiotics course  3.  Anemia with renal failure: hemoglobin 9.5. Has received EPO. Stable.  4. 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     LOS: 17 Lorie Cleckley 9/15/202410:43 AM

## 2022-10-31 DIAGNOSIS — D72829 Elevated white blood cell count, unspecified: Secondary | ICD-10-CM | POA: Diagnosis not present

## 2022-10-31 DIAGNOSIS — N179 Acute kidney failure, unspecified: Secondary | ICD-10-CM | POA: Diagnosis not present

## 2022-10-31 LAB — GLUCOSE, CAPILLARY: Glucose-Capillary: 88 mg/dL (ref 70–99)

## 2022-10-31 LAB — CBC
HCT: 28.8 % — ABNORMAL LOW (ref 39.0–52.0)
Hemoglobin: 9.4 g/dL — ABNORMAL LOW (ref 13.0–17.0)
MCH: 30 pg (ref 26.0–34.0)
MCHC: 32.6 g/dL (ref 30.0–36.0)
MCV: 92 fL (ref 80.0–100.0)
Platelets: 320 10*3/uL (ref 150–400)
RBC: 3.13 MIL/uL — ABNORMAL LOW (ref 4.22–5.81)
RDW: 16.1 % — ABNORMAL HIGH (ref 11.5–15.5)
WBC: 21.4 10*3/uL — ABNORMAL HIGH (ref 4.0–10.5)
nRBC: 0 % (ref 0.0–0.2)

## 2022-10-31 LAB — BASIC METABOLIC PANEL
Anion gap: 9 (ref 5–15)
BUN: 53 mg/dL — ABNORMAL HIGH (ref 8–23)
CO2: 24 mmol/L (ref 22–32)
Calcium: 8.7 mg/dL — ABNORMAL LOW (ref 8.9–10.3)
Chloride: 104 mmol/L (ref 98–111)
Creatinine, Ser: 2.57 mg/dL — ABNORMAL HIGH (ref 0.61–1.24)
GFR, Estimated: 25 mL/min — ABNORMAL LOW (ref >60)
Glucose, Bld: 99 mg/dL (ref 70–99)
Potassium: 4.1 mmol/L (ref 3.5–5.1)
Sodium: 137 mmol/L (ref 135–145)

## 2022-10-31 LAB — MAGNESIUM: Magnesium: 2.1 mg/dL (ref 1.7–2.4)

## 2022-10-31 MED ORDER — MORPHINE SULFATE (PF) 2 MG/ML IV SOLN: 1.0000 mg | INTRAVENOUS | Status: DC | PRN

## 2022-10-31 MED ORDER — TRAMADOL HCL 50 MG PO TABS
50.0000 mg | ORAL_TABLET | Freq: Once | ORAL | Status: DC
  Filled 2022-10-31: qty 1

## 2022-10-31 NOTE — Progress Notes (Addendum)
PROGRESS NOTE    Joshua Mora  BMW:[id number] DOB: 06-25-1950 DOA: 10/13/2022 PCP: Sandrea Hughs, NP  139A/139A-AA  LOS: 18 days   Brief hospital course:   Assessment & Plan: Joshua Mora is a 72 y.o. male with medical history significant of alcohol abuse, tobacco use disorder, diet controlled prediabetes, CKD 3B, osteoarthritis s/p left knee replacement who presented to the ED with complaints of progressive weakness and frequent falls over the past 1 month.     On presentation he was found to have severely elevated creatinine above baseline for which nephrology was consulted.  Also with significant leukocytosis above 46,000 with concern for leukemoid reaction in the setting of presumptive UTI.  Trauma imaging were nonacute.  CT head also nonacute.  Hospital course was complicated by acute hypoxic respiratory failure.  Was found to have mild pulmonary edema on chest x-ray.  Elevated D-dimer above 11 with concern for possible pulmonary embolism.  Heparin drip initiated, d/c'ed when V/Q scan and DVT neg.  * Acute renal failure (HCC) Now on HD Patient with no prior diagnosis but came with significant AKI and oliguria on admission. History of regular NSAID use which can be contributory. --PermCath placed on 9/13 Plan: --iHD per nephro   Severe sepsis [facility]) Patient met sepsis criteria with tachycardia, severe leukocytosis and severe sepsis with lactic acidosis above 4.3.  Initial procalcitonin was 60.67 which is decreasing. Did develop a low-grade fever overnight again.  Procalcitonin improving.  Preliminary blood cultures negative, urine cultures with Klebsiella pneumonia which shows only resistance to ampicillin and Augmentin.  Patient did received ceftriaxone for the past 3 days.  Vancomycin was added on 10/16/2022.  Left knee aspirate with no growth so far. -ID consult -Completed the course of antibiotic and ID is advising monitoring without antibiotic for now.    Leukocytosis Slowly improving, pathologist smear review with immature granulocytes, initial smear review with bandemia.  Urine cultures with Klebsiella pneumonia but patient is being treated with appropriate antibiotics. Significantly elevated D-dimer which continued to get worse.  VTE has been ruled out with negative VQ scan and lower extremity venous Doppler. -Completed the course of antibiotic -Hematology consult-ordered some more labs, likely leukemoid reaction with severe inflammation and infection but due to persistent elevation patient underwent bone marrow biopsy on 9/10-pending results   Elevated d-dimer Patient with worsening D-dimer, now at 13.  COVID was negative. DVT has been ruled out with negative VQ scan and lower extremity venous Doppler.   Acute hypoxemic respiratory failure (HCC) Patient did develop acute hypoxic respiratory failure and repeat chest x-ray at that time with concern of pulmonary congestion.  Echocardiogram with normal EF and grade 1 diastolic dysfunction.  VQ scan negative for PE Patient with significant AKI, also concern of NSAID induced renal injury. Started on dialysis. --weaned down on room air  Frequent falls Patient with history of recent decline, poor appetite and multiple falls.  First fall was on July 4 and since then he was falling couple of times a week.  Left knee imaging done on 8/23 with a concern of nondisplaced fibular neck fracture, Initial plan was outpatient follow-up with orthopedic surgery. Requested orthopedic surgery to evaluate while he is in hospital B12 normal, significantly low vitamin D-started on supplement. --SNF rehab   Nicotine dependence, uncomplicated -Nicotine patch   Traumatic rhabdomyolysis (HCC) Likely secondary to recurrent falls.   Hyperkalemia --s/p Lokelma and Veltassa.  Now resolved   Left ankle pain Patient continued to have worsening pain and edema  of left ankle prompted an MRI of left ankle as initial  imaging was negative for any fracture. MRI with complete tear of talofibular ligament and multiple other ligamental injuries. -Podiatry was consulted-recommending conservative management with pain control and CAM boot for 4 to 6 weeks and outpatient follow-up -Continue with pain management    DVT prophylaxis: Heparin SQ Code Status: Full code  Family Communication: daughter updated at bedside today Level of care: Telemetry Medical Dispo:   The patient is from: home Anticipated d/c is to: SNF rehab Anticipated d/c date is: whenever bed available   Subjective and Interval History:  Pt reported not eating well, and not liking the food due to no taste.   Objective: Vitals:   10/30/22 2315 10/31/22 0500 10/31/22 0821 10/31/22 1615  BP: 136/66  129/65 110/61  Pulse: 97  97 (!) 103  Resp: 18  17 16   Temp: 98.2 F (36.8 C)   98 F (36.7 C)  TempSrc:      SpO2: 95%  92% 97%  Weight:  82 kg    Height:        Intake/Output Summary (Last 24 hours) at 10/31/2022 1929 Last data filed at 10/31/2022 0548 Gross per 24 hour  Intake --  Output 1050 ml  Net -1050 ml   Filed Weights   10/28/22 0500 10/29/22 0500 10/31/22 0500  Weight: 80.1 kg 81.3 kg 82 kg    Examination:   Constitutional: NAD, AAOx3 HEENT: conjunctivae and lids normal, EOMI CV: No cyanosis.   RESP: normal respiratory effort, on RA Neuro: II - XII grossly intact.   Psych: flat mood and affect.     Data Reviewed: I have personally reviewed labs and imaging studies  Time spent: 35 minutes  Darlin Priestly, MD [facility] If 7PM-7AM, please contact night-coverage 10/31/2022, 7:29 PM

## 2022-10-31 NOTE — Progress Notes (Signed)
Physical Therapy Treatment Patient Details Name: Joshua Mora MRN: [id number] DOB: 16-Sep-1950 Today's Date: 10/31/2022   History of Present Illness Pt is a 72 year old presenting to the ED with progressive weakness and frequent falls; admitted with AKI, leukocytosis  PMH significant for osteoarthritis s/p left knee replacement. Pt found to have L fibular head fx. Now with multiple L ankle ligament injuries for which CAM boot is recommended with mobility    PT Comments  Pt was pleasant and motivated to participate during the session and put forth good effort throughout. Pt continues to be Max A +2 for bed mobility, but it putting forth greater effort with participation during session compared to previous sessions. Once sitting EOB, pt needing intermittent assist to maintain balance, providing cues for forwards weight shift and forward lean. Pt able to perform STS x3 with RW and Mod A +2. First 2 trials performed pt able to stand for ~2 min before needing to sit back down. For third trial pt given cues to try and perform shuffling side steps towards [facility]; ultimately unable to complete steps or weight shift successfully despite various cues and assist for set-up and sequencing. Pt will benefit from continued PT services upon discharge to safely address deficits listed in patient problem list for decreased caregiver assistance and eventual return to PLOF.    If plan is discharge home, recommend the following: Two people to help with walking and/or transfers;Two people to help with bathing/dressing/bathroom;Help with stairs or ramp for entrance;Assist for transportation   Can travel by private vehicle     No  Equipment Recommendations  Other (comment) (TBD at next venue of care)    Recommendations for Other Services       Precautions / Restrictions Precautions Precautions: Fall Required Braces or Orthoses: Other Brace Other Brace: CAM walker off while in bed, prevalons in  bed Restrictions Weight Bearing Restrictions: Yes LLE Weight Bearing: Weight bearing as tolerated Other Position/Activity Restrictions: with CAM walker in place     Mobility  Bed Mobility Overal bed mobility: Needs Assistance Bed Mobility: Supine to Sit, Sit to Supine     Supine to sit: +2 for physical assistance, Max assist Sit to supine: +2 for physical assistance, Max assist   General bed mobility comments: CAM walker boot placed on LLE prior to mobilizing. significant assistance needed bed mobility today    Transfers Overall transfer level: Needs assistance Equipment used: Rolling walker (2 wheels) Transfers: Sit to/from Stand Sit to Stand: Mod assist, +2 physical assistance           General transfer comment: Pt able to perform STS x3 with mod A each time, able to stand for ~2 min for first 2 trials, able to stand for slightly less time during final trial.    Ambulation/Gait               General Gait Details: Attempted again, but pt unable to take any steps once standing today with CAM boot donned   Stairs             Wheelchair Mobility     Tilt Bed    Modified Rankin (Stroke Patients Only)       Balance Overall balance assessment: Needs assistance Sitting-balance support: Bilateral upper extremity supported, Feet unsupported Sitting balance-Leahy Scale: Fair Sitting balance - Comments: needing intermitted assits to maintain balance, consistent cues provided for forward weight shift and lean to prevent posterior drift. Postural control: Posterior lean Standing balance support: Bilateral upper  extremity supported, During functional activity Standing balance-Leahy Scale: Poor Standing balance comment: Pt needing consistent mod A with several postural cues to maintain standing balance.                            Cognition Arousal: Alert Behavior During Therapy: WFL for tasks assessed/performed Overall Cognitive Status: Within  Functional Limits for tasks assessed                                          Exercises      General Comments        Pertinent Vitals/Pain Pain Assessment Pain Assessment: 0-10 Pain Score: 10-Worst pain ever Pain Location: L ankle, lower part of L knee Pain Descriptors / Indicators: Discomfort, Grimacing, Guarding, Aching, Moaning Pain Intervention(s): Monitored during session, Limited activity within patient's tolerance    Home Living Family/patient expects to be discharged to:: Private residence Living Arrangements: Children;Other relatives Available Help at Discharge: Family Type of Home: House Home Access: Ramped entrance   Entrance Stairs-Number of Steps: through garage   Home Layout: One level        Prior Function            PT Goals (current goals can now be found in the care plan section) Progress towards PT goals: Progressing toward goals    Frequency    Min 1X/week      PT Plan      Co-evaluation PT/OT/SLP Co-Evaluation/Treatment: Yes Reason for Co-Treatment: Complexity of the patient's impairments (multi-system involvement);To address functional/ADL transfers PT goals addressed during session: Mobility/safety with mobility;Proper use of DME        AM-PAC PT "6 Clicks" Mobility   Outcome Measure  Help needed turning from your back to your side while in a flat bed without using bedrails?: A Lot Help needed moving from lying on your back to sitting on the side of a flat bed without using bedrails?: A Lot Help needed moving to and from a bed to a chair (including a wheelchair)?: Total Help needed standing up from a chair using your arms (e.g., wheelchair or bedside chair)?: Total Help needed to walk in hospital room?: Total Help needed climbing 3-5 steps with a railing? : Total 6 Click Score: 8    End of Session Equipment Utilized During Treatment: Other (comment) (CAM boot on LLE) Activity Tolerance: Patient limited by  pain;Patient limited by fatigue Patient left: in bed;with call bell/phone within reach;with bed alarm set;with family/visitor present Nurse Communication: Mobility status PT Visit Diagnosis: Muscle weakness (generalized) (M62.81);Repeated falls (R29.6);Difficulty in walking, not elsewhere classified (R26.2)     Time: 1610-9604 PT Time Calculation (min) (ACUTE ONLY): 30 min  Charges:                            Cecile Sheerer, SPT 10/31/22, 4:54 PM

## 2022-10-31 NOTE — Progress Notes (Signed)
[facility]  ROUNDING NOTE   Subjective:   Patient sitting up in bed Daughter arrived to bedside  Partially completed breakfast tray at bedside   UOP 1050  Creatinine 2.57 (2.8) (2.75) (2.73)  Last hemodialysis treatment on 9/12.    Objective:  Vital signs in last 24 hours:  Temp:  [97.5 F (36.4 C)-98.2 F (36.8 C)] 98.2 F (36.8 C) (09/15 2315) Pulse Rate:  [95-97] 97 (09/16 0821) Resp:  [17-18] 17 (09/16 0821) BP: (118-136)/(65-69) 129/65 (09/16 0821) SpO2:  [92 %-95 %] 92 % (09/16 0821) Weight:  [82 kg] 82 kg (09/16 0500)  Weight change:  Filed Weights   10/28/22 0500 10/29/22 0500 10/31/22 0500  Weight: 80.1 kg 81.3 kg 82 kg    Intake/Output: I/O last 3 completed shifts: In: 220 [P.O.:220] Out: 1550 [Urine:1550]   Intake/Output this shift:  No intake/output data recorded.  Physical Exam: General: NAD  Head: Normocephalic, atraumatic. Moist oral mucosal membranes  Eyes: Anicteric  Neck: Supple  Lungs:  Clear to auscultation  Heart: Regular rate and rhythm  Abdomen:  Soft, nontender,   Extremities:  + peripheral edema.  Neurologic: Nonfocal, moving all four extremities  Skin: No lesions  Access: RIJ permcath    Basic Metabolic Panel: Recent Labs  Lab 10/25/22 0453 10/25/22 1258 10/25/22 1615 10/26/22 0528 10/26/22 1008 10/27/22 0403 10/28/22 0854 10/29/22 0426 10/30/22 0531 10/31/22 0228  NA 138 138  --  137  --  138 137 137 136 137  K 6.1* 6.2*   < > 5.0   < > 4.8 5.2* 4.2 4.0 4.1  CL 105 104  --  102  --  101 98 102 100 104  CO2 24 22  --  25  --  24 27 25 25 24   GLUCOSE 112* 106*  --  102*  --  119* 103* 120* 105* 99  BUN 95* 92*  --  67*  --  79* 62* 65* 61* 53*  CREATININE 3.47* 3.50*  --  2.77*  --  3.12* 2.73* 2.75* 2.80* 2.57*  CALCIUM 8.9 8.7*  --  8.2*  --  8.5* 8.4* 8.6* 8.7* 8.7*  MG  --   --   --   --   --   --   --  2.2 2.2 2.1  PHOS 5.8* 6.4*  --  5.5*  --  6.0*  --   --   --   --    < > = values in this  interval not displayed.    Liver Function Tests: Recent Labs  Lab 10/25/22 0453 10/25/22 1258 10/26/22 0528 10/27/22 0403  ALBUMIN 2.4* 2.3* 2.2* 2.2*   No results for input(s): "LIPASE", "AMYLASE" in the last 168 hours. No results for input(s): "AMMONIA" in the last 168 hours.  CBC: Recent Labs  Lab 10/25/22 0453 10/27/22 0403 10/28/22 0402 10/29/22 0426 10/30/22 0531 10/31/22 0228  WBC 27.9* 23.0* 29.8* 25.1* 23.8* 21.4*  NEUTROABS 22.6* 17.8* 24.2*  --   --   --   HGB 10.4* 9.0* 9.8* 9.5* 9.5* 9.4*  HCT 32.3* 26.7* 29.8* 28.9* 28.9* 28.8*  MCV 91.5 89.3 92.3 90.6 92.6 92.0  PLT 692* 463* 355 351 350 320    Cardiac Enzymes: No results for input(s): "CKTOTAL", "CKMB", "CKMBINDEX", "TROPONINI" in the last 168 hours.  BNP: Invalid input(s): "POCBNP"  CBG: Recent Labs  Lab 10/25/22 0816 10/28/22 0810 10/28/22 1648 10/30/22 0832 10/31/22 0822  GLUCAP 92 89 111* 86 88  Microbiology: Results for orders placed or performed during the hospital encounter of 10/13/22  SARS Coronavirus 2 by RT PCR (hospital order, performed in [facility] hospital lab) *cepheid single result test* Anterior Nasal Swab     Status: None   Collection Time: 10/13/22  3:55 PM   Specimen: Anterior Nasal Swab  Result Value Ref Range Status   SARS Coronavirus 2 by RT PCR NEGATIVE NEGATIVE Final    Comment: (NOTE) SARS-CoV-2 target nucleic acids are NOT DETECTED.  The SARS-CoV-2 RNA is generally detectable in upper and lower respiratory specimens during the acute phase of infection. The lowest concentration of SARS-CoV-2 viral copies this assay can detect is 250 copies / mL. A negative result does not preclude SARS-CoV-2 infection and should not be used as the sole basis for treatment or other patient management decisions.  A negative result may occur with improper specimen collection / handling, submission of specimen other than nasopharyngeal swab, presence of viral mutation(s)  within the areas targeted by this assay, and inadequate number of viral copies (<250 copies / mL). A negative result must be combined with clinical observations, patient history, and epidemiological information.  Fact Sheet for Patients:   RoadLapTop.co.za  Fact Sheet for Healthcare Providers: http://kim-miller.com/  This test is not yet approved or  cleared by the [place] FDA and has been authorized for detection and/or diagnosis of SARS-CoV-2 by FDA under an Emergency Use Authorization (EUA).  This EUA will remain in effect (meaning this test can be used) for the duration of the COVID-19 declaration under Section 564(b)(1) of the Act, 21 U.S.C. section 360bbb-3(b)(1), unless the authorization is terminated or revoked sooner.  Performed at [facility], [address] [place], [place] [address]   MRSA Next Gen by PCR, Nasal     Status: None   Collection Time: 10/13/22  9:51 PM   Specimen: Nasal Mucosa; Nasal Swab  Result Value Ref Range Status   MRSA by PCR Next Gen NOT DETECTED NOT DETECTED Final    Comment: (NOTE) The GeneXpert MRSA Assay (FDA approved for NASAL specimens only), is one component of a comprehensive MRSA colonization surveillance program. It is not intended to diagnose MRSA infection nor to guide or monitor treatment for MRSA infections. Test performance is not FDA approved in patients less than 58 years old. Performed at [facility], [address] [place], [place] [address]   Urine Culture     Status: Abnormal   Collection Time: 10/14/22  7:32 AM   Specimen: Urine, Random  Result Value Ref Range Status   Specimen Description   Final    URINE, RANDOM Performed at [facility], [address] [place], [place] [address]    Special Requests   Final    NONE Reflexed from [id number] Performed at [facility], [address] [place], [place] [address]    Culture  >=100,000 COLONIES/mL KLEBSIELLA PNEUMONIAE (A)  Final   Report Status 10/16/2022 FINAL  Final   Organism ID, Bacteria KLEBSIELLA PNEUMONIAE (A)  Final      Susceptibility   Klebsiella pneumoniae - MIC*    AMPICILLIN >=32 RESISTANT Resistant     CEFAZOLIN <=4 SENSITIVE Sensitive     CEFEPIME <=0.12 SENSITIVE Sensitive     CEFTRIAXONE <=0.25 SENSITIVE Sensitive     CIPROFLOXACIN <=0.25 SENSITIVE Sensitive     GENTAMICIN <=1 SENSITIVE Sensitive     IMIPENEM <=0.25 SENSITIVE Sensitive     NITROFURANTOIN 128 RESISTANT Resistant  TRIMETH/SULFA <=20 SENSITIVE Sensitive     AMPICILLIN/SULBACTAM 8 SENSITIVE Sensitive     PIP/TAZO <=4 SENSITIVE Sensitive     * >=100,000 COLONIES/mL KLEBSIELLA PNEUMONIAE  Culture, blood (Routine X 2) w Reflex to ID Panel     Status: None   Collection Time: 10/15/22  1:29 AM   Specimen: Right Antecubital; Blood  Result Value Ref Range Status   Specimen Description RIGHT ANTECUBITAL  Final   Special Requests   Final    BOTTLES DRAWN AEROBIC AND ANAEROBIC Blood Culture adequate volume   Culture   Final    NO GROWTH 5 DAYS Performed at [facility], [address] [place], [place] [address]    Report Status 10/20/2022 FINAL  Final  Culture, blood (Routine X 2) w Reflex to ID Panel     Status: None   Collection Time: 10/15/22 11:41 AM   Specimen: BLOOD  Result Value Ref Range Status   Specimen Description BLOOD BLOOD RIGHT HAND  Final   Special Requests   Final    BOTTLES DRAWN AEROBIC ONLY Blood Culture results may not be optimal due to an inadequate volume of blood received in culture bottles   Culture   Final    NO GROWTH 5 DAYS Performed at [facility], [address], [place], [place] [address]    Report Status 10/20/2022 FINAL  Final  Body fluid culture w Gram Stain     Status: None   Collection Time: 10/17/22  2:02 PM   Specimen: Synovium; Body Fluid  Result Value Ref Range Status   Specimen Description   Final     SYNOVIAL Performed at [facility], [address] [place], [place] [address]    Special Requests   Final    Normal Performed at [facility], [address] [place], [place] [address]    Gram Stain   Final    FEW WBC PRESENT,BOTH PMN AND MONONUCLEAR NO ORGANISMS SEEN    Culture   Final    NO GROWTH 3 DAYS Performed at [facility], [address], [place], [place] [address]    Report Status 10/21/2022 FINAL  Final    Coagulation Studies: No results for input(s): "LABPROT", "INR" in the last 72 hours.  Urinalysis: No results for input(s): "COLORURINE", "LABSPEC", "PHURINE", "GLUCOSEU", "HGBUR", "BILIRUBINUR", "KETONESUR", "PROTEINUR", "UROBILINOGEN", "NITRITE", "LEUKOCYTESUR" in the last 72 hours.  Invalid input(s): "APPERANCEUR"    Imaging: No results found.   Medications:    sodium chloride     calcium gluconate     sodium chloride      (feeding supplement) PROSource Plus  30 mL Oral BID BM   vitamin C  500 mg Oral BID   Chlorhexidine Gluconate Cloth  6 each Topical Q0600   cyanocobalamin  1,000 mcg Oral Daily   insulin aspart  10 Units Intravenous Once   And   dextrose  1 ampule Intravenous Once   feeding supplement (NEPRO CARB STEADY)  237 mL Oral TID BM   folic acid  1 mg Oral Daily   heparin injection (subcutaneous)  5,000 Units Subcutaneous Q8H   multivitamin  1 tablet Oral QHS   nicotine  14 mg Transdermal Daily   thiamine  100 mg Oral Daily   Or   thiamine  100 mg Intravenous Daily   Vitamin D (Ergocalciferol)  50,000 Units Oral Q7 days   acetaminophen, chlorproMAZINE, heparin, melatonin, ondansetron **OR** ondansetron (ZOFRAN) IV, mouth rinse, polyethylene glycol, prochlorperazine, traMADol,  traZODone  Assessment/ Plan:  Mr. Joshua Mora is a 72 y.o.  male with tobacco use, alcohol abuse, osteoarthritis status post left knee replacement and hyperlipidemia, who is admitted on 10/13/2022 for AKI (acute kidney injury)  [facility]) [N17.9]  Acute Kidney Injury on chronic kidney disease stage IIIB: Baseline creatinine of 1.74, GFR of 41 on 08/18/22. Suspect patient's acute kidney injury secondary to NSAID induced nephropathy and concurrent sepsis. No history of diabetes or hypertension per patient. Renal ultrasound with no obstruction. No IV contrast. -Underwent first hemodialysis treatment on 10/15/2022, first series lasted until 10/21/22.  Then resumed hemodialysis on 9/10 and 9/12.  - Continue to have adequate urine output - Creatinine improved - Will continue to hold dialysis and monitor for renal recovery.   2. Urinary tract infection with sepsis: Klebsiella species.  - completed antibiotics course  3.  Anemia with renal failure: hemoglobin 9.4. Has received EPO  4. 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

## 2022-10-31 NOTE — Evaluation (Signed)
Occupational Therapy Re-Evaluation Patient Details Name: Joshua Mora MRN: [id number] DOB: [birth year] Today's Date: 10/31/2022   History of Present Illness Pt is a 72 year old presenting to the ED with progressive weakness and frequent falls; admitted with AKI, leukocytosis  PMH significant for osteoarthritis s/p left knee replacement. Pt found to have L fibular head fx. Now with multiple L ankle ligament injuries for which CAM boot is recommended with mobility   Clinical Impression   Upon entering the room, pt supine in bed with daughter present in room and agreeable to co-treatment session with PT and OT. Pt needing +2 max A for bed mobility and total A to doff B prevalon boots and to don L CAM boot and R slip on shoe from home. Pt standing x 3 reps from elevated surface with mod A of 2 and cuing for upright posture. Pt having BM while standing and needed total A for hygiene. Pt returning to supine with +2 assistance . Pt making slow progress towards goals but continues to benefit from OT intervention.       If plan is discharge home, recommend the following: A lot of help with walking and/or transfers;A lot of help with bathing/dressing/bathroom;Supervision due to cognitive status;Assist for transportation;Direct supervision/assist for medications management;Direct supervision/assist for financial management;Help with stairs or ramp for entrance       Equipment Recommendations  Other (comment) (defer to next venue of care)       Precautions / Restrictions Precautions Precautions: Fall Required Braces or Orthoses: Other Brace Other Brace: CAM walker off while in bed, prevalons in bed Restrictions Weight Bearing Restrictions: Yes LLE Weight Bearing: Weight bearing as tolerated Other Position/Activity Restrictions: with CAM walker in place      Mobility Bed Mobility Overal bed mobility: Needs Assistance Bed Mobility: Supine to Sit, Sit to Supine     Supine to sit: +2 for  physical assistance, Max assist Sit to supine: +2 for physical assistance, Max assist   General bed mobility comments: CAM walker boot placed on LLE prior to mobilizing. significant assistance needed bed mobility today    Transfers Overall transfer level: Needs assistance Equipment used: Rolling walker (2 wheels) Transfers: Sit to/from Stand Sit to Stand: Mod assist, +2 physical assistance           General transfer comment: Pt able to perform STS x3 with mod A each time, able to stand for ~2 min for first 2 trials, able to stand for slightly less time during final trial.      Balance Overall balance assessment: Needs assistance Sitting-balance support: Bilateral upper extremity supported, Feet unsupported Sitting balance-Leahy Scale: Fair   Postural control: Posterior lean Standing balance support: Bilateral upper extremity supported, During functional activity Standing balance-Leahy Scale: Poor                             ADL either performed or assessed with clinical judgement   ADL Overall ADL's : Needs assistance/impaired                                       General ADL Comments: total A to don/doff prevalon boots and CAM boots. Pt having BM in standing and OT provided total A for hygiene     Vision Patient Visual Report: No change from baseline  Pertinent Vitals/Pain Pain Assessment Pain Assessment: 0-10 Pain Score: 10-Worst pain ever Pain Location: L ankle, lower part of L knee Pain Descriptors / Indicators: Discomfort, Grimacing, Guarding, Aching, Moaning Pain Intervention(s): Monitored during session, Premedicated before session, Repositioned     Extremity/Trunk Assessment Upper Extremity Assessment Upper Extremity Assessment: Generalized weakness   Lower Extremity Assessment Lower Extremity Assessment: Generalized weakness       Communication Communication Communication: No apparent difficulties    Cognition Arousal: Alert Behavior During Therapy: WFL for tasks assessed/performed Overall Cognitive Status: Within Functional Limits for tasks assessed                                                  Home Living Family/patient expects to be discharged to:: Private residence Living Arrangements: Children;Other relatives Available Help at Discharge: Family Type of Home: House Home Access: Ramped entrance Entrance Stairs-Number of Steps: through garage   Home Layout: One level     Bathroom Shower/Tub: Producer, television/film/video: Standard                 OT Goals(Current goals can be found in the care plan section) Acute Rehab OT Goals Time For Goal Achievement: 11/28/22 Potential to Achieve Goals: Fair ADL Goals Pt Will Perform Toileting - Clothing Manipulation and hygiene: with min assist;sitting/lateral leans  OT Frequency: Min 1X/week    Co-evaluation   Reason for Co-Treatment: Complexity of the patient's impairments (multi-system involvement);To address functional/ADL transfers PT goals addressed during session: Mobility/safety with mobility;Proper use of DME        AM-PAC OT "6 Clicks" Daily Activity     Outcome Measure Help from another person eating meals?: A Little Help from another person taking care of personal grooming?: A Little Help from another person toileting, which includes using toliet, bedpan, or urinal?: Total Help from another person bathing (including washing, rinsing, drying)?: Total Help from another person to put on and taking off regular upper body clothing?: A Lot Help from another person to put on and taking off regular lower body clothing?: A Lot 6 Click Score: 12   End of Session Nurse Communication: Mobility status  Activity Tolerance: Patient limited by pain;Patient tolerated treatment well Patient left: in bed;with call bell/phone within reach;with family/visitor present;with bed alarm set  OT Visit  Diagnosis: Other abnormalities of gait and mobility (R26.89);Muscle weakness (generalized) (M62.81)                Time: 7829-5621 OT Time Calculation (min): 32 min Charges:  OT General Charges $OT Visit: 1 Visit OT Evaluation $OT Re-eval: 1 Re-eval OT Treatments $Therapeutic Activity: 8-22 mins Jackquline Denmark, MS, OTR/L , CBIS ascom [phone]  10/31/22, 4:54 PM

## 2022-10-31 NOTE — Progress Notes (Signed)
   10/31/22 1400  Spiritual Encounters  Type of Visit Follow up  Care provided to: Pt and family  Referral source Family;Patient request  Reason for visit Advance directives  OnCall Visit Yes  Spiritual Framework  Presenting Themes Caregiving needs  Patient Stress Factors Health changes  Family Stress Factors Health changes  Interventions  Spiritual Care Interventions Made Decision-making support/facilitation  Spiritual Care Plan  Spiritual Care Issues Still Outstanding No further spiritual care needs at this time (see row info)   Chaplain received a page for an Advanced Directive. Chaplain met with the patient and his daughter to go over the packet. Chaplain got witness and notary to finish the paperwork. Chaplain sent in the documents and gave the family the original copy.

## 2022-10-31 NOTE — Progress Notes (Signed)
[facility]  Telephone:(336) [phone] Fax:(336) [phone]  ID: Ala Dach OB: Feb 02, 1951  MR#: [id number]  CZY#:[id number]  Patient Care Team: Sandrea Hughs, NP as PCP - General (Nurse Practitioner)   ASSESSMENT AND PLAN:   Joshua Mora is a 72 y.o. male with pmh of CKD stage III, alcohol use presented to ED follow-up with progressive weakness and falls.Marland Kitchen  He was found to have AKI on CKD started on dialysis.Hospitalization complicated by fevers and Klebsiella UTI. Hematology consulted for persistent leucocytosis.    # Leucocytosis  - On presentation 10/13/2022 WBC 46.7 predominantly neutrophilia.  More than 20% bands. WBC on 10/16/22 51.4, ANC 39.3, Monocytes 3.0. mild left shift with 1-5% metamyelocytes and occasional myelocytes. No blasts noted. Monocytes fluctuating between normal and 3.0.    -Flow cytometry was negative.  BCR-ABL by FISH negative  -Patient had persistent leukocytosis in 20s despite resolution of infection and completion of antibiotic.   - S/p bone marrow biopsy on 10/25/2022 which showed reactive changes.  Cytogenetics and myeloid NGS is pending.  Patient will be informed if the testing is any significant.  Will arrange outpatient visit if needed.  Results were discussed with the patient at bedside.    #AKI on CKD - likely secondary to NSAIDs, and rhabdomyolysis  -Kappa 96.2, lambda 58, ratio 1.65.  SPEP/IFE no M protein.   Hematology will sign off.  Patient expressed understanding and was in agreement with this plan. He also understands that He can call clinic at any time with any questions, concerns, or complaints.   I spent a total of 60 minutes reviewing chart data, face-to-face evaluation with the patient, counseling and coordination of care as detailed above.  HPI: Joshua Mora is a 72 y.o. male with pmh of CKD stage III, alcohol use presented to ED follow-up with progressive weakness and falls.Marland Kitchen  He was found to have  AKI on CKD started on dialysis.  Renal ultrasound negative for obstruction.  Likely secondary from NSAID use and rhabdomyolysis.  Hospitalization complicated by fevers and Klebsiella UTI.  Blood cultures negative.  Patient on ceftriaxone.    On presentation 10/13/2022 WBC 46.7 predominantly neutrophilia.  More than 20% bands. WBC on 10/16/22 51.4, ANC 39.3, Monocytes 3.0. mild left shift with 1-5% metamyelocytes and occasional myelocytes. No blasts noted. Moncocytes fluctuating between normal and 3.0.    CBC with diff from 08/18/2022 was normal, WBC 10.1, Hb 13 and normal plts   Also found to have elevated D-dimer.  Venous Dopplers negative for DVT.  Negative VQ scan.  Was briefly treated with heparin drip which is discontinued now.   Hematology consulted for persistent leukocytosis.   REVIEW OF SYSTEMS:   ROS  As per HPI. Otherwise, a complete review of systems is negative.  PAST MEDICAL HISTORY: Past Medical History:  Diagnosis Date   Arthritis    Chest pain     PAST SURGICAL HISTORY: Past Surgical History:  Procedure Laterality Date   COLONOSCOPY WITH PROPOFOL N/A 09/12/2016   Procedure: COLONOSCOPY WITH PROPOFOL;  Surgeon: Scot Jun, MD;  Location: [facility] ENDOSCOPY;  Service: Endoscopy;  Laterality: N/A;   COLONOSCOPY WITH PROPOFOL N/A 04/02/2020   Procedure: COLONOSCOPY WITH PROPOFOL;  Surgeon: Pasty Spillers, MD;  Location: [facility] ENDOSCOPY;  Service: Endoscopy;  Laterality: N/A;   DIALYSIS/PERMA CATHETER INSERTION N/A 10/28/2022   Procedure: DIALYSIS/PERMA CATHETER INSERTION;  Surgeon: Renford Dills, MD;  Location: [facility] INVASIVE CV LAB;  Service: Cardiovascular;  Laterality: N/A;   IR  [facility]  Telephone:(336) [phone] Fax:(336) [phone]  ID: Ala Dach OB: Feb 02, 1951  MR#: [id number]  CZY#:[id number]  Patient Care Team: Sandrea Hughs, NP as PCP - General (Nurse Practitioner)   ASSESSMENT AND PLAN:   Joshua Mora is a 72 y.o. male with pmh of CKD stage III, alcohol use presented to ED follow-up with progressive weakness and falls.Marland Kitchen  He was found to have AKI on CKD started on dialysis.Hospitalization complicated by fevers and Klebsiella UTI. Hematology consulted for persistent leucocytosis.    # Leucocytosis  - On presentation 10/13/2022 WBC 46.7 predominantly neutrophilia.  More than 20% bands. WBC on 10/16/22 51.4, ANC 39.3, Monocytes 3.0. mild left shift with 1-5% metamyelocytes and occasional myelocytes. No blasts noted. Monocytes fluctuating between normal and 3.0.    -Flow cytometry was negative.  BCR-ABL by FISH negative  -Patient had persistent leukocytosis in 20s despite resolution of infection and completion of antibiotic.   - S/p bone marrow biopsy on 10/25/2022 which showed reactive changes.  Cytogenetics and myeloid NGS is pending.  Patient will be informed if the testing is any significant.  Will arrange outpatient visit if needed.  Results were discussed with the patient at bedside.    #AKI on CKD - likely secondary to NSAIDs, and rhabdomyolysis  -Kappa 96.2, lambda 58, ratio 1.65.  SPEP/IFE no M protein.   Hematology will sign off.  Patient expressed understanding and was in agreement with this plan. He also understands that He can call clinic at any time with any questions, concerns, or complaints.   I spent a total of 60 minutes reviewing chart data, face-to-face evaluation with the patient, counseling and coordination of care as detailed above.  HPI: Joshua Mora is a 72 y.o. male with pmh of CKD stage III, alcohol use presented to ED follow-up with progressive weakness and falls.Marland Kitchen  He was found to have  AKI on CKD started on dialysis.  Renal ultrasound negative for obstruction.  Likely secondary from NSAID use and rhabdomyolysis.  Hospitalization complicated by fevers and Klebsiella UTI.  Blood cultures negative.  Patient on ceftriaxone.    On presentation 10/13/2022 WBC 46.7 predominantly neutrophilia.  More than 20% bands. WBC on 10/16/22 51.4, ANC 39.3, Monocytes 3.0. mild left shift with 1-5% metamyelocytes and occasional myelocytes. No blasts noted. Moncocytes fluctuating between normal and 3.0.    CBC with diff from 08/18/2022 was normal, WBC 10.1, Hb 13 and normal plts   Also found to have elevated D-dimer.  Venous Dopplers negative for DVT.  Negative VQ scan.  Was briefly treated with heparin drip which is discontinued now.   Hematology consulted for persistent leukocytosis.   REVIEW OF SYSTEMS:   ROS  As per HPI. Otherwise, a complete review of systems is negative.  PAST MEDICAL HISTORY: Past Medical History:  Diagnosis Date   Arthritis    Chest pain     PAST SURGICAL HISTORY: Past Surgical History:  Procedure Laterality Date   COLONOSCOPY WITH PROPOFOL N/A 09/12/2016   Procedure: COLONOSCOPY WITH PROPOFOL;  Surgeon: Scot Jun, MD;  Location: [facility] ENDOSCOPY;  Service: Endoscopy;  Laterality: N/A;   COLONOSCOPY WITH PROPOFOL N/A 04/02/2020   Procedure: COLONOSCOPY WITH PROPOFOL;  Surgeon: Pasty Spillers, MD;  Location: [facility] ENDOSCOPY;  Service: Endoscopy;  Laterality: N/A;   DIALYSIS/PERMA CATHETER INSERTION N/A 10/28/2022   Procedure: DIALYSIS/PERMA CATHETER INSERTION;  Surgeon: Renford Dills, MD;  Location: [facility] INVASIVE CV LAB;  Service: Cardiovascular;  Laterality: N/A;   IR  [facility]  Telephone:(336) [phone] Fax:(336) [phone]  ID: Ala Dach OB: Feb 02, 1951  MR#: [id number]  CZY#:[id number]  Patient Care Team: Sandrea Hughs, NP as PCP - General (Nurse Practitioner)   ASSESSMENT AND PLAN:   Joshua Mora is a 72 y.o. male with pmh of CKD stage III, alcohol use presented to ED follow-up with progressive weakness and falls.Marland Kitchen  He was found to have AKI on CKD started on dialysis.Hospitalization complicated by fevers and Klebsiella UTI. Hematology consulted for persistent leucocytosis.    # Leucocytosis  - On presentation 10/13/2022 WBC 46.7 predominantly neutrophilia.  More than 20% bands. WBC on 10/16/22 51.4, ANC 39.3, Monocytes 3.0. mild left shift with 1-5% metamyelocytes and occasional myelocytes. No blasts noted. Monocytes fluctuating between normal and 3.0.    -Flow cytometry was negative.  BCR-ABL by FISH negative  -Patient had persistent leukocytosis in 20s despite resolution of infection and completion of antibiotic.   - S/p bone marrow biopsy on 10/25/2022 which showed reactive changes.  Cytogenetics and myeloid NGS is pending.  Patient will be informed if the testing is any significant.  Will arrange outpatient visit if needed.  Results were discussed with the patient at bedside.    #AKI on CKD - likely secondary to NSAIDs, and rhabdomyolysis  -Kappa 96.2, lambda 58, ratio 1.65.  SPEP/IFE no M protein.   Hematology will sign off.  Patient expressed understanding and was in agreement with this plan. He also understands that He can call clinic at any time with any questions, concerns, or complaints.   I spent a total of 60 minutes reviewing chart data, face-to-face evaluation with the patient, counseling and coordination of care as detailed above.  HPI: Joshua Mora is a 72 y.o. male with pmh of CKD stage III, alcohol use presented to ED follow-up with progressive weakness and falls.Marland Kitchen  He was found to have  AKI on CKD started on dialysis.  Renal ultrasound negative for obstruction.  Likely secondary from NSAID use and rhabdomyolysis.  Hospitalization complicated by fevers and Klebsiella UTI.  Blood cultures negative.  Patient on ceftriaxone.    On presentation 10/13/2022 WBC 46.7 predominantly neutrophilia.  More than 20% bands. WBC on 10/16/22 51.4, ANC 39.3, Monocytes 3.0. mild left shift with 1-5% metamyelocytes and occasional myelocytes. No blasts noted. Moncocytes fluctuating between normal and 3.0.    CBC with diff from 08/18/2022 was normal, WBC 10.1, Hb 13 and normal plts   Also found to have elevated D-dimer.  Venous Dopplers negative for DVT.  Negative VQ scan.  Was briefly treated with heparin drip which is discontinued now.   Hematology consulted for persistent leukocytosis.   REVIEW OF SYSTEMS:   ROS  As per HPI. Otherwise, a complete review of systems is negative.  PAST MEDICAL HISTORY: Past Medical History:  Diagnosis Date   Arthritis    Chest pain     PAST SURGICAL HISTORY: Past Surgical History:  Procedure Laterality Date   COLONOSCOPY WITH PROPOFOL N/A 09/12/2016   Procedure: COLONOSCOPY WITH PROPOFOL;  Surgeon: Scot Jun, MD;  Location: [facility] ENDOSCOPY;  Service: Endoscopy;  Laterality: N/A;   COLONOSCOPY WITH PROPOFOL N/A 04/02/2020   Procedure: COLONOSCOPY WITH PROPOFOL;  Surgeon: Pasty Spillers, MD;  Location: [facility] ENDOSCOPY;  Service: Endoscopy;  Laterality: N/A;   DIALYSIS/PERMA CATHETER INSERTION N/A 10/28/2022   Procedure: DIALYSIS/PERMA CATHETER INSERTION;  Surgeon: Renford Dills, MD;  Location: [facility] INVASIVE CV LAB;  Service: Cardiovascular;  Laterality: N/A;   IR  There is no acute osseous abnormality. IMPRESSION: Unchanged mild pulmonary edema. Electronically Signed   By: Lesia Hausen M.D.   On: 10/14/2022 16:02   ECHOCARDIOGRAM COMPLETE  Result Date: 10/14/2022    ECHOCARDIOGRAM REPORT   Patient Name:   ELLERY WIDER Date of Exam: 10/14/2022 Medical Rec #:  [id number]            Height:       69.0 in Accession #:    [id number]           Weight:       185.6 lb Date of Birth:  1950/10/30            BSA:          2.002 m Patient Age:    72 years             BP:           120/61 mmHg Patient Gender: M                    HR:           107 bpm. Exam Location:  [facility] Procedure: 2D Echo, Cardiac Doppler, Color Doppler and Intracardiac            Opacification Agent Indications:     Elevated Troponin  History:         Patient has no prior history of Echocardiogram examinations.                  Signs/Symptoms:Chest Pain; Risk Factors:Diabetes, Dyslipidemia                  and Current Smoker.  Sonographer:     Mikki Harbor Referring Phys:  [id number] Oliver Pila HALL Diagnosing Phys: Julien Nordmann MD  Sonographer Comments: Technically difficult study due to poor echo windows. Image acquisition challenging due to respiratory motion. IMPRESSIONS  1. Left ventricular ejection fraction, by estimation, is 60 to 65%. The left ventricle has normal function. The left ventricle has no regional wall motion abnormalities. There is mild left ventricular hypertrophy. Left  ventricular diastolic parameters are consistent with Grade I diastolic dysfunction (impaired relaxation).  2. Right ventricular systolic function is normal. The right ventricular size is normal. Tricuspid regurgitation signal is inadequate for assessing PA pressure.  3. The mitral valve is normal in structure. No evidence of mitral valve regurgitation. No evidence of mitral stenosis.  4. The aortic valve has an indeterminant number of cusps. Aortic valve regurgitation is mild. Aortic valve sclerosis is present, with no evidence of aortic valve stenosis.  5. The inferior vena cava is normal in size with greater than 50% respiratory variability, suggesting right atrial pressure of 3 mmHg. FINDINGS  Left Ventricle: Left ventricular ejection fraction, by estimation, is 60 to 65%. The left ventricle has normal function. The left ventricle has no regional wall motion abnormalities. Definity contrast agent was given IV to delineate the left ventricular  endocardial borders. The left ventricular internal cavity size was normal in size. There is mild left ventricular hypertrophy. Left ventricular diastolic parameters are consistent with Grade I diastolic dysfunction (impaired relaxation). Right Ventricle: The right ventricular size is normal. No increase in right ventricular wall thickness. Right ventricular systolic function is normal. Tricuspid regurgitation signal is inadequate for assessing PA pressure. Left Atrium: Left atrial size was normal in size. Right Atrium: Right atrial size was normal in size. Pericardium: There is no evidence of pericardial effusion. Mitral Valve: The  [facility]  Telephone:(336) [phone] Fax:(336) [phone]  ID: Ala Dach OB: Feb 02, 1951  MR#: [id number]  CZY#:[id number]  Patient Care Team: Sandrea Hughs, NP as PCP - General (Nurse Practitioner)   ASSESSMENT AND PLAN:   Joshua Mora is a 72 y.o. male with pmh of CKD stage III, alcohol use presented to ED follow-up with progressive weakness and falls.Marland Kitchen  He was found to have AKI on CKD started on dialysis.Hospitalization complicated by fevers and Klebsiella UTI. Hematology consulted for persistent leucocytosis.    # Leucocytosis  - On presentation 10/13/2022 WBC 46.7 predominantly neutrophilia.  More than 20% bands. WBC on 10/16/22 51.4, ANC 39.3, Monocytes 3.0. mild left shift with 1-5% metamyelocytes and occasional myelocytes. No blasts noted. Monocytes fluctuating between normal and 3.0.    -Flow cytometry was negative.  BCR-ABL by FISH negative  -Patient had persistent leukocytosis in 20s despite resolution of infection and completion of antibiotic.   - S/p bone marrow biopsy on 10/25/2022 which showed reactive changes.  Cytogenetics and myeloid NGS is pending.  Patient will be informed if the testing is any significant.  Will arrange outpatient visit if needed.  Results were discussed with the patient at bedside.    #AKI on CKD - likely secondary to NSAIDs, and rhabdomyolysis  -Kappa 96.2, lambda 58, ratio 1.65.  SPEP/IFE no M protein.   Hematology will sign off.  Patient expressed understanding and was in agreement with this plan. He also understands that He can call clinic at any time with any questions, concerns, or complaints.   I spent a total of 60 minutes reviewing chart data, face-to-face evaluation with the patient, counseling and coordination of care as detailed above.  HPI: Joshua Mora is a 72 y.o. male with pmh of CKD stage III, alcohol use presented to ED follow-up with progressive weakness and falls.Marland Kitchen  He was found to have  AKI on CKD started on dialysis.  Renal ultrasound negative for obstruction.  Likely secondary from NSAID use and rhabdomyolysis.  Hospitalization complicated by fevers and Klebsiella UTI.  Blood cultures negative.  Patient on ceftriaxone.    On presentation 10/13/2022 WBC 46.7 predominantly neutrophilia.  More than 20% bands. WBC on 10/16/22 51.4, ANC 39.3, Monocytes 3.0. mild left shift with 1-5% metamyelocytes and occasional myelocytes. No blasts noted. Moncocytes fluctuating between normal and 3.0.    CBC with diff from 08/18/2022 was normal, WBC 10.1, Hb 13 and normal plts   Also found to have elevated D-dimer.  Venous Dopplers negative for DVT.  Negative VQ scan.  Was briefly treated with heparin drip which is discontinued now.   Hematology consulted for persistent leukocytosis.   REVIEW OF SYSTEMS:   ROS  As per HPI. Otherwise, a complete review of systems is negative.  PAST MEDICAL HISTORY: Past Medical History:  Diagnosis Date   Arthritis    Chest pain     PAST SURGICAL HISTORY: Past Surgical History:  Procedure Laterality Date   COLONOSCOPY WITH PROPOFOL N/A 09/12/2016   Procedure: COLONOSCOPY WITH PROPOFOL;  Surgeon: Scot Jun, MD;  Location: [facility] ENDOSCOPY;  Service: Endoscopy;  Laterality: N/A;   COLONOSCOPY WITH PROPOFOL N/A 04/02/2020   Procedure: COLONOSCOPY WITH PROPOFOL;  Surgeon: Pasty Spillers, MD;  Location: [facility] ENDOSCOPY;  Service: Endoscopy;  Laterality: N/A;   DIALYSIS/PERMA CATHETER INSERTION N/A 10/28/2022   Procedure: DIALYSIS/PERMA CATHETER INSERTION;  Surgeon: Renford Dills, MD;  Location: [facility] INVASIVE CV LAB;  Service: Cardiovascular;  Laterality: N/A;   IR  There is no acute osseous abnormality. IMPRESSION: Unchanged mild pulmonary edema. Electronically Signed   By: Lesia Hausen M.D.   On: 10/14/2022 16:02   ECHOCARDIOGRAM COMPLETE  Result Date: 10/14/2022    ECHOCARDIOGRAM REPORT   Patient Name:   ELLERY WIDER Date of Exam: 10/14/2022 Medical Rec #:  [id number]            Height:       69.0 in Accession #:    [id number]           Weight:       185.6 lb Date of Birth:  1950/10/30            BSA:          2.002 m Patient Age:    72 years             BP:           120/61 mmHg Patient Gender: M                    HR:           107 bpm. Exam Location:  [facility] Procedure: 2D Echo, Cardiac Doppler, Color Doppler and Intracardiac            Opacification Agent Indications:     Elevated Troponin  History:         Patient has no prior history of Echocardiogram examinations.                  Signs/Symptoms:Chest Pain; Risk Factors:Diabetes, Dyslipidemia                  and Current Smoker.  Sonographer:     Mikki Harbor Referring Phys:  [id number] Oliver Pila HALL Diagnosing Phys: Julien Nordmann MD  Sonographer Comments: Technically difficult study due to poor echo windows. Image acquisition challenging due to respiratory motion. IMPRESSIONS  1. Left ventricular ejection fraction, by estimation, is 60 to 65%. The left ventricle has normal function. The left ventricle has no regional wall motion abnormalities. There is mild left ventricular hypertrophy. Left  ventricular diastolic parameters are consistent with Grade I diastolic dysfunction (impaired relaxation).  2. Right ventricular systolic function is normal. The right ventricular size is normal. Tricuspid regurgitation signal is inadequate for assessing PA pressure.  3. The mitral valve is normal in structure. No evidence of mitral valve regurgitation. No evidence of mitral stenosis.  4. The aortic valve has an indeterminant number of cusps. Aortic valve regurgitation is mild. Aortic valve sclerosis is present, with no evidence of aortic valve stenosis.  5. The inferior vena cava is normal in size with greater than 50% respiratory variability, suggesting right atrial pressure of 3 mmHg. FINDINGS  Left Ventricle: Left ventricular ejection fraction, by estimation, is 60 to 65%. The left ventricle has normal function. The left ventricle has no regional wall motion abnormalities. Definity contrast agent was given IV to delineate the left ventricular  endocardial borders. The left ventricular internal cavity size was normal in size. There is mild left ventricular hypertrophy. Left ventricular diastolic parameters are consistent with Grade I diastolic dysfunction (impaired relaxation). Right Ventricle: The right ventricular size is normal. No increase in right ventricular wall thickness. Right ventricular systolic function is normal. Tricuspid regurgitation signal is inadequate for assessing PA pressure. Left Atrium: Left atrial size was normal in size. Right Atrium: Right atrial size was normal in size. Pericardium: There is no evidence of pericardial effusion. Mitral Valve: The  [facility]  Telephone:(336) [phone] Fax:(336) [phone]  ID: Ala Dach OB: Feb 02, 1951  MR#: [id number]  CZY#:[id number]  Patient Care Team: Sandrea Hughs, NP as PCP - General (Nurse Practitioner)   ASSESSMENT AND PLAN:   Joshua Mora is a 72 y.o. male with pmh of CKD stage III, alcohol use presented to ED follow-up with progressive weakness and falls.Marland Kitchen  He was found to have AKI on CKD started on dialysis.Hospitalization complicated by fevers and Klebsiella UTI. Hematology consulted for persistent leucocytosis.    # Leucocytosis  - On presentation 10/13/2022 WBC 46.7 predominantly neutrophilia.  More than 20% bands. WBC on 10/16/22 51.4, ANC 39.3, Monocytes 3.0. mild left shift with 1-5% metamyelocytes and occasional myelocytes. No blasts noted. Monocytes fluctuating between normal and 3.0.    -Flow cytometry was negative.  BCR-ABL by FISH negative  -Patient had persistent leukocytosis in 20s despite resolution of infection and completion of antibiotic.   - S/p bone marrow biopsy on 10/25/2022 which showed reactive changes.  Cytogenetics and myeloid NGS is pending.  Patient will be informed if the testing is any significant.  Will arrange outpatient visit if needed.  Results were discussed with the patient at bedside.    #AKI on CKD - likely secondary to NSAIDs, and rhabdomyolysis  -Kappa 96.2, lambda 58, ratio 1.65.  SPEP/IFE no M protein.   Hematology will sign off.  Patient expressed understanding and was in agreement with this plan. He also understands that He can call clinic at any time with any questions, concerns, or complaints.   I spent a total of 60 minutes reviewing chart data, face-to-face evaluation with the patient, counseling and coordination of care as detailed above.  HPI: Joshua Mora is a 72 y.o. male with pmh of CKD stage III, alcohol use presented to ED follow-up with progressive weakness and falls.Marland Kitchen  He was found to have  AKI on CKD started on dialysis.  Renal ultrasound negative for obstruction.  Likely secondary from NSAID use and rhabdomyolysis.  Hospitalization complicated by fevers and Klebsiella UTI.  Blood cultures negative.  Patient on ceftriaxone.    On presentation 10/13/2022 WBC 46.7 predominantly neutrophilia.  More than 20% bands. WBC on 10/16/22 51.4, ANC 39.3, Monocytes 3.0. mild left shift with 1-5% metamyelocytes and occasional myelocytes. No blasts noted. Moncocytes fluctuating between normal and 3.0.    CBC with diff from 08/18/2022 was normal, WBC 10.1, Hb 13 and normal plts   Also found to have elevated D-dimer.  Venous Dopplers negative for DVT.  Negative VQ scan.  Was briefly treated with heparin drip which is discontinued now.   Hematology consulted for persistent leukocytosis.   REVIEW OF SYSTEMS:   ROS  As per HPI. Otherwise, a complete review of systems is negative.  PAST MEDICAL HISTORY: Past Medical History:  Diagnosis Date   Arthritis    Chest pain     PAST SURGICAL HISTORY: Past Surgical History:  Procedure Laterality Date   COLONOSCOPY WITH PROPOFOL N/A 09/12/2016   Procedure: COLONOSCOPY WITH PROPOFOL;  Surgeon: Scot Jun, MD;  Location: [facility] ENDOSCOPY;  Service: Endoscopy;  Laterality: N/A;   COLONOSCOPY WITH PROPOFOL N/A 04/02/2020   Procedure: COLONOSCOPY WITH PROPOFOL;  Surgeon: Pasty Spillers, MD;  Location: [facility] ENDOSCOPY;  Service: Endoscopy;  Laterality: N/A;   DIALYSIS/PERMA CATHETER INSERTION N/A 10/28/2022   Procedure: DIALYSIS/PERMA CATHETER INSERTION;  Surgeon: Renford Dills, MD;  Location: [facility] INVASIVE CV LAB;  Service: Cardiovascular;  Laterality: N/A;   IR  There is no acute osseous abnormality. IMPRESSION: Unchanged mild pulmonary edema. Electronically Signed   By: Lesia Hausen M.D.   On: 10/14/2022 16:02   ECHOCARDIOGRAM COMPLETE  Result Date: 10/14/2022    ECHOCARDIOGRAM REPORT   Patient Name:   ELLERY WIDER Date of Exam: 10/14/2022 Medical Rec #:  [id number]            Height:       69.0 in Accession #:    [id number]           Weight:       185.6 lb Date of Birth:  1950/10/30            BSA:          2.002 m Patient Age:    72 years             BP:           120/61 mmHg Patient Gender: M                    HR:           107 bpm. Exam Location:  [facility] Procedure: 2D Echo, Cardiac Doppler, Color Doppler and Intracardiac            Opacification Agent Indications:     Elevated Troponin  History:         Patient has no prior history of Echocardiogram examinations.                  Signs/Symptoms:Chest Pain; Risk Factors:Diabetes, Dyslipidemia                  and Current Smoker.  Sonographer:     Mikki Harbor Referring Phys:  [id number] Oliver Pila HALL Diagnosing Phys: Julien Nordmann MD  Sonographer Comments: Technically difficult study due to poor echo windows. Image acquisition challenging due to respiratory motion. IMPRESSIONS  1. Left ventricular ejection fraction, by estimation, is 60 to 65%. The left ventricle has normal function. The left ventricle has no regional wall motion abnormalities. There is mild left ventricular hypertrophy. Left  ventricular diastolic parameters are consistent with Grade I diastolic dysfunction (impaired relaxation).  2. Right ventricular systolic function is normal. The right ventricular size is normal. Tricuspid regurgitation signal is inadequate for assessing PA pressure.  3. The mitral valve is normal in structure. No evidence of mitral valve regurgitation. No evidence of mitral stenosis.  4. The aortic valve has an indeterminant number of cusps. Aortic valve regurgitation is mild. Aortic valve sclerosis is present, with no evidence of aortic valve stenosis.  5. The inferior vena cava is normal in size with greater than 50% respiratory variability, suggesting right atrial pressure of 3 mmHg. FINDINGS  Left Ventricle: Left ventricular ejection fraction, by estimation, is 60 to 65%. The left ventricle has normal function. The left ventricle has no regional wall motion abnormalities. Definity contrast agent was given IV to delineate the left ventricular  endocardial borders. The left ventricular internal cavity size was normal in size. There is mild left ventricular hypertrophy. Left ventricular diastolic parameters are consistent with Grade I diastolic dysfunction (impaired relaxation). Right Ventricle: The right ventricular size is normal. No increase in right ventricular wall thickness. Right ventricular systolic function is normal. Tricuspid regurgitation signal is inadequate for assessing PA pressure. Left Atrium: Left atrial size was normal in size. Right Atrium: Right atrial size was normal in size. Pericardium: There is no evidence of pericardial effusion. Mitral Valve: The  [facility]  [phone] [phone]  ID: Ala Dach OB: Feb 02, 1951  MR#: [id number]  CZY#:[id number]  Patient Care Team: Sandrea Hughs, NP as PCP - General (Nurse Practitioner)   ASSESSMENT AND PLAN:   Joshua Mora is a 72 y.o. male with pmh of CKD stage III, alcohol use presented to ED follow-up with progressive weakness and falls.Marland Kitchen  He was found to have AKI on CKD started on dialysis.Hospitalization complicated by fevers and Klebsiella UTI. Hematology consulted for persistent leucocytosis.    # Leucocytosis  - On presentation 10/13/2022 WBC 46.7 predominantly neutrophilia.  More than 20% bands. WBC on 10/16/22 51.4, ANC 39.3, Monocytes 3.0. mild left shift with 1-5% metamyelocytes and occasional myelocytes. No blasts noted. Monocytes fluctuating between normal and 3.0.    -Flow cytometry was negative.  BCR-ABL by FISH negative  -Patient had persistent leukocytosis in 20s despite resolution of infection and completion of antibiotic.   - S/p bone marrow biopsy on 10/25/2022 which showed reactive changes.  Cytogenetics and myeloid NGS is pending.  Patient will be informed if the testing is any significant.  Will arrange outpatient visit if needed.  Results were discussed with the patient at bedside.    #AKI on CKD - likely secondary to NSAIDs, and rhabdomyolysis  -Kappa 96.2, lambda 58, ratio 1.65.  SPEP/IFE no M protein.   Hematology will sign off.  Patient expressed understanding and was in agreement with this plan. He also understands that He can call clinic at any time with any questions, concerns, or complaints.   I spent a total of 60 minutes reviewing chart data, face-to-face evaluation with the patient, counseling and coordination of care as detailed above.  HPI: Joshua Mora is a 72 y.o. male with pmh of CKD stage III, alcohol use presented to ED follow-up with progressive weakness and falls.Marland Kitchen  He was found to have  AKI on CKD started on dialysis.  Renal ultrasound negative for obstruction.  Likely secondary from NSAID use and rhabdomyolysis.  Hospitalization complicated by fevers and Klebsiella UTI.  Blood cultures negative.  Patient on ceftriaxone.    On presentation 10/13/2022 WBC 46.7 predominantly neutrophilia.  More than 20% bands. WBC on 10/16/22 51.4, ANC 39.3, Monocytes 3.0. mild left shift with 1-5% metamyelocytes and occasional myelocytes. No blasts noted. Moncocytes fluctuating between normal and 3.0.    CBC with diff from 08/18/2022 was normal, WBC 10.1, Hb 13 and normal plts   Also found to have elevated D-dimer.  Venous Dopplers negative for DVT.  Negative VQ scan.  Was briefly treated with heparin drip which is discontinued now.   Hematology consulted for persistent leukocytosis.   REVIEW OF SYSTEMS:   ROS  As per HPI. Otherwise, a complete review of systems is negative.  PAST MEDICAL HISTORY: Past Medical History:  Diagnosis Date   Arthritis    Chest pain     PAST SURGICAL HISTORY: Past Surgical History:  Procedure Laterality Date   COLONOSCOPY WITH PROPOFOL N/A 09/12/2016   Procedure: COLONOSCOPY WITH PROPOFOL;  Surgeon: Scot Jun, MD;  Location: [facility] ENDOSCOPY;  Service: Endoscopy;  Laterality: N/A;   COLONOSCOPY WITH PROPOFOL N/A 04/02/2020   Procedure: COLONOSCOPY WITH PROPOFOL;  Surgeon: Pasty Spillers, MD;  Location: [facility] ENDOSCOPY;  Service: Endoscopy;  Laterality: N/A;   DIALYSIS/PERMA CATHETER INSERTION N/A 10/28/2022   Procedure: DIALYSIS/PERMA CATHETER INSERTION;  Surgeon: Renford Dills, MD;  Location: [facility] INVASIVE CV LAB;  Service: Cardiovascular;  Laterality: N/A;   IR  There is no acute osseous abnormality. IMPRESSION: Unchanged mild pulmonary edema. Electronically Signed   By: Lesia Hausen M.D.   On: 10/14/2022 16:02   ECHOCARDIOGRAM COMPLETE  Result Date: 10/14/2022    ECHOCARDIOGRAM REPORT   Patient Name:   ELLERY WIDER Date of Exam: 10/14/2022 Medical Rec #:  [id number]            Height:       69.0 in Accession #:    [id number]           Weight:       185.6 lb Date of Birth:  1950/10/30            BSA:          2.002 m Patient Age:    72 years             BP:           120/61 mmHg Patient Gender: M                    HR:           107 bpm. Exam Location:  [facility] Procedure: 2D Echo, Cardiac Doppler, Color Doppler and Intracardiac            Opacification Agent Indications:     Elevated Troponin  History:         Patient has no prior history of Echocardiogram examinations.                  Signs/Symptoms:Chest Pain; Risk Factors:Diabetes, Dyslipidemia                  and Current Smoker.  Sonographer:     Mikki Harbor Referring Phys:  [id number] Oliver Pila HALL Diagnosing Phys: Julien Nordmann MD  Sonographer Comments: Technically difficult study due to poor echo windows. Image acquisition challenging due to respiratory motion. IMPRESSIONS  1. Left ventricular ejection fraction, by estimation, is 60 to 65%. The left ventricle has normal function. The left ventricle has no regional wall motion abnormalities. There is mild left ventricular hypertrophy. Left  ventricular diastolic parameters are consistent with Grade I diastolic dysfunction (impaired relaxation).  2. Right ventricular systolic function is normal. The right ventricular size is normal. Tricuspid regurgitation signal is inadequate for assessing PA pressure.  3. The mitral valve is normal in structure. No evidence of mitral valve regurgitation. No evidence of mitral stenosis.  4. The aortic valve has an indeterminant number of cusps. Aortic valve regurgitation is mild. Aortic valve sclerosis is present, with no evidence of aortic valve stenosis.  5. The inferior vena cava is normal in size with greater than 50% respiratory variability, suggesting right atrial pressure of 3 mmHg. FINDINGS  Left Ventricle: Left ventricular ejection fraction, by estimation, is 60 to 65%. The left ventricle has normal function. The left ventricle has no regional wall motion abnormalities. Definity contrast agent was given IV to delineate the left ventricular  endocardial borders. The left ventricular internal cavity size was normal in size. There is mild left ventricular hypertrophy. Left ventricular diastolic parameters are consistent with Grade I diastolic dysfunction (impaired relaxation). Right Ventricle: The right ventricular size is normal. No increase in right ventricular wall thickness. Right ventricular systolic function is normal. Tricuspid regurgitation signal is inadequate for assessing PA pressure. Left Atrium: Left atrial size was normal in size. Right Atrium: Right atrial size was normal in size. Pericardium: There is no evidence of pericardial effusion. Mitral Valve: The

## 2022-10-31 NOTE — Progress Notes (Signed)
  Progress Note    10/31/2022 11:52 AM 3 Days Post-Op  Subjective:  Joshua Mora is a 72 year old male now status postop day #3 from temporary dialysis catheter placement.No complaints overnight and vitals all remain stable.    Vitals:   10/30/22 2315 10/31/22 0821  BP: 136/66 129/65  Pulse: 97 97  Resp: 18 17  Temp: 98.2 F (36.8 C)   SpO2: 95% 92%   Physical Exam: Cardiac:  RRR, normal S1 and S2.  No murmurs appreciated. Lungs: On auscultation all lung fields are clear but diminished in the bases.  Normal nonlabored breathing without rales, rhonchi or wheezing. Incisions: Right groin incision with temporary dialysis catheter in place.  No hematoma seroma to note.  Dressing is clean dry and intact. Extremities: Palpable pulses throughout. Abdomen: Positive bowel sounds throughout, soft, nontender and nondistended. Neurologic: Alert and oriented x 1, patient appears to be more alert than yesterday but agitated.  CBC    Component Value Date/Time   WBC 21.4 (H) 10/31/2022 0228   RBC 3.13 (L) 10/31/2022 0228   HGB 9.4 (L) 10/31/2022 0228   HCT 28.8 (L) 10/31/2022 0228   PLT 320 10/31/2022 0228   MCV 92.0 10/31/2022 0228   MCH 30.0 10/31/2022 0228   MCHC 32.6 10/31/2022 0228   RDW 16.1 (H) 10/31/2022 0228   LYMPHSABS 2.9 10/28/2022 0402   MONOABS 2.2 (H) 10/28/2022 0402   EOSABS 0.2 10/28/2022 0402   BASOSABS 0.1 10/28/2022 0402    BMET    Component Value Date/Time   NA 137 10/31/2022 0228   K 4.1 10/31/2022 0228   CL 104 10/31/2022 0228   CO2 24 10/31/2022 0228   GLUCOSE 99 10/31/2022 0228   BUN 53 (H) 10/31/2022 0228   CREATININE 2.57 (H) 10/31/2022 0228   CALCIUM 8.7 (L) 10/31/2022 0228   GFRNONAA 25 (L) 10/31/2022 0228    INR    Component Value Date/Time   INR 1.4 (H) 10/16/2022 1347     Intake/Output Summary (Last 24 hours) at 10/31/2022 1152 Last data filed at 10/31/2022 0548 Gross per 24 hour  Intake 120 ml  Output 1050 ml  Net -930 ml      Assessment/Plan:  72 y.o. male is s/p temporary dialysis catheter in right femoral vein.  3 Days Post-Op   Vascular surgery will sign off this case today as the patients temporary dialysis catheter is working well. No hematoma, seroma or infection to note. Please reconsult in the future if needed.    DVT prophylaxis:  Heparin SQ.    Marcie [facility] 10/31/2022 11:52 AM

## 2022-10-31 NOTE — Plan of Care (Signed)

## 2022-11-01 DIAGNOSIS — N179 Acute kidney failure, unspecified: Secondary | ICD-10-CM | POA: Diagnosis not present

## 2022-11-01 DIAGNOSIS — D72829 Elevated white blood cell count, unspecified: Secondary | ICD-10-CM | POA: Diagnosis not present

## 2022-11-01 DIAGNOSIS — N186 End stage renal disease: Secondary | ICD-10-CM | POA: Diagnosis not present

## 2022-11-01 DIAGNOSIS — R531 Weakness: Secondary | ICD-10-CM | POA: Diagnosis not present

## 2022-11-01 LAB — CBC
HCT: 30 % — ABNORMAL LOW (ref 39.0–52.0)
Hemoglobin: 9.6 g/dL — ABNORMAL LOW (ref 13.0–17.0)
MCH: 30.4 pg (ref 26.0–34.0)
MCHC: 32 g/dL (ref 30.0–36.0)
MCV: 94.9 fL (ref 80.0–100.0)
Platelets: 313 10*3/uL (ref 150–400)
RBC: 3.16 MIL/uL — ABNORMAL LOW (ref 4.22–5.81)
RDW: 16.4 % — ABNORMAL HIGH (ref 11.5–15.5)
WBC: 21.3 10*3/uL — ABNORMAL HIGH (ref 4.0–10.5)
nRBC: 0 % (ref 0.0–0.2)

## 2022-11-01 LAB — BASIC METABOLIC PANEL
Anion gap: 10 (ref 5–15)
BUN: 55 mg/dL — ABNORMAL HIGH (ref 8–23)
CO2: 25 mmol/L (ref 22–32)
Calcium: 8.7 mg/dL — ABNORMAL LOW (ref 8.9–10.3)
Chloride: 102 mmol/L (ref 98–111)
Creatinine, Ser: 2.66 mg/dL — ABNORMAL HIGH (ref 0.61–1.24)
GFR, Estimated: 24 mL/min — ABNORMAL LOW (ref >60)
Glucose, Bld: 104 mg/dL — ABNORMAL HIGH (ref 70–99)
Potassium: 4.2 mmol/L (ref 3.5–5.1)
Sodium: 137 mmol/L (ref 135–145)

## 2022-11-01 LAB — MAGNESIUM: Magnesium: 2.2 mg/dL (ref 1.7–2.4)

## 2022-11-01 MED ORDER — HYDROCODONE-ACETAMINOPHEN 5-325 MG PO TABS
1.0000 | ORAL_TABLET | Freq: Four times a day (QID) | ORAL | Status: DC | PRN
  Administered 2022-11-01 – 2022-11-02 (×4): 1 via ORAL
  Filled 2022-11-01 (×4): qty 1

## 2022-11-01 NOTE — Progress Notes (Signed)
Palliative Care Progress Note, Assessment & Plan   Patient Name: Joshua Mora       Date: 11/01/2022 DOB: February 03, 1951  Age: 72 y.o. MRN#: [id number] Attending Physician: Darlin Priestly, MD Primary Care Physician: Sandrea Hughs, NP Admit Date: 10/13/2022  Subjective: Patient is lying in bed in no apparent distress.  He is awake, alert, and oriented x 4.  He acknowledges my presence and is able to make his wishes known.  No family or friends present during my visit.  HPI: Joshua Mora is a 72 y.o. male with medical history significant of alcohol abuse, tobacco use disorder, diet controlled prediabetes, CKD 3B, osteoarthritis s/p left knee replacement who presented to the ED with complaints of progressive weakness and frequent falls over the past 1 month.     On presentation he was found to have severely elevated creatinine above baseline for which nephrology was consulted.  Also with significant leukocytosis above 46,000 with concern for leukemoid reaction in the setting of presumptive UTI.  Trauma imaging were nonacute.  CT head also nonacute.  Hospital course was complicated by acute hypoxic respiratory failure.  Was found to have mild pulmonary edema on chest x-ray.  Elevated D-dimer above 11 with concern for possible pulmonary embolism.  Heparin drip initiated, d/c'ed when V/Q scan and DVT neg.  Patient is being treated for acute renal failure - now on hemodialysis and left ankle pain.  PMT was consulted to discuss goals of care.  Summary of counseling/coordination of care: Extensive chart review completed prior to meeting patient including labs, vital signs, imaging, progress notes, orders, and available advanced directive documents from current and previous encounters.   After reviewing the  patient's chart and assessing the patient at bedside, I spoke with patient in regards to symptom management and plan of care.  Symptoms assessed.  Patient endorses aching discomfort in his left lower extremity.  He declined palpation or movement of left ankle but was able to move his toes and endorsed sensation with palpation of toes.  He shares the medication that he gets as needed is managing his pain well.  He denies other complaints such as N/V/D, headache, chest pain, or dizziness.  No adjustment to more needed at this time but patient encouraged to use as needed medications to better minimize his discomfort.  I attempted to gauge his understanding of his current medical situation.  He shares that he had a fall, they found out that he had bad kidneys, and now he is on hemodialysis.  He remains agreeable to continue with hemodialysis.  I attempted to elicit values and goals important to the patient.  Patient's short-term goal is to get out of the hospital to be able to return home.  He shares he is not sure if he needs to go to rehab or not but he will discuss it when the time comes.  He says what is important to him is that he get home, work on his relationship with his sister, and hopefully have better lines of communication with her.  As far as his medical plan of care, he shares he is accepting of the offered recommendations to continue with hemodialysis at this time.  Treat  the treatable to continue.  Boundaries of care discussed.  CODE STATUS -full and DNR - reviewed in detail.  Patient confirmed full CODE STATUS.  His next of kin decision maker would be his 3 children with his daughter Joshua Mora as "the one in charge".  Encouraged patient to consider DNR/DNI status understanding evidenced based poor outcomes in similar hospitalized patients as the cause of the arrest is likely associated with chronic/terminal disease rather than a reversible acute cardio-pulmonary event.  No change to plan of care  or boundaries at this time.  PMT will continue to follow and support patient and family throughout his hospitalization.  Physical Exam Vitals reviewed.  Constitutional:      General: He is not in acute distress.    Appearance: He is normal weight.  HENT:     Head: Normocephalic.     Mouth/Throat:     Mouth: Mucous membranes are moist.  Eyes:     Pupils: Pupils are equal, round, and reactive to light.  Cardiovascular:     Rate and Rhythm: Normal rate.  Pulmonary:     Effort: Pulmonary effort is normal.  Abdominal:     Palpations: Abdomen is soft.  Musculoskeletal:     Comments: LLE pain with movement, MAETC  Skin:    General: Skin is warm and dry.  Neurological:     Mental Status: He is alert and oriented to person, place, and time.  Psychiatric:        Mood and Affect: Mood normal.        Behavior: Behavior normal.        Thought Content: Thought content normal.        Judgment: Judgment normal.             Time spent includes: Detailed review of medical records (labs, imaging, vital signs), medically appropriate exam (mental status, respiratory, cardiac, skin), discussed with treatment team, counseling and educating patient, family and staff, documenting clinical information, medication management and coordination of care.  Total Time 35 minutes   Joshua Mora L. Bonita Quin, DNP, FNP-BC Palliative Medicine Team

## 2022-11-01 NOTE — Progress Notes (Signed)
PROGRESS NOTE    Joshua Mora  MVH:[id number] DOB: [birth year] DOA: 10/13/2022 PCP: Sandrea Hughs, NP  139A/[id number]-AA  LOS: 19 days   Brief hospital course:   Assessment & Plan: Joshua Mora is a 72 y.o. male with medical history significant of alcohol abuse, tobacco use disorder, diet controlled prediabetes, CKD 3B, osteoarthritis s/p left knee replacement who presented to the ED with complaints of progressive weakness and frequent falls over the past 1 month.     On presentation he was found to have severely elevated creatinine above baseline for which nephrology was consulted.  Also with significant leukocytosis above 46,000 with concern for leukemoid reaction in the setting of presumptive UTI.  Trauma imaging were nonacute.  CT head also nonacute.  Hospital course was complicated by acute hypoxic respiratory failure.  Was found to have mild pulmonary edema on chest x-ray.  Elevated D-dimer above 11 with concern for possible pulmonary embolism.  Heparin drip initiated, d/c'ed when V/Q scan and DVT neg.  * Acute renal failure (HCC) CKD 3b --Baseline creatinine of 1.74, GFR of 41 on 08/18/22. Suspect AKI secondary to NSAID induced nephropathy and concurrent sepsis. No history of diabetes or hypertension per patient. Renal ultrasound with no obstruction. No IV contrast.   -Underwent first hemodialysis treatment on 10/15/2022, first series lasted until 10/21/22.  Then resumed hemodialysis on 9/10 and 9/12.  --PermCath placed on 9/13.   --dialysis has been held, pt with stable labs and mental status. Plan: --cont to hold dialysis and monitor for renal recovery, per nephro   Severe sepsis [facility]) Patient met sepsis criteria with tachycardia, severe leukocytosis and severe sepsis with lactic acidosis above 4.3.  Initial procalcitonin was 60.67 which is decreasing. Did develop a low-grade fever overnight again.  Procalcitonin improving.  Preliminary blood cultures negative, urine  cultures with Klebsiella pneumonia which shows only resistance to ampicillin and Augmentin.  Patient did received ceftriaxone for the past 3 days.  Vancomycin was added on 10/16/2022.  Left knee aspirate with no growth so far. -ID consult -Completed the course of antibiotic and ID is advising monitoring without antibiotic for now.   Leukocytosis Slowly improving, pathologist smear review with immature granulocytes, initial smear review with bandemia.  Urine cultures with Klebsiella pneumonia but patient is being treated with appropriate antibiotics. Significantly elevated D-dimer which continued to get worse.  VTE has been ruled out with negative VQ scan and lower extremity venous Doppler. -Completed the course of antibiotic -Hematology consult-ordered some more labs, likely leukemoid reaction with severe inflammation and infection but due to persistent elevation patient underwent bone marrow biopsy on 9/10-showed reactive changes. Cytogenetics and myeloid NGS is pending.  Results discussed with pt by Dr. Alena Bills.   Elevated d-dimer Patient with worsening D-dimer, now at 13.  COVID was negative. DVT has been ruled out with negative VQ scan and lower extremity venous Doppler.   Acute hypoxemic respiratory failure (HCC) Patient did develop acute hypoxic respiratory failure and repeat chest x-ray at that time with concern of pulmonary congestion.  Echocardiogram with normal EF and grade 1 diastolic dysfunction.  VQ scan negative for PE Patient with significant AKI, also concern of NSAID induced renal injury. Started on dialysis. --weaned down on room air  Frequent falls Patient with history of recent decline, poor appetite and multiple falls.  First fall was on July 4 and since then he was falling couple of times a week.  Left knee imaging done on 8/23 with a concern of nondisplaced fibular neck  fracture, Initial plan was outpatient follow-up with orthopedic surgery. Requested orthopedic surgery  to evaluate while he is in hospital B12 normal, significantly low vitamin D-started on supplement. --SNF rehab   Nicotine dependence, uncomplicated -Nicotine patch   Traumatic rhabdomyolysis (HCC) Likely secondary to recurrent falls.   Hyperkalemia --s/p Lokelma and Veltassa.  Now resolved   Left ankle pain Patient continued to have worsening pain and edema of left ankle prompted an MRI of left ankle as initial imaging was negative for any fracture. MRI with complete tear of talofibular ligament and multiple other ligamental injuries. -Podiatry was consulted-recommending conservative management with pain control and CAM boot for 4 to 6 weeks and outpatient follow-up --switch from Tramadol to Norco    DVT prophylaxis: Heparin SQ Code Status: Full code  Family Communication: daughter updated at bedside today Level of care: Telemetry Medical Dispo:   The patient is from: home Anticipated d/c is to: SNF rehab Anticipated d/c date is: whenever bed available   Subjective and Interval History:  Pt complained of uncontrolled left ankle intermittently.   Objective: Vitals:   10/31/22 1615 10/31/22 2310 11/01/22 0820 11/01/22 1617  BP: 110/61 114/65 106/66 112/66  Pulse: (!) 103 96 98 (!) 102  Resp: 16 16 16 16   Temp: 98 F (36.7 C) 97.9 F (36.6 C) (!) 97.5 F (36.4 C) 97.6 F (36.4 C)  TempSrc:      SpO2: 97% 96% 98% 96%  Weight:      Height:        Intake/Output Summary (Last 24 hours) at 11/01/2022 2020 Last data filed at 11/01/2022 1903 Gross per 24 hour  Intake 0 ml  Output 350 ml  Net -350 ml   Filed Weights   10/28/22 0500 10/29/22 0500 10/31/22 0500  Weight: 80.1 kg 81.3 kg 82 kg    Examination:   Constitutional: NAD, AAOx3 HEENT: conjunctivae and lids normal, EOMI CV: No cyanosis.   RESP: normal respiratory effort, on RA Neuro: II - XII grossly intact.   Psych: depressed mood and affect.  Appropriate judgement and reason    Data Reviewed: I have  personally reviewed labs and imaging studies  Time spent: 35 minutes  Darlin Priestly, MD [facility] If 7PM-7AM, please contact night-coverage 11/01/2022, 8:20 PM

## 2022-11-01 NOTE — Plan of Care (Signed)

## 2022-11-01 NOTE — Progress Notes (Signed)
Nutrition Follow-up  DOCUMENTATION CODES:   Not applicable  INTERVENTION:   -Continue Nepro Shake po TID, each supplement provides 425 kcal and 19 grams protein  -Continue Renal MVI daily -Continue 30 ml Prosource Plus BID, each supplement provides 100 kcals and 15 grams protein -Liberalize diet to 2 gram sodium for wider variety of meal selections  NUTRITION DIAGNOSIS:   Inadequate oral intake related to poor appetite as evidenced by per patient/family report.  Ongoing  GOAL:   Patient will meet greater than or equal to 90% of their needs  Progressing   MONITOR:   PO intake, Supplement acceptance, Labs, Weight trends, Skin, I & O's, Diet advancement  REASON FOR ASSESSMENT:   Malnutrition Screening Tool    ASSESSMENT:   72 y.o. male with medical history significant of osteoarthritis s/p left knee replacement who is admitted with weakness, frequent falls and AKI.  8/31- trialysis cath placed, first HD treatment  9/2- aspiration of lt knee by orthopedics to rule out infection; cultures pending 9/6- second HD treatment 9/7- temporary HD cath removed 9/10- temporary HD cath placed  Reviewed I/O's: -350 ml x 24 hours and -8.7 L since 10/18/22   Pt on a renal diet with 1.5 L fluid restriction. Pt with very poor oral intake. Noted meal completions 10-50%. He is drinking Nepro and Prosource supplements. RD will liberalize diet for wider variety of meal selections.   Per MD notes, pt awaiting SNF placement for discharge.   Wt has been stable over the past week.   Pt desires any offered medical treatments and would like to continue full scope care per palliative care.   Medications reviewed and include vitamin C, vitamin B-12, folic acid, vitamin D, and thiamine.   Labs reviewed: CBGS: 88-111.    Diet Order:   Diet Order             Diet renal with fluid restriction Fluid restriction: 1500 mL Fluid; Fluid consistency: Thin  Diet effective now                    EDUCATION NEEDS:   Education needs have been addressed  Skin:  Skin Assessment: Reviewed RN Assessment  Last BM:  11/01/22 (type 6)  Height:   Ht Readings from Last 1 Encounters:  10/14/22 5\' 9"  (1.753 m)    Weight:   Wt Readings from Last 1 Encounters:  10/31/22 82 kg    Ideal Body Weight:  72.7 kg  BMI:  Body mass index is 26.7 kg/m.  Estimated Nutritional Needs:   Kcal:  1900-2200kcal/day  Protein:  95-110g/day  Fluid:  1000 ml + UOP    Levada Schilling, RD, LDN, CDCES Registered Dietitian II Certified Diabetes Care and Education Specialist Please refer to [facility] for RD and/or RD on-call/weekend/after hours pager

## 2022-11-01 NOTE — Progress Notes (Signed)
[facility]  ROUNDING NOTE   Subjective:   Patient sitting up in bed Untouched breakfast tray at bedside No family present Room air Right leg pain well managed   Creatinine 2.66 (2.57) (2.8) (2.75) (2.73)  Last hemodialysis treatment on 9/12.    Objective:  Vital signs in last 24 hours:  Temp:  [97.5 F (36.4 C)-98 F (36.7 C)] 97.5 F (36.4 C) (09/17 0820) Pulse Rate:  [96-103] 98 (09/17 0820) Resp:  [16] 16 (09/17 0820) BP: (106-114)/(61-66) 106/66 (09/17 0820) SpO2:  [96 %-98 %] 98 % (09/17 0820)  Weight change:  Filed Weights   10/28/22 0500 10/29/22 0500 10/31/22 0500  Weight: 80.1 kg 81.3 kg 82 kg    Intake/Output: I/O last 3 completed shifts: In: 120 [P.O.:120] Out: 1400 [Urine:1400]   Intake/Output this shift:  No intake/output data recorded.  Physical Exam: General: NAD  Head: Normocephalic, atraumatic. Moist oral mucosal membranes  Eyes: Anicteric  Neck: Supple  Lungs:  Clear to auscultation  Heart: Regular rate and rhythm  Abdomen:  Soft, nontender,   Extremities:  No peripheral edema.  Neurologic: Alertl, moving all four extremities  Skin: No lesions  Access: RIJ permcath    Basic Metabolic Panel: Recent Labs  Lab 10/25/22 1258 10/25/22 1615 10/26/22 0528 10/26/22 1008 10/27/22 0403 10/28/22 0854 10/29/22 0426 10/30/22 0531 10/31/22 0228 11/01/22 0325  NA 138  --  137  --  138 137 137 136 137 137  K 6.2*   < > 5.0   < > 4.8 5.2* 4.2 4.0 4.1 4.2  CL 104  --  102  --  101 98 102 100 104 102  CO2 22  --  25  --  24 27 25 25 24 25   GLUCOSE 106*  --  102*  --  119* 103* 120* 105* 99 104*  BUN 92*  --  67*  --  79* 62* 65* 61* 53* 55*  CREATININE 3.50*  --  2.77*  --  3.12* 2.73* 2.75* 2.80* 2.57* 2.66*  CALCIUM 8.7*  --  8.2*  --  8.5* 8.4* 8.6* 8.7* 8.7* 8.7*  MG  --   --   --   --   --   --  2.2 2.2 2.1 2.2  PHOS 6.4*  --  5.5*  --  6.0*  --   --   --   --   --    < > = values in this interval not displayed.     Liver Function Tests: Recent Labs  Lab 10/25/22 1258 10/26/22 0528 10/27/22 0403  ALBUMIN 2.3* 2.2* 2.2*   No results for input(s): "LIPASE", "AMYLASE" in the last 168 hours. No results for input(s): "AMMONIA" in the last 168 hours.  CBC: Recent Labs  Lab 10/27/22 0403 10/28/22 0402 10/29/22 0426 10/30/22 0531 10/31/22 0228 11/01/22 0325  WBC 23.0* 29.8* 25.1* 23.8* 21.4* 21.3*  NEUTROABS 17.8* 24.2*  --   --   --   --   HGB 9.0* 9.8* 9.5* 9.5* 9.4* 9.6*  HCT 26.7* 29.8* 28.9* 28.9* 28.8* 30.0*  MCV 89.3 92.3 90.6 92.6 92.0 94.9  PLT 463* 355 351 350 320 313    Cardiac Enzymes: No results for input(s): "CKTOTAL", "CKMB", "CKMBINDEX", "TROPONINI" in the last 168 hours.  BNP: Invalid input(s): "POCBNP"  CBG: Recent Labs  Lab 10/28/22 0810 10/28/22 1648 10/30/22 0832 10/31/22 0822  GLUCAP 89 111* 86 88    Microbiology: Results for orders placed or performed during the hospital  encounter of 10/13/22  SARS Coronavirus 2 by RT PCR (hospital order, performed in [facility] hospital lab) *cepheid single result test* Anterior Nasal Swab     Status: None   Collection Time: 10/13/22  3:55 PM   Specimen: Anterior Nasal Swab  Result Value Ref Range Status   SARS Coronavirus 2 by RT PCR NEGATIVE NEGATIVE Final    Comment: (NOTE) SARS-CoV-2 target nucleic acids are NOT DETECTED.  The SARS-CoV-2 RNA is generally detectable in upper and lower respiratory specimens during the acute phase of infection. The lowest concentration of SARS-CoV-2 viral copies this assay can detect is 250 copies / mL. A negative result does not preclude SARS-CoV-2 infection and should not Mora used as the sole basis for treatment or other patient management decisions.  A negative result may occur with improper specimen collection / handling, submission of specimen other than nasopharyngeal swab, presence of viral mutation(s) within the areas targeted by this assay, and inadequate number of  viral copies (<250 copies / mL). A negative result must Mora combined with clinical observations, patient history, and epidemiological information.  Fact Sheet for Patients:   RoadLapTop.co.za  Fact Sheet for Healthcare Providers: http://kim-miller.com/  This test is not yet approved or  cleared by the [place] FDA and has been authorized for detection and/or diagnosis of SARS-CoV-2 by FDA under an Emergency Use Authorization (EUA).  This EUA will remain in effect (meaning this test can Mora used) for the duration of the COVID-19 declaration under Section 564(b)(1) of the Act, 21 U.S.C. section 360bbb-3(b)(1), unless the authorization is terminated or revoked sooner.  Performed at [facility], [address] [place], [place] [address]   MRSA Next Gen by PCR, Nasal     Status: None   Collection Time: 10/13/22  9:51 PM   Specimen: Nasal Mucosa; Nasal Swab  Result Value Ref Range Status   MRSA by PCR Next Gen NOT DETECTED NOT DETECTED Final    Comment: (NOTE) The GeneXpert MRSA Assay (FDA approved for NASAL specimens only), is one component of a comprehensive MRSA colonization surveillance program. It is not intended to diagnose MRSA infection nor to guide or monitor treatment for MRSA infections. Test performance is not FDA approved in patients less than 38 years old. Performed at [facility], [address], [place], [place] [address]   Urine Culture     Status: Abnormal   Collection Time: 10/14/22  7:32 AM   Specimen: Urine, Random  Result Value Ref Range Status   Specimen Description   Final    URINE, RANDOM Performed at [facility], [address] [place], [place] [address]    Special Requests   Final    NONE Reflexed from [id number] Performed at [facility], [address] [place], [place] [address]    Culture >=100,000 COLONIES/mL KLEBSIELLA PNEUMONIAE (A)  Final   Report Status  10/16/2022 FINAL  Final   Organism ID, Bacteria KLEBSIELLA PNEUMONIAE (A)  Final      Susceptibility   Klebsiella pneumoniae - MIC*    AMPICILLIN >=32 RESISTANT Resistant     CEFAZOLIN <=4 SENSITIVE Sensitive     CEFEPIME <=0.12 SENSITIVE Sensitive     CEFTRIAXONE <=0.25 SENSITIVE Sensitive     CIPROFLOXACIN <=0.25 SENSITIVE Sensitive     GENTAMICIN <=1 SENSITIVE Sensitive     IMIPENEM <=0.25 SENSITIVE Sensitive     NITROFURANTOIN 128 RESISTANT Resistant     TRIMETH/SULFA <=20 SENSITIVE Sensitive     AMPICILLIN/SULBACTAM 8  SENSITIVE Sensitive     PIP/TAZO <=4 SENSITIVE Sensitive     * >=100,000 COLONIES/mL KLEBSIELLA PNEUMONIAE  Culture, blood (Routine X 2) w Reflex to ID Panel     Status: None   Collection Time: 10/15/22  1:29 AM   Specimen: Right Antecubital; Blood  Result Value Ref Range Status   Specimen Description RIGHT ANTECUBITAL  Final   Special Requests   Final    BOTTLES DRAWN AEROBIC AND ANAEROBIC Blood Culture adequate volume   Culture   Final    NO GROWTH 5 DAYS Performed at [facility], [address] [place], [place] [address]    Report Status 10/20/2022 FINAL  Final  Culture, blood (Routine X 2) w Reflex to ID Panel     Status: None   Collection Time: 10/15/22 11:41 AM   Specimen: BLOOD  Result Value Ref Range Status   Specimen Description BLOOD BLOOD RIGHT HAND  Final   Special Requests   Final    BOTTLES DRAWN AEROBIC ONLY Blood Culture results may not Mora optimal due to an inadequate volume of blood received in culture bottles   Culture   Final    NO GROWTH 5 DAYS Performed at [facility], [address] [place], [place] [address]    Report Status 10/20/2022 FINAL  Final  Body fluid culture w Gram Stain     Status: None   Collection Time: 10/17/22  2:02 PM   Specimen: Synovium; Body Fluid  Result Value Ref Range Status   Specimen Description   Final    SYNOVIAL Performed at [facility], [address]  [place], [place] [address]    Special Requests   Final    Normal Performed at [facility], [address] [place], [place] [address]    Gram Stain   Final    FEW WBC PRESENT,BOTH PMN AND MONONUCLEAR NO ORGANISMS SEEN    Culture   Final    NO GROWTH 3 DAYS Performed at [facility], [address], [place], [place] [address]    Report Status 10/21/2022 FINAL  Final    Coagulation Studies: No results for input(s): "LABPROT", "INR" in the last 72 hours.  Urinalysis: No results for input(s): "COLORURINE", "LABSPEC", "PHURINE", "GLUCOSEU", "HGBUR", "BILIRUBINUR", "KETONESUR", "PROTEINUR", "UROBILINOGEN", "NITRITE", "LEUKOCYTESUR" in the last 72 hours.  Invalid input(s): "APPERANCEUR"    Imaging: No results found.   Medications:    sodium chloride     calcium gluconate     sodium chloride      (feeding supplement) PROSource Plus  30 mL Oral BID BM   vitamin C  500 mg Oral BID   Chlorhexidine Gluconate Cloth  6 each Topical Q0600   cyanocobalamin  1,000 mcg Oral Daily   insulin aspart  10 Units Intravenous Once   And   dextrose  1 ampule Intravenous Once   feeding supplement (NEPRO CARB STEADY)  237 mL Oral TID BM   folic acid  1 mg Oral Daily   heparin injection (subcutaneous)  5,000 Units Subcutaneous Q8H   multivitamin  1 tablet Oral QHS   nicotine  14 mg Transdermal Daily   thiamine  100 mg Oral Daily   Or   thiamine  100 mg Intravenous Daily   traMADol  50 mg Oral Once   Vitamin D (Ergocalciferol)  50,000 Units Oral Q7 days   acetaminophen, chlorproMAZINE, heparin, melatonin, morphine injection, ondansetron **OR** ondansetron (ZOFRAN) IV, mouth rinse, polyethylene glycol, prochlorperazine, traMADol,  traZODone  Assessment/ Plan:  Mr. Joshua Mora is a 72 y.o.  male with tobacco use, alcohol abuse, osteoarthritis status post left knee replacement and hyperlipidemia, who is admitted on 10/13/2022 for AKI (acute kidney injury) [facility])  [N17.9]  Acute Kidney Injury on chronic kidney disease stage IIIB: Baseline creatinine of 1.74, GFR of 41 on 08/18/22. Suspect patient's acute kidney injury secondary to NSAID induced nephropathy and concurrent sepsis. No history of diabetes or hypertension per patient. Renal ultrasound with no obstruction. No IV contrast. -Underwent first hemodialysis treatment on 10/15/2022, first series lasted until 10/21/22.  Then resumed hemodialysis on 9/10 and 9/12.  - Creatinine stable today. Mentating appropriately  - Will continue monitoring for renal recovery.  - Dialysis held  2. Urinary tract infection with sepsis: Klebsiella species.  - antibiotics completed  3.  Anemia with renal failure: hemoglobin 9.6. Has received EPO during dialysis. Will continue to monitor.  4. 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

## 2022-11-01 NOTE — Plan of Care (Signed)
Problem: Education: Goal: Knowledge of General Education information will improve Description: Including pain rating scale, medication(s)/side effects and non-pharmacologic comfort measures Outcome: Progressing   Problem: Health Behavior/Discharge Planning: Goal: Ability to manage health-related needs will improve Outcome: Progressing   Problem: Activity: Goal: Risk for activity intolerance will decrease Outcome: Progressing   Problem: Elimination: Goal: Will not experience complications related to bowel motility Outcome: Progressing   Problem: Pain Managment: Goal: General experience of comfort will improve Outcome: Progressing   Problem: Skin Integrity: Goal: Risk for impaired skin integrity will decrease Outcome: Progressing

## 2022-11-02 DIAGNOSIS — N179 Acute kidney failure, unspecified: Secondary | ICD-10-CM | POA: Diagnosis not present

## 2022-11-02 MED ORDER — HYDROCODONE-ACETAMINOPHEN 5-325 MG PO TABS
1.0000 | ORAL_TABLET | ORAL | Status: DC | PRN
  Administered 2022-11-02 – 2022-11-03 (×4): 2 via ORAL
  Filled 2022-11-02 (×4): qty 2

## 2022-11-02 NOTE — Plan of Care (Signed)

## 2022-11-02 NOTE — Progress Notes (Signed)
Occupational Therapy Treatment Patient Details Name: Joshua Mora MRN: [id number] DOB: [birth year] Today's Date: 11/02/2022   History of present illness Pt is a 72 year old presenting to the ED with progressive weakness and frequent falls; admitted with AKI, leukocytosis  PMH significant for osteoarthritis s/p left knee replacement. Pt found to have L fibular head fx. Now with multiple L ankle ligament injuries for which CAM boot is recommended with mobility   OT comments  Pt received semi-reclined in bed. Appearing alert, fatigued; willing to work with OT on sitting EOB. T/f MAX A x2 to EOB. Poor postural control. See flowsheet below for further details of session. Left semi-reclined in bed, prevalon boots on, with all needs in reach.        If plan is discharge home, recommend the following:  A lot of help with walking and/or transfers;A lot of help with bathing/dressing/bathroom;Supervision due to cognitive status;Assist for transportation;Direct supervision/assist for medications management;Direct supervision/assist for financial management;Help with stairs or ramp for entrance   Equipment Recommendations  Other (comment) (defer to next venue)    Recommendations for Other Services      Precautions / Restrictions Precautions Precautions: Fall Required Braces or Orthoses: Other Brace Other Brace: CAM boot off while in bed, prevalons in bed. CAM boot when up Restrictions Weight Bearing Restrictions: Yes LLE Weight Bearing: Weight bearing as tolerated       Mobility Bed Mobility Overal bed mobility: Needs Assistance Bed Mobility: Supine to Sit, Sit to Supine     Supine to sit: Max assist, +2 for physical assistance Sit to supine: Total assist, +2 for physical assistance   General bed mobility comments: Pt able to slide BIL feet over slightly towards EOB, but still requiring MAX A to move LE and trunk    Transfers                   General transfer comment:  Pt declined transfer to standing today     Balance Overall balance assessment: Needs assistance Sitting-balance support: Bilateral upper extremity supported, Feet supported Sitting balance-Leahy Scale: Poor Sitting balance - Comments: unable to hold sitting balance today at EOB more than a few seconds before needing UE assist and OT assist to maintain balance (MIN-MOD) Postural control: Posterior lean                                 ADL either performed or assessed with clinical judgement   ADL Overall ADL's : Needs assistance/impaired   Eating/Feeding Details (indicate cue type and reason): Pt refused OT offer of eating breakfast while seated EOB. Pt gave tray back to dining services during session. Did not want it. Grooming: Sitting;Wash/dry face;Minimal assistance Grooming Details (indicate cue type and reason): Assist needed for sitting balance. Pt using RUE (non-dominant) mostly, although at times using BIL hands. Often with L hand on bed rail.             Lower Body Dressing: Total assistance Lower Body Dressing Details (indicate cue type and reason): Total A to don/doff prevalon boots and CAM boot and R shoe                    Extremity/Trunk Assessment Upper Extremity Assessment Upper Extremity Assessment: Generalized weakness   Lower Extremity Assessment Lower Extremity Assessment: Defer to PT evaluation;Generalized weakness        Vision       Perception  Praxis      Cognition Arousal: Alert Behavior During Therapy: WFL for tasks assessed/performed Overall Cognitive Status:  (not assessed today; able to follow cues during session; flat affect.)                                          Exercises      Shoulder Instructions       General Comments Pt able to sit up at EOB x10 minutes with nearly constant trunk support. On RA today; O2 96% on EOB, although pt noted to be breathing heavily at times during session.  HR 96.    Pertinent Vitals/ Pain       Pain Assessment Pain Assessment: 0-10 Pain Score: 5  Pain Location: L foot/ankle; BIL knees Pain Descriptors / Indicators: Aching Pain Intervention(s): Limited activity within patient's tolerance  Home Living                                          Prior Functioning/Environment              Frequency  Min 1X/week        Progress Toward Goals  OT Goals(current goals can now be found in the care plan section)  Progress towards OT goals: Progressing toward goals  Acute Rehab OT Goals Patient Stated Goal: Get better OT Goal Formulation: With patient Time For Goal Achievement: 11/28/22 Potential to Achieve Goals: Fair ADL Goals Pt Will Perform Grooming: sitting;with modified independence Pt Will Perform Lower Body Dressing: with modified independence;sitting/lateral leans Pt Will Transfer to Toilet: with modified independence;ambulating Pt Will Perform Toileting - Clothing Manipulation and hygiene: with min assist;sitting/lateral leans  Plan      Co-evaluation                 AM-PAC OT "6 Clicks" Daily Activity     Outcome Measure   Help from another person eating meals?: A Little Help from another person taking care of personal grooming?: A Little Help from another person toileting, which includes using toliet, bedpan, or urinal?: Total Help from another person bathing (including washing, rinsing, drying)?: Total Help from another person to put on and taking off regular upper body clothing?: A Lot Help from another person to put on and taking off regular lower body clothing?: Total 6 Click Score: 11    End of Session    OT Visit Diagnosis: Other abnormalities of gait and mobility (R26.89);Muscle weakness (generalized) (M62.81)   Activity Tolerance Patient limited by pain;Patient tolerated treatment well   Patient Left in bed;with call bell/phone within reach;with bed alarm set;Other (comment)  (prevalon boots on)   Nurse Communication Mobility status        Time: 7062-3762 OT Time Calculation (min): 32 min  Charges: OT General Charges $OT Visit: 1 Visit OT Treatments $Therapeutic Activity: 23-37 mins  Linward Foster, MS, OTR/L   Alvester Morin 11/02/2022, 10:41 AM

## 2022-11-02 NOTE — Progress Notes (Signed)
[facility]  ROUNDING NOTE   Subjective:   Patient irritable today States he can't receive assistance with any request Voices frustrations of extended admission  Creatinine stable  Last hemodialysis treatment on 9/12.    Objective:  Vital signs in last 24 hours:  Temp:  [97.6 F (36.4 C)-98 F (36.7 C)] 98 F (36.7 C) (09/18 0829) Pulse Rate:  [73-102] 99 (09/18 0829) Resp:  [16-18] 16 (09/18 0829) BP: (112-114)/(50-66) 114/62 (09/18 0829) SpO2:  [96 %-98 %] 98 % (09/18 0829) Weight:  [79.6 kg] 79.6 kg (09/18 0500)  Weight change:  Filed Weights   10/29/22 0500 10/31/22 0500 11/02/22 0500  Weight: 81.3 kg 82 kg 79.6 kg    Intake/Output: I/O last 3 completed shifts: In: 0  Out: 350 [Urine:350]   Intake/Output this shift:  Total I/O In: -  Out: 200 [Urine:200]  Physical Exam: General: NAD  Head: Normocephalic, atraumatic. Moist oral mucosal membranes  Eyes: Anicteric  Neck: Supple  Lungs:  Clear to auscultation,normal effort  Heart: Regular rate and rhythm  Abdomen:  Soft, nontender  Extremities:  No peripheral edema.  Neurologic: Alertl, moving all four extremities  Skin: No lesions  Access: RIJ permcath    Basic Metabolic Panel: Recent Labs  Lab 10/27/22 0403 10/28/22 0854 10/29/22 0426 10/30/22 0531 10/31/22 0228 11/01/22 0325 11/02/22 0240  NA 138   < > 137 136 137 137 137  K 4.8   < > 4.2 4.0 4.1 4.2 4.4  CL 101   < > 102 100 104 102 105  CO2 24   < > 25 25 24 25 24   GLUCOSE 119*   < > 120* 105* 99 104* 96  BUN 79*   < > 65* 61* 53* 55* 54*  CREATININE 3.12*   < > 2.75* 2.80* 2.57* 2.66* 2.72*  CALCIUM 8.5*   < > 8.6* 8.7* 8.7* 8.7* 8.7*  MG  --   --  2.2 2.2 2.1 2.2 2.2  PHOS 6.0*  --   --   --   --   --   --    < > = values in this interval not displayed.    Liver Function Tests: Recent Labs  Lab 10/27/22 0403  ALBUMIN 2.2*   No results for input(s): "LIPASE", "AMYLASE" in the last 168 hours. No results for  input(s): "AMMONIA" in the last 168 hours.  CBC: Recent Labs  Lab 10/27/22 0403 10/28/22 0402 10/29/22 0426 10/30/22 0531 10/31/22 0228 11/01/22 0325 11/02/22 0240  WBC 23.0* 29.8* 25.1* 23.8* 21.4* 21.3* 20.2*  NEUTROABS 17.8* 24.2*  --   --   --   --   --   HGB 9.0* 9.8* 9.5* 9.5* 9.4* 9.6* 8.9*  HCT 26.7* 29.8* 28.9* 28.9* 28.8* 30.0* 27.3*  MCV 89.3 92.3 90.6 92.6 92.0 94.9 93.8  PLT 463* 355 351 350 320 313 296    Cardiac Enzymes: No results for input(s): "CKTOTAL", "CKMB", "CKMBINDEX", "TROPONINI" in the last 168 hours.  BNP: Invalid input(s): "POCBNP"  CBG: Recent Labs  Lab 10/28/22 0810 10/28/22 1648 10/30/22 0832 10/31/22 0822  GLUCAP 89 111* 86 88    Microbiology: Results for orders placed or performed during the hospital encounter of 10/13/22  SARS Coronavirus 2 by RT PCR (hospital order, performed in [facility] hospital lab) *cepheid single result test* Anterior Nasal Swab     Status: None   Collection Time: 10/13/22  3:55 PM   Specimen: Anterior Nasal Swab  Result Value Ref  Range Status   SARS Coronavirus 2 by RT PCR NEGATIVE NEGATIVE Final    Comment: (NOTE) SARS-CoV-2 target nucleic acids are NOT DETECTED.  The SARS-CoV-2 RNA is generally detectable in upper and lower respiratory specimens during the acute phase of infection. The lowest concentration of SARS-CoV-2 viral copies this assay can detect is 250 copies / mL. A negative result does not preclude SARS-CoV-2 infection and should not be used as the sole basis for treatment or other patient management decisions.  A negative result may occur with improper specimen collection / handling, submission of specimen other than nasopharyngeal swab, presence of viral mutation(s) within the areas targeted by this assay, and inadequate number of viral copies (<250 copies / mL). A negative result must be combined with clinical observations, patient history, and epidemiological information.  Fact Sheet  for Patients:   RoadLapTop.co.za  Fact Sheet for Healthcare Providers: http://kim-miller.com/  This test is not yet approved or  cleared by the [place] FDA and has been authorized for detection and/or diagnosis of SARS-CoV-2 by FDA under an Emergency Use Authorization (EUA).  This EUA will remain in effect (meaning this test can be used) for the duration of the COVID-19 declaration under Section 564(b)(1) of the Act, 21 U.S.C. section 360bbb-3(b)(1), unless the authorization is terminated or revoked sooner.  Performed at [facility], [address] [place], [place] [address]   MRSA Next Gen by PCR, Nasal     Status: None   Collection Time: 10/13/22  9:51 PM   Specimen: Nasal Mucosa; Nasal Swab  Result Value Ref Range Status   MRSA by PCR Next Gen NOT DETECTED NOT DETECTED Final    Comment: (NOTE) The GeneXpert MRSA Assay (FDA approved for NASAL specimens only), is one component of a comprehensive MRSA colonization surveillance program. It is not intended to diagnose MRSA infection nor to guide or monitor treatment for MRSA infections. Test performance is not FDA approved in patients less than 66 years old. Performed at [facility], [address], [place], [place] [address]   Urine Culture     Status: Abnormal   Collection Time: 10/14/22  7:32 AM   Specimen: Urine, Random  Result Value Ref Range Status   Specimen Description   Final    URINE, RANDOM Performed at [facility], [address] [place], [place] [address]    Special Requests   Final    NONE Reflexed from [id number] Performed at [facility], [address] [place], [place] [address]    Culture >=100,000 COLONIES/mL KLEBSIELLA PNEUMONIAE (A)  Final   Report Status 10/16/2022 FINAL  Final   Organism ID, Bacteria KLEBSIELLA PNEUMONIAE (A)  Final      Susceptibility   Klebsiella pneumoniae - MIC*    AMPICILLIN >=32  RESISTANT Resistant     CEFAZOLIN <=4 SENSITIVE Sensitive     CEFEPIME <=0.12 SENSITIVE Sensitive     CEFTRIAXONE <=0.25 SENSITIVE Sensitive     CIPROFLOXACIN <=0.25 SENSITIVE Sensitive     GENTAMICIN <=1 SENSITIVE Sensitive     IMIPENEM <=0.25 SENSITIVE Sensitive     NITROFURANTOIN 128 RESISTANT Resistant     TRIMETH/SULFA <=20 SENSITIVE Sensitive     AMPICILLIN/SULBACTAM 8 SENSITIVE Sensitive     PIP/TAZO <=4 SENSITIVE Sensitive     * >=100,000 COLONIES/mL KLEBSIELLA PNEUMONIAE  Culture, blood (Routine X 2) w Reflex to ID Panel     Status: None   Collection Time: 10/15/22  1:29 AM   Specimen: Right Antecubital;  Blood  Result Value Ref Range Status   Specimen Description RIGHT ANTECUBITAL  Final   Special Requests   Final    BOTTLES DRAWN AEROBIC AND ANAEROBIC Blood Culture adequate volume   Culture   Final    NO GROWTH 5 DAYS Performed at [facility], [address] [place], [place] [address]    Report Status 10/20/2022 FINAL  Final  Culture, blood (Routine X 2) w Reflex to ID Panel     Status: None   Collection Time: 10/15/22 11:41 AM   Specimen: BLOOD  Result Value Ref Range Status   Specimen Description BLOOD BLOOD RIGHT HAND  Final   Special Requests   Final    BOTTLES DRAWN AEROBIC ONLY Blood Culture results may not be optimal due to an inadequate volume of blood received in culture bottles   Culture   Final    NO GROWTH 5 DAYS Performed at [facility], [address], [place], [place] [address]    Report Status 10/20/2022 FINAL  Final  Body fluid culture w Gram Stain     Status: None   Collection Time: 10/17/22  2:02 PM   Specimen: Synovium; Body Fluid  Result Value Ref Range Status   Specimen Description   Final    SYNOVIAL Performed at [facility], [address] [place], [place] [address]    Special Requests   Final    Normal Performed at [facility], [address] [place], [place] [address]    Gram Stain    Final    FEW WBC PRESENT,BOTH PMN AND MONONUCLEAR NO ORGANISMS SEEN    Culture   Final    NO GROWTH 3 DAYS Performed at [facility], [address] [place], [place] [address]    Report Status 10/21/2022 FINAL  Final    Coagulation Studies: No results for input(s): "LABPROT", "INR" in the last 72 hours.  Urinalysis: No results for input(s): "COLORURINE", "LABSPEC", "PHURINE", "GLUCOSEU", "HGBUR", "BILIRUBINUR", "KETONESUR", "PROTEINUR", "UROBILINOGEN", "NITRITE", "LEUKOCYTESUR" in the last 72 hours.  Invalid input(s): "APPERANCEUR"    Imaging: No results found.   Medications:    sodium chloride     calcium gluconate     sodium chloride      (feeding supplement) PROSource Plus  30 mL Oral BID BM   vitamin C  500 mg Oral BID   Chlorhexidine Gluconate Cloth  6 each Topical Q0600   cyanocobalamin  1,000 mcg Oral Daily   insulin aspart  10 Units Intravenous Once   And   dextrose  1 ampule Intravenous Once   feeding supplement (NEPRO CARB STEADY)  237 mL Oral TID BM   folic acid  1 mg Oral Daily   heparin injection (subcutaneous)  5,000 Units Subcutaneous Q8H   multivitamin  1 tablet Oral QHS   nicotine  14 mg Transdermal Daily   thiamine  100 mg Oral Daily   Or   thiamine  100 mg Intravenous Daily   traMADol  50 mg Oral Once   Vitamin D (Ergocalciferol)  50,000 Units Oral Q7 days   acetaminophen, chlorproMAZINE, heparin, HYDROcodone-acetaminophen, melatonin, ondansetron **OR** ondansetron (ZOFRAN) IV, mouth rinse, polyethylene glycol, prochlorperazine, traZODone  Assessment/ Plan:  Mr. Franz Aseltine is a 72 y.o.  male with tobacco use, alcohol abuse, osteoarthritis status post left knee replacement and hyperlipidemia, who is admitted on 10/13/2022 for AKI (acute kidney injury) (HCC) [N17.9]  Acute Kidney Injury on chronic kidney disease stage IIIB: Baseline  creatinine of 1.74, GFR of 41 on 08/18/22. Suspect patient's acute kidney injury secondary to NSAID  induced nephropathy and concurrent sepsis. No history of diabetes or hypertension per patient. Renal ultrasound with no obstruction. No IV contrast. -Underwent first hemodialysis treatment on 10/15/2022, first series lasted until 10/21/22.  Then resumed hemodialysis on 9/10 and 9/12.  -Creatinine appears to have stabilized for now.  - Vascular consulted to remove permcath - Will continue to follow patient in office at discharge  2. Urinary tract infection with sepsis: Klebsiella species.  - Completed all therapies  3.  Anemia with renal failure: hemoglobin 8.9. Was prescribed an ESA with dialysis  4. 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

## 2022-11-02 NOTE — Progress Notes (Signed)
PROGRESS NOTE    Joshua Mora  GLO:[id number] DOB: 02-29-1950 DOA: 10/13/2022 PCP: Sandrea Hughs, NP  145A/145A-AA  LOS: 20 days   Brief hospital course:   Assessment & Plan: [name] is a 72 y.o. male with medical history significant of alcohol abuse, tobacco use disorder, diet controlled prediabetes, CKD 3B, osteoarthritis s/p left knee replacement who presented to the ED with complaints of progressive weakness and frequent falls over the past 1 month.     On presentation he was found to have severely elevated creatinine above baseline for which nephrology was consulted.  Also with significant leukocytosis above 46,000 with concern for leukemoid reaction in the setting of presumptive UTI.  Trauma imaging were nonacute.  CT head also nonacute.  Hospital course was complicated by acute hypoxic respiratory failure.  Was found to have mild pulmonary edema on chest x-ray.  Elevated D-dimer above 11 with concern for possible pulmonary embolism.  Heparin drip initiated, d/c'ed when V/Q scan and DVT neg.  * Acute renal failure (HCC) CKD 3b --Baseline creatinine of 1.74, GFR of 41 on 08/18/22. Suspect AKI secondary to NSAID induced nephropathy and concurrent sepsis. No history of diabetes or hypertension per patient. Renal ultrasound with no obstruction. No IV contrast.   -Underwent first hemodialysis treatment on 10/15/2022, first series lasted until 10/21/22.  Then resumed hemodialysis on 9/10 and 9/12.  --PermCath placed on 9/13.   --dialysis has been held, pt with stable labs and mental status. Plan: --cont to hold dialysis and monitor for renal recovery, per nephro   Severe sepsis [facility]) Patient met sepsis criteria with tachycardia, severe leukocytosis and severe sepsis with lactic acidosis above 4.3.  Initial procalcitonin was 60.67 which is decreasing. Did develop a low-grade fever overnight again.  Procalcitonin improving.  Preliminary blood cultures negative, urine  cultures with Klebsiella pneumonia which shows only resistance to ampicillin and Augmentin.  Patient did received ceftriaxone for the past 3 days.  Vancomycin was added on 10/16/2022.  Left knee aspirate with no growth so far. -ID consult -Completed the course of antibiotic and ID is advising monitoring without antibiotic for now.   Leukocytosis Slowly improving, pathologist smear review with immature granulocytes, initial smear review with bandemia.  Urine cultures with Klebsiella pneumonia but patient is being treated with appropriate antibiotics. Significantly elevated D-dimer which continued to get worse.  VTE has been ruled out with negative VQ scan and lower extremity venous Doppler. -Completed the course of antibiotic -Hematology consult-ordered some more labs, likely leukemoid reaction with severe inflammation and infection but due to persistent elevation patient underwent bone marrow biopsy on 9/10-showed reactive changes. Cytogenetics and myeloid NGS is pending.  Results discussed with pt and daughter by Dr. Alena Bills.   Elevated d-dimer Patient with worsening D-dimer, now at 13.  COVID was negative. DVT has been ruled out with negative VQ scan and lower extremity venous Doppler.   Acute hypoxemic respiratory failure (HCC) Patient did develop acute hypoxic respiratory failure and repeat chest x-ray at that time with concern of pulmonary congestion.  Echocardiogram with normal EF and grade 1 diastolic dysfunction.  VQ scan negative for PE Patient with significant AKI, also concern of NSAID induced renal injury. Started on dialysis. --weaned down on room air  Frequent falls Patient with history of recent decline, poor appetite and multiple falls.  First fall was on July 4 and since then he was falling couple of times a week.  Left knee imaging done on 8/23 with a concern of nondisplaced  fibular neck fracture, Initial plan was outpatient follow-up with orthopedic surgery. Requested  orthopedic surgery to evaluate while he is in hospital B12 normal, significantly low vitamin D-started on supplement. --SNF rehab   Nicotine dependence, uncomplicated -Nicotine patch   Traumatic rhabdomyolysis (HCC) Likely secondary to recurrent falls.   Hyperkalemia --s/p Lokelma and Veltassa.  Now resolved   Left ankle pain Patient continued to have worsening pain and edema of left ankle prompted an MRI of left ankle as initial imaging was negative for any fracture. MRI with complete tear of talofibular ligament and multiple other ligamental injuries. -Podiatry was consulted-recommending conservative management with pain control and CAM boot for 4 to 6 weeks and outpatient follow-up --cont Norco PRN    DVT prophylaxis: Heparin SQ Code Status: Full code  Family Communication: daughter updated at bedside today Level of care: Telemetry Medical Dispo:   The patient is from: home Anticipated d/c is to: SNF rehab Anticipated d/c date is: whenever bed available   Subjective and Interval History:  Pt reported Norco worked better for his left ankle pain.     Objective: Vitals:   11/01/22 2353 11/02/22 0500 11/02/22 0829 11/02/22 1543  BP: (!) 114/50  114/62 115/71  Pulse: 73  99 (!) 105  Resp: 18  16 15   Temp: 98 F (36.7 C)  98 F (36.7 C) 98 F (36.7 C)  TempSrc:      SpO2: 98%  98% 98%  Weight:  79.6 kg    Height:        Intake/Output Summary (Last 24 hours) at 11/02/2022 1950 Last data filed at 11/02/2022 0723 Gross per 24 hour  Intake --  Output 200 ml  Net -200 ml   Filed Weights   10/29/22 0500 10/31/22 0500 11/02/22 0500  Weight: 81.3 kg 82 kg 79.6 kg    Examination:   Constitutional: NAD, AAOx3 HEENT: conjunctivae and lids normal, EOMI CV: No cyanosis.   RESP: normal respiratory effort, on RA Extremities: both feet in soft boots Neuro: II - XII grossly intact.   Psych: flat mood and affect.     Data Reviewed: I have personally reviewed labs and  imaging studies  Time spent: 25 minutes  Darlin Priestly, MD [facility] If 7PM-7AM, please contact night-coverage 11/02/2022, 7:50 PM

## 2022-11-02 NOTE — TOC Progression Note (Signed)
Transition of Care [facility]) - Progression Note    Patient Details  Name: Joshua Mora MRN: [id number] Date of Birth: Apr 03, 1950  Transition of Care [facility]) CM/SW Contact  Marlowe Sax, RN Phone Number: 11/02/2022, 9:18 AM  Clinical Narrative:     Spoke with the patient in the room, he asked me to call his daughter [name], I called and spoke to her and reviewed the bed offers, she is going to have her sister go to [facility] and view them or go herself today, she will let me know shortly after lunch which facility they would like to go to STR at, I explained I Would need to know which facility he will go to in order to get Insurance process started, I made sure she had my contact information  Expected Discharge Plan: Skilled Nursing Facility Barriers to Discharge: Continued Medical Work up  Expected Discharge Plan and Services     Post Acute Care Choice: Home Health, Skilled Nursing Facility Living arrangements for the past 2 months: Single Family Home                                       Social Determinants of Health (SDOH) Interventions SDOH Screenings   Food Insecurity: No Food Insecurity (10/13/2022)  Housing: Low Risk  (10/13/2022)  Transportation Needs: No Transportation Needs (10/13/2022)  Utilities: Not At Risk (10/13/2022)  Tobacco Use: High Risk (10/13/2022)    Readmission Risk Interventions    11/02/2022    9:06 AM  Readmission Risk Prevention Plan  Transportation Screening Complete  PCP or Specialist Appt within 3-5 Days Complete  HRI or Home Care Consult Complete  Social Work Consult for Recovery Care Planning/Counseling Complete  Palliative Care Screening Not Applicable  Medication Review Oceanographer) Referral to Pharmacy

## 2022-11-02 NOTE — Progress Notes (Signed)
Palliative Care Progress Note   Patient Name: Joshua Mora       Date: 11/02/2022 DOB: 07-20-1950  Age: 72 y.o. MRN#: [id number] Attending Physician: Darlin Priestly, MD Primary Care Physician: Sandrea Hughs, NP Admit Date: 10/13/2022  Chart reviewed.  Patient assessed.  He is asleep, awakens easily, and quickly returns to sleep.  When asked if I could speak with him regards to plan of care, patient shook his head, did not open his eyes, and remained asleep.  PMT remains available to patient throughout his hospitalization and will follow-up with patient at a later date to continue goals of care discussions.  Thank you for allowing the Palliative Medicine Team to assist in the care of Wynne Critchfield.  Samara Deist L. Bonita Quin, DNP, FNP-BC Palliative Medicine Team  No charge

## 2022-11-02 NOTE — Progress Notes (Signed)
Physical Therapy Treatment Patient Details Name: Joshua Mora MRN: [id number] DOB: August 31, 1950 Today's Date: 11/02/2022   History of Present Illness Pt is a 72 year old presenting to the ED with progressive weakness and frequent falls; admitted with AKI, leukocytosis  PMH significant for osteoarthritis s/p left knee replacement. Pt found to have L fibular head fx. Now with multiple L ankle ligament injuries for which CAM boot is recommended with mobility    PT Comments  Pt found supine in bed with family in room. He was willing to work with therapy today and put forth fair effort throughout the session. Pt continues to be Max A +2 at best for bed mobility, though is able to initiate LE movement towards EOB when cued. Once seated upright, pt able to balance with consistent BUE support, but no physical assist from PT. Pt performed x2 STS trials requiring consistent Mod A +2 to maintain, able to stand further upright with various postural cues. Pt only able to stand for ~1 min during each trial before needing to sit down secondary to fatigue. When standing, pt unable to shuffle or take steps despite cues to do so. Pt will benefit from continued PT services upon discharge to safely address deficits listed in patient problem list for decreased caregiver assistance and eventual return to PLOF.    If plan is discharge home, recommend the following: Two people to help with walking and/or transfers;Two people to help with bathing/dressing/bathroom;Help with stairs or ramp for entrance;Assist for transportation   Can travel by private vehicle     No  Equipment Recommendations  Other (comment) (TBD at next venue of care)    Recommendations for Other Services       Precautions / Restrictions Precautions Precautions: Fall Required Braces or Orthoses: Other Brace Other Brace: CAM boot off while in bed, prevalons in bed. CAM boot when up Restrictions Weight Bearing Restrictions: Yes LLE Weight  Bearing: Weight bearing as tolerated Other Position/Activity Restrictions: with CAM walker in place     Mobility  Bed Mobility Overal bed mobility: Needs Assistance Bed Mobility: Supine to Sit, Sit to Supine Rolling: Max assist   Supine to sit: Max assist, +2 for physical assistance Sit to supine: +2 for physical assistance, Max assist, Total assist   General bed mobility comments: Pt able to slide BIL feet over slightly towards EOB, still requiring MAX A for LE and trunk management    Transfers Overall transfer level: Needs assistance Equipment used: Rolling walker (2 wheels) Transfers: Sit to/from Stand Sit to Stand: Mod assist, +2 physical assistance           General transfer comment: Pt able to perform STS x2, with pt unable to take steps to eighter side    Ambulation/Gait                   Stairs             Wheelchair Mobility     Tilt Bed    Modified Rankin (Stroke Patients Only)       Balance Overall balance assessment: Needs assistance Sitting-balance support: Bilateral upper extremity supported, Feet supported Sitting balance-Leahy Scale: Poor Sitting balance - Comments: needs  consistent UE assist on bed or bed rail for support, no physical supp needed from PT   Standing balance support: Bilateral upper extremity supported, During functional activity Standing balance-Leahy Scale: Poor Standing balance comment: Pt needing consistent mod A +2 with RW and several postural cues to maintain standing  balance                            Cognition Arousal: Alert Behavior During Therapy: WFL for tasks assessed/performed Overall Cognitive Status: Within Functional Limits for tasks assessed                                          Exercises Total Joint Exercises Long Arc Quad: AROM, AAROM, Both, 5 reps (Pt able to activate muscles and perform with intermitted assist, primarily with CAM boot management)     General Comments        Pertinent Vitals/Pain Pain Assessment Pain Assessment: No/denies pain Pain Score: 10-Worst pain ever Pain Location: L foot/ankle Pain Descriptors / Indicators: Aching, Moaning, Grimacing, Guarding Pain Intervention(s): Monitored during session, Limited activity within patient's tolerance    Home Living                          Prior Function            PT Goals (current goals can now be found in the care plan section) Progress towards PT goals: Progressing toward goals;Goals updated    Frequency    Min 1X/week      PT Plan      Co-evaluation              AM-PAC PT "6 Clicks" Mobility   Outcome Measure  Help needed turning from your back to your side while in a flat bed without using bedrails?: A Lot Help needed moving from lying on your back to sitting on the side of a flat bed without using bedrails?: A Lot Help needed moving to and from a bed to a chair (including a wheelchair)?: Total Help needed standing up from a chair using your arms (e.g., wheelchair or bedside chair)?: Total Help needed to walk in hospital room?: Total Help needed climbing 3-5 steps with a railing? : Total 6 Click Score: 8    End of Session Equipment Utilized During Treatment: Gait belt (CAM boot on LLE) Activity Tolerance: Patient limited by pain;Patient limited by fatigue Patient left: in bed;with call bell/phone within reach;with bed alarm set Nurse Communication: Mobility status PT Visit Diagnosis: Muscle weakness (generalized) (M62.81);Repeated falls (R29.6);Difficulty in walking, not elsewhere classified (R26.2)     Time: 5784-6962 PT Time Calculation (min) (ACUTE ONLY): 32 min  Charges:                            Cecile Sheerer, SPT 11/02/22, 5:03 PM

## 2022-11-03 ENCOUNTER — Encounter
Admission: EM | Disposition: A | Discharge status: Skilled Nursing Facility | Payer: Self-pay | Source: Home / Self Care | Attending: Internal Medicine

## 2022-11-03 DIAGNOSIS — D72829 Elevated white blood cell count, unspecified: Secondary | ICD-10-CM | POA: Diagnosis not present

## 2022-11-03 DIAGNOSIS — Z515 Encounter for palliative care: Secondary | ICD-10-CM | POA: Diagnosis not present

## 2022-11-03 DIAGNOSIS — N179 Acute kidney failure, unspecified: Secondary | ICD-10-CM | POA: Diagnosis not present

## 2022-11-03 DIAGNOSIS — Z452 Encounter for adjustment and management of vascular access device: Secondary | ICD-10-CM | POA: Diagnosis not present

## 2022-11-03 HISTORY — PX: DIALYSIS/PERMA CATHETER REMOVAL: CATH118289

## 2022-11-03 LAB — BASIC METABOLIC PANEL
Anion gap: 10 (ref 5–15)
BUN: 50 mg/dL — ABNORMAL HIGH (ref 8–23)
CO2: 23 mmol/L (ref 22–32)
Calcium: 8.8 mg/dL — ABNORMAL LOW (ref 8.9–10.3)
Chloride: 104 mmol/L (ref 98–111)
Creatinine, Ser: 2.6 mg/dL — ABNORMAL HIGH (ref 0.61–1.24)
GFR, Estimated: 25 mL/min — ABNORMAL LOW (ref >60)
Glucose, Bld: 92 mg/dL (ref 70–99)
Potassium: 4.9 mmol/L (ref 3.5–5.1)
Sodium: 137 mmol/L (ref 135–145)

## 2022-11-03 SURGERY — DIALYSIS/PERMA CATHETER REMOVAL
Anesthesia: LOCAL

## 2022-11-03 MED ORDER — HYDROCODONE-ACETAMINOPHEN 5-325 MG PO TABS
1.0000 | ORAL_TABLET | Freq: Three times a day (TID) | ORAL | Status: DC
  Administered 2022-11-03 – 2022-11-04 (×3): 2 via ORAL
  Filled 2022-11-03 (×3): qty 2

## 2022-11-03 MED ORDER — LIDOCAINE-EPINEPHRINE (PF) 1 %-1:200000 IJ SOLN
INTRAMUSCULAR | Status: DC | PRN
  Administered 2022-11-03: 10 mL via INTRADERMAL

## 2022-11-03 SURGICAL SUPPLY — 3 items
FORCEPS HALSTEAD CVD 5IN STRL (INSTRUMENTS) IMPLANT
SCALPEL PROTECTED #11 DISP (BLADE) IMPLANT
TRAY LACERAT/PLASTIC (MISCELLANEOUS) IMPLANT

## 2022-11-03 NOTE — Progress Notes (Signed)
Palliative Care Progress Note, Assessment & Plan   Patient Name: Joshua Mora       Date: 11/03/2022 DOB: 11/15/1950  Age: 72 y.o. MRN#: [id number] Attending Physician: Joshua Priestly, MD Primary Care Physician: Joshua Hughs, NP Admit Date: 10/13/2022  Subjective: Patient is sitting up in bed in no apparent distress.  He acknowledges my presence and is able to make his wishes known.  His daughter Joshua Mora is at bedside during my visit.  HPI: Joshua Mora is a 72 y.o. male with medical history significant of alcohol abuse, tobacco use disorder, diet controlled prediabetes, CKD 3B, osteoarthritis s/p left knee replacement who presented to the ED with complaints of progressive weakness and frequent falls over the past 1 month.     On presentation he was found to have severely elevated creatinine above baseline for which nephrology was consulted.  Also with significant leukocytosis above 46,000 with concern for leukemoid reaction in the setting of presumptive UTI.  Trauma imaging were nonacute.  CT head also nonacute.  Hospital course was complicated by acute hypoxic respiratory failure.  Was found to have mild pulmonary edema on chest x-ray.  Elevated D-dimer above 11 with concern for possible pulmonary embolism.  Heparin drip initiated, d/c'ed when V/Q scan and DVT neg.   Patient is being treated for acute renal failure - now on hemodialysis and left ankle pain.   PMT was consulted to discuss goals of care.  Summary of counseling/coordination of care: Extensive chart review completed prior to meeting patient including labs, vital signs, imaging, progress notes, orders, and available advanced directive documents from current and previous encounters.   After reviewing the patient's chart and assessing the  patient at bedside, I spoke with patient and his daughter in regards to plan and goals of care.  Symptoms assessed.  Patient denies any acute complaints at this time.  He endorses long periods of time between when he asked for pain medication when it was given.  We discussed that medication can be scheduled and brought on a routine/regular basis.  Discussed importance of baseline coverage of patient's pain control.  Also reviewed that should patient not need the pain medication he can always decline it.  Adjustment made tomorrow so patient has medication offered 3 times daily.    I attempted to elicit values and goals important to the patient.  Patient shares he is open to going to rehab but would like to see what his options are.  He again reiterated that he would like to eventually be able to return home but understands there are some barriers at play.  I highlighted our conversation earlier and reiterated that importance of making his wishes known not only to the medical team but to his daughter/HCPOA.  CODE STATUS again discussed.  Patient endorses that he would be accepting of all offered, available, and appropriate medical interventions to sustain his life.  However, he would never want to live long-term on a ventilator and would never be accepting of a PEG tube.  Full code and full scope remain.  Daughter at bedside remains in agreement.  Opportunity and space given for patient and daughter to ask questions.  Goals clear.  Plan set.  Symptom burden remains low.  Patient/family have PMT contact information and were encouraged to contact PMT with any future acute palliative needs.   PMT will monitor the patient peripherally and shadow his chart. Please re-engage with PMT if goals change, at patient/family's request, or if patient's health deteriorates during hospitalization.    Physical Exam Vitals reviewed.  Constitutional:      Appearance: He is normal weight.  HENT:     Head:  Normocephalic.     Mouth/Throat:     Mouth: Mucous membranes are moist.  Eyes:     Pupils: Pupils are equal, round, and reactive to light.  Pulmonary:     Effort: Pulmonary effort is normal.  Abdominal:     Palpations: Abdomen is soft.  Skin:    General: Skin is warm and dry.     Coloration: Skin is pale.  Neurological:     Mental Status: He is alert and oriented to person, place, and time.  Psychiatric:        Mood and Affect: Mood normal.        Behavior: Behavior normal.        Thought Content: Thought content normal.        Judgment: Judgment normal.             Time spent includes: Detailed review of medical records (labs, imaging, vital signs), medically appropriate exam (mental status, respiratory, cardiac, skin), discussed with treatment team, counseling and educating patient, family and staff, documenting clinical information, medication management and coordination of care.  Total Time 50 minutes   Joshua Mora L. Bonita Quin, DNP, FNP-BC Palliative Medicine Team

## 2022-11-03 NOTE — Progress Notes (Signed)
PROGRESS NOTE    Joshua Mora  FIE:[id number] DOB: [birth year] DOA: 10/13/2022 PCP: Sandrea Hughs, NP  145A/145A-AA  LOS: 21 days   Brief hospital course:   Assessment & Plan: Joshua Mora is a 72 y.o. male with medical history significant of alcohol abuse, tobacco use disorder, diet controlled prediabetes, CKD 3B, osteoarthritis s/p left knee replacement who presented to the ED with complaints of progressive weakness and frequent falls over the past 1 month.     On presentation he was found to have severely elevated creatinine above baseline for which nephrology was consulted.  Also with significant leukocytosis above 46,000 with concern for leukemoid reaction in the setting of presumptive UTI.  Trauma imaging were nonacute.  CT head also nonacute.  Hospital course was complicated by acute hypoxic respiratory failure.  Was found to have mild pulmonary edema on chest x-ray.  Elevated D-dimer above 11 with concern for possible pulmonary embolism.  Heparin drip initiated, d/c'ed when V/Q scan and DVT neg.  * Acute renal failure (HCC) CKD 3b --Baseline creatinine of 1.74, GFR of 41 on 08/18/22. Suspect AKI secondary to NSAID induced nephropathy and concurrent sepsis. No history of diabetes or hypertension per patient. Renal ultrasound with no obstruction. No IV contrast.   -Underwent first hemodialysis treatment on 10/15/2022, first series lasted until 10/21/22.  Then resumed hemodialysis on 9/10 and 9/12.  --PermCath placed on 9/13.   --dialysis has been held, pt with stable labs and mental status.  Renal function recovered and now no longer needs dialysis, per nephro. --PermCath removed today.   Severe sepsis [facility]) Patient met sepsis criteria with tachycardia, severe leukocytosis and severe sepsis with lactic acidosis above 4.3.  Initial procalcitonin was 60.67 which is decreasing. Did develop a low-grade fever again.  Procalcitonin improving.  Preliminary blood cultures negative,  urine cultures with Klebsiella pneumonia which shows only resistance to ampicillin and Augmentin.  Patient did received ceftriaxone for the past 3 days.  Vancomycin was added on 10/16/2022.  Left knee aspirate with no growth so far. -ID consulted -Completed the course of antibiotic and ID is advising monitoring without antibiotic for now.   Leukocytosis Slowly improving, pathologist smear review with immature granulocytes, initial smear review with bandemia.  Urine cultures with Klebsiella pneumonia but patient is being treated with appropriate antibiotics. Significantly elevated D-dimer which continued to get worse.  VTE has been ruled out with negative VQ scan and lower extremity venous Doppler. -Completed the course of antibiotic -Hematology consult-ordered some more labs, likely leukemoid reaction with severe inflammation and infection but due to persistent elevation patient underwent bone marrow biopsy on 9/10-showed reactive changes. Cytogenetics and myeloid NGS is pending.  Results discussed with pt and daughter by Dr. Alena Bills.   Elevated d-dimer Patient with worsening D-dimer, now at 13.  COVID was negative. DVT has been ruled out with negative VQ scan and lower extremity venous Doppler.   Acute hypoxemic respiratory failure (HCC) Patient did develop acute hypoxic respiratory failure and repeat chest x-ray at that time with concern of pulmonary congestion.  Echocardiogram with normal EF and grade 1 diastolic dysfunction.  VQ scan negative for PE Patient with significant AKI, also concern of NSAID induced renal injury. Started on dialysis. --weaned down on room air  Frequent falls Patient with history of recent decline, poor appetite and multiple falls.  First fall was on July 4 and since then he was falling couple of times a week.  Left knee imaging done on 8/23 with a concern  of nondisplaced fibular neck fracture, Initial plan was outpatient follow-up with orthopedic surgery. Requested  orthopedic surgery to evaluate while he is in hospital B12 normal, significantly low vitamin D-started on supplement. --SNF rehab   Nicotine dependence, uncomplicated -Nicotine patch   Traumatic rhabdomyolysis (HCC) Likely secondary to recurrent falls.   Hyperkalemia --s/p Lokelma and Veltassa.  Now resolved   Left ankle pain Patient continued to have worsening pain and edema of left ankle prompted an MRI of left ankle as initial imaging was negative for any fracture. MRI with complete tear of talofibular ligament and multiple other ligamental injuries. -Podiatry was consulted-recommending conservative management with pain control and CAM boot for 4 to 6 weeks and outpatient follow-up --cont Norco PRN    DVT prophylaxis: Heparin SQ Code Status: Full code  Family Communication:  Level of care: Telemetry Medical Dispo:   The patient is from: home Anticipated d/c is to: SNF rehab Anticipated d/c date is: whenever bed available   Subjective and Interval History:  Pt reported his left ankle felt better today.   Objective: Vitals:   11/03/22 0803 11/03/22 1427 11/03/22 1454 11/03/22 1517  BP: (!) 124/59 111/74 (!) 106/59 (!) 127/55  Pulse: 99 (!) 106 (!) 105 (!) 104  Resp: 16 18 14 18   Temp: 97.8 F (36.6 C) 97.9 F (36.6 C)  98.2 F (36.8 C)  TempSrc: Oral Oral    SpO2: 97% 95% 94% 97%  Weight:      Height:        Intake/Output Summary (Last 24 hours) at 11/03/2022 2015 Last data filed at 11/03/2022 1400 Gross per 24 hour  Intake --  Output 1050 ml  Net -1050 ml   Filed Weights   10/31/22 0500 11/02/22 0500 11/03/22 0436  Weight: 82 kg 79.6 kg 79.8 kg    Examination:   Constitutional: NAD, AAOx3 HEENT: conjunctivae and lids normal, EOMI CV: No cyanosis.   RESP: normal respiratory effort, on RA Extremities: both feet in soft boots SKIN: warm, dry Neuro: II - XII grossly intact.   Psych: Normal mood and affect.  Appropriate judgement and reason   Data  Reviewed: I have personally reviewed labs and imaging studies  Time spent: 25 minutes  Darlin Priestly, MD [facility] If 7PM-7AM, please contact night-coverage 11/03/2022, 8:15 PM

## 2022-11-03 NOTE — Plan of Care (Signed)

## 2022-11-03 NOTE — Care Management Important Message (Signed)
Important Message  Patient Details  Name: Joshua Mora MRN: [id number] Date of Birth: 25-Apr-1950   Medicare Important Message Given:  Yes  I reviewed the Important Message from Medicare with HCPOA, Audric Weidner by phone [phone]) and she stated she understood these rights. She had toured [facility] yesterday and wanted to accept that bed offer. I will place a note in the handoff column for the RN CM. I thanked her for her time and wished her a nice day.   Olegario Messier A Sharvil Hoey 11/03/2022, 10:48 AM

## 2022-11-03 NOTE — Op Note (Signed)
Operative Note     Preoperative diagnosis:   1.  Renal failure with return of renal function  Postoperative diagnosis:  1.  Same  Procedure:  Removal of right jugular Permcath  Surgeon:  Festus Barren, MD  Anesthesia:  Local  EBL:  Minimal  Indication for the Procedure:  The patient has had return of his renal function and no longer needs their permcath.  This can be removed.  Risks and benefits are discussed and informed consent is obtained.  Description of the Procedure:  The patient's right neck, chest and existing catheter were sterilely prepped and draped. The area around the catheter was anesthetized copiously with 1% lidocaine. The catheter was dissected out with curved hemostats until the cuff was freed from the surrounding fibrous sheath. The fiber sheath was transected, and the catheter was then removed in its entirety using gentle traction. Pressure was held and sterile dressings were placed. The patient tolerated the procedure well and was taken to the recovery room in stable condition.     Festus Barren  11/03/2022, 3:27 PM This note was created with Dragon Medical transcription system. Any errors in dictation are purely unintentional.

## 2022-11-03 NOTE — Progress Notes (Signed)
[facility]  ROUNDING NOTE   Subjective:   Patient seen sitting up in bed Alert, continues to be irritable  Creatinine 2.60 Urine output 1L  Objective:  Vital signs in last 24 hours:  Temp:  [97.8 F (36.6 C)-98 F (36.7 C)] 97.8 F (36.6 C) (09/19 0803) Pulse Rate:  [99-105] 99 (09/19 0803) Resp:  [15-20] 16 (09/19 0803) BP: (115-129)/(59-71) 124/59 (09/19 0803) SpO2:  [96 %-98 %] 97 % (09/19 0803) Weight:  [79.8 kg] 79.8 kg (09/19 0436)  Weight change: 0.2 kg Filed Weights   10/31/22 0500 11/02/22 0500 11/03/22 0436  Weight: 82 kg 79.6 kg 79.8 kg    Intake/Output: I/O last 3 completed shifts: In: 0  Out: 1050 [Urine:1050]   Intake/Output this shift:  No intake/output data recorded.  Physical Exam: General: NAD  Head: Normocephalic, atraumatic. Moist oral mucosal membranes  Eyes: Anicteric  Neck: Supple  Lungs:  Clear to auscultation,normal effort  Heart: Regular rate and rhythm  Abdomen:  Soft, nontender  Extremities:  No peripheral edema.  Neurologic: Alertl, moving all four extremities  Skin: No lesions  Access: RIJ permcath    Basic Metabolic Panel: Recent Labs  Lab 10/29/22 0426 10/30/22 0531 10/31/22 0228 11/01/22 0325 11/02/22 0240 11/03/22 0207  NA 137 136 137 137 137 137  K 4.2 4.0 4.1 4.2 4.4 4.9  CL 102 100 104 102 105 104  CO2 25 25 24 25 24 23   GLUCOSE 120* 105* 99 104* 96 92  BUN 65* 61* 53* 55* 54* 50*  CREATININE 2.75* 2.80* 2.57* 2.66* 2.72* 2.60*  CALCIUM 8.6* 8.7* 8.7* 8.7* 8.7* 8.8*  MG 2.2 2.2 2.1 2.2 2.2  --     Liver Function Tests: No results for input(s): "AST", "ALT", "ALKPHOS", "BILITOT", "PROT", "ALBUMIN" in the last 168 hours.  No results for input(s): "LIPASE", "AMYLASE" in the last 168 hours. No results for input(s): "AMMONIA" in the last 168 hours.  CBC: Recent Labs  Lab 10/28/22 0402 10/29/22 0426 10/30/22 0531 10/31/22 0228 11/01/22 0325 11/02/22 0240  WBC 29.8* 25.1* 23.8* 21.4*  21.3* 20.2*  NEUTROABS 24.2*  --   --   --   --   --   HGB 9.8* 9.5* 9.5* 9.4* 9.6* 8.9*  HCT 29.8* 28.9* 28.9* 28.8* 30.0* 27.3*  MCV 92.3 90.6 92.6 92.0 94.9 93.8  PLT 355 351 350 320 313 296    Cardiac Enzymes: No results for input(s): "CKTOTAL", "CKMB", "CKMBINDEX", "TROPONINI" in the last 168 hours.  BNP: Invalid input(s): "POCBNP"  CBG: Recent Labs  Lab 10/28/22 0810 10/28/22 1648 10/30/22 0832 10/31/22 0822  GLUCAP 89 111* 86 88    Microbiology: Results for orders placed or performed during the hospital encounter of 10/13/22  SARS Coronavirus 2 by RT PCR (hospital order, performed in [facility] hospital lab) *cepheid single result test* Anterior Nasal Swab     Status: None   Collection Time: 10/13/22  3:55 PM   Specimen: Anterior Nasal Swab  Result Value Ref Range Status   SARS Coronavirus 2 by RT PCR NEGATIVE NEGATIVE Final    Comment: (NOTE) SARS-CoV-2 target nucleic acids are NOT DETECTED.  The SARS-CoV-2 RNA is generally detectable in upper and lower respiratory specimens during the acute phase of infection. The lowest concentration of SARS-CoV-2 viral copies this assay can detect is 250 copies / mL. A negative result does not preclude SARS-CoV-2 infection and should not be used as the sole basis for treatment or other patient management decisions.  A negative result may occur with improper specimen collection / handling, submission of specimen other than nasopharyngeal swab, presence of viral mutation(s) within the areas targeted by this assay, and inadequate number of viral copies (<250 copies / mL). A negative result must be combined with clinical observations, patient history, and epidemiological information.  Fact Sheet for Patients:   RoadLapTop.co.za  Fact Sheet for Healthcare Providers: http://kim-miller.com/  This test is not yet approved or  cleared by the [place] FDA and has been authorized  for detection and/or diagnosis of SARS-CoV-2 by FDA under an Emergency Use Authorization (EUA).  This EUA will remain in effect (meaning this test can be used) for the duration of the COVID-19 declaration under Section 564(b)(1) of the Act, 21 U.S.C. section 360bbb-3(b)(1), unless the authorization is terminated or revoked sooner.  Performed at [facility], [address] [place], [place] [address]   MRSA Next Gen by PCR, Nasal     Status: None   Collection Time: 10/13/22  9:51 PM   Specimen: Nasal Mucosa; Nasal Swab  Result Value Ref Range Status   MRSA by PCR Next Gen NOT DETECTED NOT DETECTED Final    Comment: (NOTE) The GeneXpert MRSA Assay (FDA approved for NASAL specimens only), is one component of a comprehensive MRSA colonization surveillance program. It is not intended to diagnose MRSA infection nor to guide or monitor treatment for MRSA infections. Test performance is not FDA approved in patients less than 58 years old. Performed at [facility], [address], [place], [place] [address]   Urine Culture     Status: Abnormal   Collection Time: 10/14/22  7:32 AM   Specimen: Urine, Random  Result Value Ref Range Status   Specimen Description   Final    URINE, RANDOM Performed at [facility], [address] [place], [place] [address]    Special Requests   Final    NONE Reflexed from [phone] Performed at [facility], [address] [place], [place] [address]    Culture >=100,000 COLONIES/mL KLEBSIELLA PNEUMONIAE (A)  Final   Report Status 10/16/2022 FINAL  Final   Organism ID, Bacteria KLEBSIELLA PNEUMONIAE (A)  Final      Susceptibility   Klebsiella pneumoniae - MIC*    AMPICILLIN >=32 RESISTANT Resistant     CEFAZOLIN <=4 SENSITIVE Sensitive     CEFEPIME <=0.12 SENSITIVE Sensitive     CEFTRIAXONE <=0.25 SENSITIVE Sensitive     CIPROFLOXACIN <=0.25 SENSITIVE Sensitive     GENTAMICIN <=1 SENSITIVE Sensitive     IMIPENEM  <=0.25 SENSITIVE Sensitive     NITROFURANTOIN 128 RESISTANT Resistant     TRIMETH/SULFA <=20 SENSITIVE Sensitive     AMPICILLIN/SULBACTAM 8 SENSITIVE Sensitive     PIP/TAZO <=4 SENSITIVE Sensitive     * >=100,000 COLONIES/mL KLEBSIELLA PNEUMONIAE  Culture, blood (Routine X 2) w Reflex to ID Panel     Status: None   Collection Time: 10/15/22  1:29 AM   Specimen: Right Antecubital; Blood  Result Value Ref Range Status   Specimen Description RIGHT ANTECUBITAL  Final   Special Requests   Final    BOTTLES DRAWN AEROBIC AND ANAEROBIC Blood Culture adequate volume   Culture   Final    NO GROWTH 5 DAYS Performed at [facility], [address], [place], [place] [address]    Report Status 10/20/2022 FINAL  Final  Culture, blood (Routine X 2) w Reflex to ID Panel     Status: None  Collection Time: 10/15/22 11:41 AM   Specimen: BLOOD  Result Value Ref Range Status   Specimen Description BLOOD BLOOD RIGHT HAND  Final   Special Requests   Final    BOTTLES DRAWN AEROBIC ONLY Blood Culture results may not be optimal due to an inadequate volume of blood received in culture bottles   Culture   Final    NO GROWTH 5 DAYS Performed at [facility], [address], [place], [place] [address]    Report Status 10/20/2022 FINAL  Final  Body fluid culture w Gram Stain     Status: None   Collection Time: 10/17/22  2:02 PM   Specimen: Synovium; Body Fluid  Result Value Ref Range Status   Specimen Description   Final    SYNOVIAL Performed at [facility], [address] [place], [place] [address]    Special Requests   Final    Normal Performed at [facility], [address] [place], [place] [address]    Gram Stain   Final    FEW WBC PRESENT,BOTH PMN AND MONONUCLEAR NO ORGANISMS SEEN    Culture   Final    NO GROWTH 3 DAYS Performed at [facility], [address], [place], [place] [address]    Report Status 10/21/2022 FINAL  Final     Coagulation Studies: No results for input(s): "LABPROT", "INR" in the last 72 hours.  Urinalysis: No results for input(s): "COLORURINE", "LABSPEC", "PHURINE", "GLUCOSEU", "HGBUR", "BILIRUBINUR", "KETONESUR", "PROTEINUR", "UROBILINOGEN", "NITRITE", "LEUKOCYTESUR" in the last 72 hours.  Invalid input(s): "APPERANCEUR"    Imaging: No results found.   Medications:    sodium chloride     calcium gluconate     sodium chloride      (feeding supplement) PROSource Plus  30 mL Oral BID BM   vitamin C  500 mg Oral BID   Chlorhexidine Gluconate Cloth  6 each Topical Q0600   cyanocobalamin  1,000 mcg Oral Daily   insulin aspart  10 Units Intravenous Once   And   dextrose  1 ampule Intravenous Once   feeding supplement (NEPRO CARB STEADY)  237 mL Oral TID BM   folic acid  1 mg Oral Daily   heparin injection (subcutaneous)  5,000 Units Subcutaneous Q8H   multivitamin  1 tablet Oral QHS   nicotine  14 mg Transdermal Daily   thiamine  100 mg Oral Daily   Or   thiamine  100 mg Intravenous Daily   traMADol  50 mg Oral Once   Vitamin D (Ergocalciferol)  50,000 Units Oral Q7 days   acetaminophen, chlorproMAZINE, heparin, HYDROcodone-acetaminophen, melatonin, ondansetron **OR** ondansetron (ZOFRAN) IV, mouth rinse, polyethylene glycol, prochlorperazine, traZODone  Assessment/ Plan:  Mr. Ariz Lifsey is a 72 y.o.  male with tobacco use, alcohol abuse, osteoarthritis status post left knee replacement and hyperlipidemia, who is admitted on 10/13/2022 for AKI (acute kidney injury) (HCC) [N17.9]  Acute Kidney Injury on chronic kidney disease stage IIIB: Baseline creatinine of 1.74, GFR of 41 on 08/18/22. Suspect patient's acute kidney injury secondary to NSAID induced nephropathy and concurrent sepsis. No history of diabetes or hypertension per patient. Renal ultrasound with no obstruction. No IV contrast. -Underwent first hemodialysis treatment on 10/15/2022, first series lasted until  10/21/22.  Then resumed hemodialysis on 9/10 and 9/12.  -Creatinine stable with adequate urine output - Appreciate vascular removing permcath later today  2. Urinary tract infection with sepsis: Klebsiella species.  - Completed all therapies  3.  Anemia with renal failure: hemoglobin 8.9. Was prescribed an ESA with dialysis  4. 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

## 2022-11-04 ENCOUNTER — Encounter: Payer: Self-pay | Admitting: Vascular Surgery

## 2022-11-04 DIAGNOSIS — M25572 Pain in left ankle and joints of left foot: Secondary | ICD-10-CM

## 2022-11-04 DIAGNOSIS — F17210 Nicotine dependence, cigarettes, uncomplicated: Secondary | ICD-10-CM

## 2022-11-04 DIAGNOSIS — R531 Weakness: Secondary | ICD-10-CM | POA: Diagnosis not present

## 2022-11-04 DIAGNOSIS — A419 Sepsis, unspecified organism: Secondary | ICD-10-CM | POA: Diagnosis not present

## 2022-11-04 DIAGNOSIS — N179 Acute kidney failure, unspecified: Secondary | ICD-10-CM | POA: Diagnosis not present

## 2022-11-04 DIAGNOSIS — D72829 Elevated white blood cell count, unspecified: Secondary | ICD-10-CM | POA: Diagnosis not present

## 2022-11-04 LAB — RENAL FUNCTION PANEL
Albumin: 2.6 g/dL — ABNORMAL LOW (ref 3.5–5.0)
Anion gap: 12 (ref 5–15)
BUN: 46 mg/dL — ABNORMAL HIGH (ref 8–23)
CO2: 22 mmol/L (ref 22–32)
Calcium: 9 mg/dL (ref 8.9–10.3)
Chloride: 103 mmol/L (ref 98–111)
Creatinine, Ser: 2.64 mg/dL — ABNORMAL HIGH (ref 0.61–1.24)
GFR, Estimated: 25 mL/min — ABNORMAL LOW (ref >60)
Glucose, Bld: 155 mg/dL — ABNORMAL HIGH (ref 70–99)
Phosphorus: 4 mg/dL (ref 2.5–4.6)
Potassium: 4.9 mmol/L (ref 3.5–5.1)
Sodium: 137 mmol/L (ref 135–145)

## 2022-11-04 LAB — CBC
HCT: 28.3 % — ABNORMAL LOW (ref 39.0–52.0)
HCT: 29.6 % — ABNORMAL LOW (ref 39.0–52.0)
Hemoglobin: 9.2 g/dL — ABNORMAL LOW (ref 13.0–17.0)
Hemoglobin: 9.5 g/dL — ABNORMAL LOW (ref 13.0–17.0)
MCH: 29.9 pg (ref 26.0–34.0)
MCH: 30.2 pg (ref 26.0–34.0)
MCHC: 32.5 g/dL (ref 30.0–36.0)
MCHC: 32.1 g/dL (ref 30.0–36.0)
MCV: 91.9 fL (ref 80.0–100.0)
MCV: 94 fL (ref 80.0–100.0)
Platelets: 306 10*3/uL (ref 150–400)
Platelets: 326 10*3/uL (ref 150–400)
RBC: 3.08 MIL/uL — ABNORMAL LOW (ref 4.22–5.81)
RBC: 3.15 MIL/uL — ABNORMAL LOW (ref 4.22–5.81)
RDW: 16.9 % — ABNORMAL HIGH (ref 11.5–15.5)
RDW: 17.1 % — ABNORMAL HIGH (ref 11.5–15.5)
WBC: 17.8 10*3/uL — ABNORMAL HIGH (ref 4.0–10.5)
WBC: 18.5 10*3/uL — ABNORMAL HIGH (ref 4.0–10.5)
nRBC: 0 % (ref 0.0–0.2)
nRBC: 0 % (ref 0.0–0.2)

## 2022-11-04 LAB — BASIC METABOLIC PANEL
Anion gap: 8 (ref 5–15)
BUN: 48 mg/dL — ABNORMAL HIGH (ref 8–23)
CO2: 23 mmol/L (ref 22–32)
Calcium: 8.6 mg/dL — ABNORMAL LOW (ref 8.9–10.3)
Chloride: 105 mmol/L (ref 98–111)
Creatinine, Ser: 2.6 mg/dL — ABNORMAL HIGH (ref 0.61–1.24)
GFR, Estimated: 25 mL/min — ABNORMAL LOW (ref >60)
Glucose, Bld: 90 mg/dL (ref 70–99)
Potassium: 4.8 mmol/L (ref 3.5–5.1)
Sodium: 136 mmol/L (ref 135–145)

## 2022-11-04 LAB — GLUCOSE, CAPILLARY: Glucose-Capillary: 91 mg/dL (ref 70–99)

## 2022-11-04 LAB — MAGNESIUM: Magnesium: 2.1 mg/dL (ref 1.7–2.4)

## 2022-11-04 MED ORDER — ADULT MULTIVITAMIN W/MINERALS CH
1.0000 | ORAL_TABLET | Freq: Every day | ORAL | Status: DC
  Administered 2022-11-04: 1 via ORAL
  Filled 2022-11-04: qty 1

## 2022-11-04 MED ORDER — ONDANSETRON HCL 4 MG PO TABS: 4.0000 mg | ORAL_TABLET | Freq: Four times a day (QID) | ORAL | Status: AC | PRN

## 2022-11-04 MED ORDER — HYDROCODONE-ACETAMINOPHEN 5-325 MG PO TABS: 1.0000 | ORAL_TABLET | Freq: Three times a day (TID) | ORAL | 0 refills | Status: AC

## 2022-11-04 MED ORDER — FOLIC ACID 1 MG PO TABS: 1.0000 mg | ORAL_TABLET | Freq: Every day | ORAL | Status: AC

## 2022-11-04 MED ORDER — ENSURE ENLIVE PO LIQD
237.0000 mL | Freq: Three times a day (TID) | ORAL | Status: DC
  Administered 2022-11-04: 237 mL via ORAL

## 2022-11-04 MED ORDER — VITAMIN B-1 100 MG PO TABS: 100.0000 mg | ORAL_TABLET | Freq: Every day | ORAL | Status: AC

## 2022-11-04 MED ORDER — MELATONIN 5 MG PO TABS: 5.0000 mg | ORAL_TABLET | Freq: Every evening | ORAL | Status: AC | PRN

## 2022-11-04 MED ORDER — INFLUENZA VAC A&B SURF ANT ADJ 0.5 ML IM SUSY
0.5000 mL | PREFILLED_SYRINGE | INTRAMUSCULAR | Status: DC
  Filled 2022-11-04: qty 0.5

## 2022-11-04 MED ORDER — ASCORBIC ACID 500 MG PO TABS: 500.0000 mg | ORAL_TABLET | Freq: Two times a day (BID) | ORAL | Status: AC

## 2022-11-04 MED ORDER — ENSURE ENLIVE PO LIQD: 237.0000 mL | Freq: Three times a day (TID) | ORAL | Status: AC

## 2022-11-04 MED ORDER — VITAMIN D (ERGOCALCIFEROL) 1.25 MG (50000 UNIT) PO CAPS: 50000.0000 [IU] | ORAL_CAPSULE | ORAL | Status: AC

## 2022-11-04 MED ORDER — NICOTINE 14 MG/24HR TD PT24: 14.0000 mg | MEDICATED_PATCH | Freq: Every day | TRANSDERMAL | Status: AC

## 2022-11-04 MED ORDER — POLYETHYLENE GLYCOL 3350 17 G PO PACK: 17.0000 g | PACK | Freq: Every day | ORAL | Status: AC | PRN

## 2022-11-04 MED ORDER — ADULT MULTIVITAMIN W/MINERALS CH: 1.0000 | ORAL_TABLET | Freq: Every day | ORAL | Status: AC

## 2022-11-04 NOTE — TOC Progression Note (Signed)
Transition of Care [facility]) - Progression Note    Patient Details  Name: Joshua Mora MRN: [id number] Date of Birth: 12-28-1950  [facility] CM/SW Contact  Marlowe Sax, RN Phone Number: 11/04/2022, 11:47 AM  Clinical Narrative:     Called daughter [name] and let her know the room number at [facility], Called EMS to arrange transport He I 2nd on the list for transport  Expected Discharge Plan: Skilled Nursing Facility Barriers to Discharge: Continued Medical Work up  Expected Discharge Plan and Services     Post Acute Care Choice: Home Health, Skilled Nursing Facility Living arrangements for the past 2 months: Single Family Home Expected Discharge Date: 11/04/22                                     Social Determinants of Health (SDOH) Interventions SDOH Screenings   Food Insecurity: No Food Insecurity (10/13/2022)  Housing: Low Risk  (10/13/2022)  Transportation Needs: No Transportation Needs (10/13/2022)  Utilities: Not At Risk (10/13/2022)  Tobacco Use: High Risk (10/13/2022)    Readmission Risk Interventions    11/02/2022    9:06 AM  Readmission Risk Prevention Plan  Transportation Screening Complete  PCP or Specialist Appt within 3-5 Days Complete  HRI or Home Care Consult Complete  Social Work Consult for Recovery Care Planning/Counseling Complete  Palliative Care Screening Not Applicable  Medication Review Oceanographer) Referral to Pharmacy

## 2022-11-04 NOTE — Progress Notes (Signed)
Contacted [facility] and reported to Nurse, Johnny Bridge.

## 2022-11-04 NOTE — Discharge Summary (Signed)
Physician Discharge Summary   Patient: Joshua Mora MRN: [id number] DOB: 22-Jan-1951  Admit date:     10/13/2022  Discharge date: 11/04/22  Discharge Physician: Arnetha Courser   PCP: Sandrea Hughs, NP   Recommendations at discharge:  Please obtain CBC and renal function within a week Follow-up with primary care provider Follow-up with nephrology Follow-up with podiatry in 3 to 4 weeks Patient should be on cam boot on left until seen by podiatry for further recommendation. Follow-up with oncology for persistent leukocytosis.  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  Joshua Mora is a 72 y.o. male with medical history significant of alcohol abuse, tobacco use disorder, diet controlled prediabetes, CKD 3B, osteoarthritis s/p left knee replacement who presented to the ED with complaints of progressive weakness and frequent falls over the past 1 month.     On presentation he was found to have severely elevated creatinine above baseline for which nephrology was consulted. Suspect AKI secondary to NSAID induced nephropathy and concurrent sepsis. No history of diabetes or hypertension per patient. Renal ultrasound with no obstruction. No IV contrast.   -Underwent first hemodialysis treatment on 10/15/2022, first series lasted until 10/21/22.  Then resumed hemodialysis on 9/10 and 9/12.  --PermCath placed on 9/13.  Later renal function stabilizes and dialysis was held.  Permanent catheter was removed on 11/03/2022 and  nephrology will follow-up closely as outpatient.  Patient also had hyperkalemia over the course of hospitalization which was treated medically and with dialysis.  Potassium currently normal.  Renal function stable with creatinine at 2.60.  Also with significant leukocytosis above 46,000 with concern for leukemoid reaction in the setting of presumptive UTI.  Trauma imaging were nonacute.  CT head also nonacute.  Hospital course was complicated by acute hypoxic respiratory failure.  Was found to have mild pulmonary edema on chest x-ray.  Elevated D-dimer above 11>>13. with concern for possible pulmonary embolism.  Heparin drip initiated.  Which was later discontinued when VQ scan and lower extremity venous Doppler was negative.  Oncology was also consulted for persistent and significant leukocytosis.Slowly improving, pathologist smear review with immature granulocytes, initial smear review with bandemia. Urine cultures with Klebsiella pneumonia but patient is being treated with appropriate antibiotics. likely leukemoid reaction with severe inflammation and infection but due to persistent elevation patient underwent bone marrow biopsy on 9/10-showed reactive changes. Cytogenetics and myeloid NGS is pending.   Patient met sepsis criteria with tachycardia, severe leukocytosis and severe sepsis with lactic acidosis above 4.3. Initial procalcitonin was 60.67 which is decreasing.  Patient developed on and off low-grade fever.  All other cultures remain negative which include left knee aspirate.  ID was also consulted and patient completed a course of antibiotic, ID is now advising monitoring without antibiotics for now.  Patient was also found to have severe vitamin D deficiency and started on supplement.  Due to persistent left ankle pain and negative x-ray for any acute fracture, patient has MRI of left ankle with complete tear of talofibular ligament  and multiple other ligamental injuries. -Podiatry was  consulted-recommending conservative management with pain control and CAM boot for 4 to 6 weeks and outpatient follow-up --cont Norco PRN.  Due to persistent weakness and history of frequent falls, patient underwent PT evaluation and management and they were recommending going to rehab to regain his strength.  Patient is being discharged to rehab for further management.  Patient need to follow-up with podiatry, nephrology and oncology as outpatient for further recommendations.  Patient will continue on current management and follow-up with his providers closely for further recommendations.  Assessment and Plan: * Acute renal failure (HCC) Patient with no prior diagnosis but came with significant AKI and oliguria on admission. History of regular NSAID use which can be contributory. Patient received dialysis in 2 separate sessions, renal function currently stable and making urine.  Permanent HD catheter was removed on 11/03/2022 and patient will follow-up closely with nephrology for further recommendations.  Creatinine at 2.6 on discharge.  Severe sepsis [facility]) Patient met sepsis criteria with tachycardia, severe leukocytosis and severe sepsis with lactic acidosis above 4.3.  Initial procalcitonin was 60.67 and then improving Did not found to have Klebsiella pneumonia in urine culture and rest of the cultures remain negative.  Completed a course of antibiotics. -ID consult -Completed the course of antibiotic and ID is advising monitoring without antibiotic for now. -Continue to monitor  Leukocytosis Slowly improving, pathologist smear review with immature granulocytes, initial smear review with bandemia.  Urine cultures with Klebsiella pneumonia but patient is being treated with appropriate antibiotics. Significantly elevated D-dimer which continued to get worse.  VTE has been ruled out with negative VQ scan and lower extremity venous Doppler. -Completed the course of antibiotic -Hematology  consult-ordered some more labs, likely leukemoid reaction with severe inflammation and infection but due to persistent elevation patient underwent bone marrow biopsy on 9/10-shows reactive changes. Patient will follow-up with oncology  Elevated d-dimer Patient with worsening D-dimer, now at 13.  COVID was negative. DVT has been ruled out with negative VQ scan and lower extremity venous Doppler.  Acute hypoxemic respiratory failure (HCC) Patient did develop acute hypoxic respiratory failure and repeat chest x-ray at that time with concern of pulmonary congestion.  Echocardiogram with normal EF and grade 1 diastolic dysfunction.  VQ scan negative for PE Patient initially received supplemental oxygen and now on room air.  Frequent falls Patient with history of recent decline, poor appetite and multiple falls.  First fall was on July 4 and since then he was falling couple of times a week.  Left knee imaging done on 8/23 with a concern of nondisplaced fibular neck fracture, Initial plan was outpatient follow-up with orthopedic surgery. Requested orthopedic surgery to evaluate while he is in hospital B12 normal, significantly low vitamin D-started on supplement. PT/OT are recommending SNF  Nicotine dependence, uncomplicated -Nicotine patch  Traumatic rhabdomyolysis (HCC) Likely secondary to recurrent falls.  Hyperkalemia Improved, treated medically and with dialysis.  Left ankle pain Patient continued to have worsening pain and edema of left ankle prompted an MRI of left ankle as initial imaging was negative for any fracture. MRI with complete tear of talofibular ligament and multiple other ligamental injuries. -Podiatry was consulted-recommending conservative management with pain control and CAM boot for 4 to 6 weeks and outpatient follow-up -Continue with pain management     Pain control - [place] Controlled Substance Reporting System database was reviewed. and patient was  instructed, not to drive, operate heavy machinery, perform activities at heights,  swimming or participation in water activities or provide baby-sitting services while on Pain, Sleep and Anxiety Medications; until their outpatient Physician has advised to do so again. Also recommended to not to take more than prescribed Pain, Sleep and Anxiety Medications.  Consultants: Nephrology.  Orthopedic surgery.  ID.  Podiatry Procedures performed: Hemodialysis, bone marrow biopsy, left knee arthrocentesis. Disposition: Skilled nursing facility Diet recommendation:  Discharge Diet Orders (From admission, onward)     Start     Ordered   11/04/22 0000  Diet - low sodium heart healthy        11/04/22 1030           Cardiac diet DISCHARGE MEDICATION: Allergies as of 11/04/2022   No Known Allergies      Medication List     STOP taking these medications    IBU 800 MG tablet Generic drug: ibuprofen   meloxicam 15 MG tablet Commonly known as: MOBIC   traMADol 50 MG tablet Commonly known as: ULTRAM       TAKE these medications    Acetaminophen Extra Strength 500 MG Tabs Take 500-1,000 mg by mouth every 4 (four) hours as needed (Pain).   ascorbic acid 500 MG tablet Commonly known as: VITAMIN C Take 1 tablet (500 mg total) by mouth 2 (two) times daily.   cyanocobalamin 1000 MCG tablet Commonly known as: VITAMIN B12 Take 1,000 mcg by mouth daily.   feeding supplement Liqd Take 237 mLs by mouth 3 (three) times daily between meals.   folic acid 1 MG tablet Commonly known as: FOLVITE Take 1 tablet (1 mg total) by mouth daily.   HYDROcodone-acetaminophen 5-325 MG tablet Commonly known as: NORCO/VICODIN Take 1-2 tablets by mouth 3 (three) times daily. What changed:  how much to take when to take this reasons to take this   meclizine 25 MG tablet Commonly known as: ANTIVERT Take 25 mg by mouth every 6 (six) hours as needed.   melatonin 5 MG Tabs Take 1 tablet (5 mg total)  by mouth at bedtime as needed.   multivitamin with minerals Tabs tablet Take 1 tablet by mouth daily.   nicotine 14 mg/24hr patch Commonly known as: NICODERM CQ - dosed in mg/24 hours Place 1 patch (14 mg total) onto the skin daily.   ondansetron 4 MG tablet Commonly known as: ZOFRAN Take 1 tablet (4 mg total) by mouth every 6 (six) hours as needed for nausea.   polyethylene glycol 17 g packet Commonly known as: MIRALAX / GLYCOLAX Take 17 g by mouth daily as needed for mild constipation.   thiamine 100 MG tablet Commonly known as: Vitamin B-1 Take 1 tablet (100 mg total) by mouth daily.   Viagra 100 MG tablet Generic drug: sildenafil Take 100 mg by mouth as needed for erectile dysfunction.   Vitamin D (Ergocalciferol) 1.25 MG (50000 UNIT) Caps capsule Commonly known as: DRISDOL Take 1 capsule (50,000 Units total) by mouth every 7 (seven) days.               Discharge Care Instructions  (From admission, onward)           Start     Ordered   11/04/22 0000  No dressing needed        11/04/22 1030            Contact information for follow-up providers     Sandrea Hughs, NP. Schedule an appointment as soon as possible for a visit in 1 week(s).   Specialty:  Nurse Practitioner Contact information: [address] [place] [address] [phone]         Lamont Dowdy, MD. Schedule an appointment as soon as possible for a visit.   Specialty: Nephrology Contact information: [address] [place] [address] [phone]         Standiford, Jenelle Mages, DPM. Schedule an appointment as soon as possible for a visit.   Specialty: Podiatry Contact information: [address] [place] [address] [phone]         Michaelyn Barter, MD. Schedule an appointment as soon as possible for a visit.   Specialty: Oncology Contact information: [address] [place]  [address] [phone]              Contact information for after-discharge care     Destination     [facility] .   Service: Skilled Nursing Contact information: [address] [place] [address] [phone]                    Discharge Exam: Filed Weights   11/02/22 0500 11/03/22 0436 11/04/22 0500  Weight: 79.6 kg 79.8 kg 77.4 kg   General.  Well-developed elderly man, in no acute distress. Pulmonary.  Lungs clear bilaterally, normal respiratory effort. CV.  Regular rate and rhythm, no JVD, rub or murmur. Abdomen.  Soft, nontender, nondistended, BS positive. CNS.  Alert and oriented .  No focal neurologic deficit. Extremities.  No edema, no cyanosis, pulses intact and symmetrical.  Left leg with Cam boot Psychiatry.  Judgment and insight appears normal.   Condition at discharge: stable  The results of significant diagnostics from this hospitalization (including imaging, microbiology, ancillary and laboratory) are listed below for reference.   Imaging Studies: PERIPHERAL VASCULAR CATHETERIZATION  Result Date: 11/03/2022 See surgical note for result.  PERIPHERAL VASCULAR CATHETERIZATION  Result Date: 10/28/2022 See surgical note for result.  PERIPHERAL VASCULAR CATHETERIZATION  Result Date: 10/25/2022 See surgical note for result.  IR BONE MARROW BIOPSY & ASPIRATION  Result Date: 10/25/2022 INDICATION: Hematologic abnormality EXAM: Bone marrow aspiration and core biopsy using fluoroscopic guidance MEDICATIONS: None. ANESTHESIA/SEDATION: Moderate (conscious) sedation was employed during this procedure. A total of Versed 0.5 mg and Fentanyl 50 mcg was administered intravenously. Moderate Sedation Time: 16 minutes. The patient's level of consciousness and vital signs were monitored continuously by radiology nursing throughout the procedure under my direct supervision. FLUOROSCOPY TIME:  Fluoroscopy Time: 1.0 minutes  (29 mGy) COMPLICATIONS: None immediate. PROCEDURE: Informed written consent was obtained from the patient after a thorough discussion of the procedural risks, benefits and alternatives. All questions were addressed. Maximal Sterile Barrier Technique was utilized including caps, mask, sterile gowns, sterile gloves, sterile drape, hand hygiene and skin antiseptic. A timeout was performed prior to the initiation of the procedure. The patient was placed prone on the exam table. Limited fluoroscopy of the pelvis was performed for planning purposes. Skin entry site was marked, and the overlying skin was prepped and draped in the standard sterile fashion. Local analgesia was obtained with 1% lidocaine. Using fluoroscopic guidance, an 11 gauge needle was advanced just deep to the cortex of the right posterior ilium. Subsequently, bone marrow aspiration and core biopsy were performed. Specimens were submitted to lab/pathology for handling. Hemostasis was achieved with manual pressure, and a clean dressing was placed. The patient tolerated the procedure well without immediate complication. IMPRESSION: Successful bone marrow aspiration and core  biopsy of the right posterior ilium using fluoroscopic guidance. Electronically Signed   By: Olive Bass M.D.   On: 10/25/2022 11:21   MR ANKLE LEFT WO CONTRAST  Result Date: 10/22/2022 CLINICAL DATA:  Left ankle pain after multiple falls EXAM: MRI OF THE LEFT ANKLE WITHOUT CONTRAST TECHNIQUE: Multiplanar, multisequence MR imaging of the ankle was performed. No intravenous contrast was administered. COMPARISON:  X-ray 10/13/2022 FINDINGS: Technical Note: Despite efforts by the technologist and patient, motion artifact is present on today's exam and could not be eliminated. This reduces exam sensitivity and specificity. TENDONS Peroneal: Peroneus longus and brevis tendons are intact. The superior peroneal retinaculum is torn (series 6, image 17). Slight medial subluxation of the  peroneal tendons without dislocation. Posteromedial: Tibialis posterior, flexor hallucis longus, and flexor digitorum longus tendons are intact and normally positioned. Anterior: Tibialis anterior, extensor hallucis longus, and extensor digitorum longus tendons are intact and normally positioned. Achilles: Distal Achilles tendinosis with tiny partial-thickness insertional tear of the central tendon (series 8, image 13). No full-thickness or retracted tear. No retrocalcaneal bursal fluid. Plantar Fascia: Intact. LIGAMENTS Lateral: Intact tibiofibular ligaments. Complete tear of the anterior talofibular ligament. The posterior talofibular and calcaneofibular ligaments are thickened and heterogeneous compatible with high-grade sprains. Medial: Deltoid and spring ligaments are severely degenerated, likely sequela of remote trauma. CARTILAGE AND BONES Ankle Joint: Diffuse cartilage thinning, most severely at the medial talar shoulder and within the medial gutter. 6 mm intra-articular loose body adjacent to the lateral talar shoulder anteriorly. There is varus tilt of the talus. No dislocation. Small tibiotalar joint effusion. Subtalar Joints/Sinus Tarsi: No cartilage defect. No effusion. Preservation of the anatomic fat within the sinus tarsi. Bones: No acute fracture or dislocation. Mild marrow edema within the lateral malleolus. Mild pes planovalgus alignment. Other: Elongated fluid collection the lateral aspect of the lower leg and ankle measuring approximately 6.7 x 0.8 x 6.1 cm. Soft tissue swelling of the dorsal foot. IMPRESSION: 1. Complete tear of the anterior talofibular ligament. High-grade sprains of the posterior talofibular and calcaneofibular ligaments. 2. Mild marrow edema within the lateral malleolus, which may represent a bone contusion. No acute fracture. 3. The superior peroneal retinaculum is torn. 4. Elongated fluid collection the lateral aspect of the lower leg and ankle measuring approximately 6.7  x 0.8 x 6.1 cm, likely a hematoma. A fascial degloving/Morel-Lavallee lesion could also have this appearance. 5. 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 6. Distal Achilles tendinosis with tiny partial-thickness insertional tear of the central tendon. No full-thickness or retracted tear. Electronically Signed   By: Duanne Guess D.O.   On: 10/22/2022 13:30   NM Pulmonary Perfusion  Result Date: 10/15/2022 CLINICAL DATA:  Short of breath.  Concern for pulmonary embolism. EXAM: NUCLEAR MEDICINE PERFUSION LUNG SCAN TECHNIQUE: Perfusion images were obtained in multiple projections after intravenous injection of radiopharmaceutical. Patient imaged with LEFT arm down RADIOPHARMACEUTICALS:  4.7 mCi Tc-74m MAA COMPARISON:  Radiograph 10/15/2022 FINDINGS: No wedge-shaped peripheral perfusion defect within LEFT or RIGHT lung to suggest acute pulmonary embolism. Decreased regional perfusion of the lung apices suggest emphysema. IMPRESSION: No evidence acute pulmonary embolism. Electronically Signed   By: Genevive Bi M.D.   On: 10/15/2022 13:59   DG Chest Port 1 View  Result Date: 10/15/2022 CLINICAL DATA:  Central line placement EXAM: PORTABLE CHEST 1 VIEW COMPARISON:  10/14/2022 FINDINGS: Right internal jugular dialysis catheter in place with the tip in the right atrium. No pneumothorax. Vascular congestion.  Interstitial prominence could reflect interstitial edema. Heart mediastinal contours within normal limits. No visible effusions. IMPRESSION: Right hilus catheter placement with the tip in the right atrium. No pneumothorax. Vascular congestion. Diffuse interstitial prominence could reflect interstitial edema. Electronically Signed   By: Charlett Nose M.D.   On: 10/15/2022 01:29   US Venous Img Upper Uni Left (DVT)  Result Date: 10/14/2022 CLINICAL DATA:  Left arm swelling EXAM: LEFT UPPER EXTREMITY VENOUS DOPPLER ULTRASOUND TECHNIQUE: Gray-scale  sonography with graded compression, as well as color Doppler and duplex ultrasound were performed to evaluate the upper extremity deep venous system from the level of the subclavian vein and including the jugular, axillary, basilic, radial, ulnar and upper cephalic vein. Spectral Doppler was utilized to evaluate flow at rest and with distal augmentation maneuvers. COMPARISON:  None Available. FINDINGS: Contralateral Subclavian Vein: Respiratory phasicity is normal and symmetric with the symptomatic side. No evidence of thrombus. Normal compressibility. Internal Jugular Vein: No evidence of thrombus. Normal compressibility, respiratory phasicity and response to augmentation. Subclavian Vein: No evidence of thrombus. Normal compressibility, respiratory phasicity and response to augmentation. Axillary Vein: No evidence of thrombus. Normal compressibility, respiratory phasicity and response to augmentation. Cephalic Vein: No evidence of thrombus. Normal compressibility, respiratory phasicity and response to augmentation. Basilic Vein: No evidence of thrombus. Normal compressibility, respiratory phasicity and response to augmentation. Brachial Veins: No evidence of thrombus. Normal compressibility, respiratory phasicity and response to augmentation. Radial Veins: No evidence of thrombus. Normal compressibility, respiratory phasicity and response to augmentation. Ulnar Veins: No evidence of thrombus. Normal compressibility, respiratory phasicity and response to augmentation. Other Findings:  None visualized. IMPRESSION: 1. No evidence of deep venous thrombosis within the left upper extremity. Electronically Signed   By: Sharlet Salina M.D.   On: 10/14/2022 16:56   DG Chest Port 1 View  Result Date: 10/14/2022 CLINICAL DATA:  Hypoxic respiratory failure EXAM: PORTABLE CHEST 1 VIEW COMPARISON:  Chest radiograph 1 day prior FINDINGS: The cardiomediastinal silhouette is stable. A calcified right lower paratracheal lymph  node is unchanged. Probable mild pulmonary interstitial edema is unchanged. There is no new or worsening focal airspace disease. There is no pleural effusion or pneumothorax There is no acute osseous abnormality. IMPRESSION: Unchanged mild pulmonary edema. Electronically Signed   By: Lesia Hausen M.D.   On: 10/14/2022 16:02   ECHOCARDIOGRAM COMPLETE  Result Date: 10/14/2022    ECHOCARDIOGRAM REPORT   Patient Name:   JHAN WALBECK Date of Exam: 10/14/2022 Medical Rec #:  [id number]            Height:       69.0 in Accession #:    [id number]           Weight:       185.6 lb Date of Birth:  09-Jul-1950            BSA:          2.002 m Patient Age:    72 years             BP:           120/61 mmHg Patient Gender: M                    HR:           107 bpm. Exam Location:  [facility] Procedure: 2D Echo, Cardiac Doppler, Color Doppler and Intracardiac            Opacification Agent Indications:  Elevated Troponin  History:         Patient has no prior history of Echocardiogram examinations.                  Signs/Symptoms:Chest Pain; Risk Factors:Diabetes, Dyslipidemia                  and Current Smoker.  Sonographer:     Mikki Harbor Referring Phys:  [id number] Oliver Pila HALL Diagnosing Phys: Julien Nordmann MD  Sonographer Comments: Technically difficult study due to poor echo windows. Image acquisition challenging due to respiratory motion. IMPRESSIONS  1. Left ventricular ejection fraction, by estimation, is 60 to 65%. The left ventricle has normal function. The left ventricle has no regional wall motion abnormalities. There is mild left ventricular hypertrophy. Left ventricular diastolic parameters are consistent with Grade I diastolic dysfunction (impaired relaxation).  2. Right ventricular systolic function is normal. The right ventricular size is normal. Tricuspid regurgitation signal is inadequate for assessing PA pressure.  3. The mitral valve is normal in structure. No evidence of mitral valve  regurgitation. No evidence of mitral stenosis.  4. The aortic valve has an indeterminant number of cusps. Aortic valve regurgitation is mild. Aortic valve sclerosis is present, with no evidence of aortic valve stenosis.  5. The inferior vena cava is normal in size with greater than 50% respiratory variability, suggesting right atrial pressure of 3 mmHg. FINDINGS  Left Ventricle: Left ventricular ejection fraction, by estimation, is 60 to 65%. The left ventricle has normal function. The left ventricle has no regional wall motion abnormalities. Definity contrast agent was given IV to delineate the left ventricular  endocardial borders. The left ventricular internal cavity size was normal in size. There is mild left ventricular hypertrophy. Left ventricular diastolic parameters are consistent with Grade I diastolic dysfunction (impaired relaxation). Right Ventricle: The right ventricular size is normal. No increase in right ventricular wall thickness. Right ventricular systolic function is normal. Tricuspid regurgitation signal is inadequate for assessing PA pressure. Left Atrium: Left atrial size was normal in size. Right Atrium: Right atrial size was normal in size. Pericardium: There is no evidence of pericardial effusion. Mitral Valve: The mitral valve is normal in structure. No evidence of mitral valve regurgitation. No evidence of mitral valve stenosis. MV peak gradient, 4.5 mmHg. The mean mitral valve gradient is 2.0 mmHg. Tricuspid Valve: The tricuspid valve is normal in structure. Tricuspid valve regurgitation is mild . No evidence of tricuspid stenosis. Aortic Valve: The aortic valve has an indeterminant number of cusps. Aortic valve regurgitation is mild. Aortic valve sclerosis is present, with no evidence of aortic valve stenosis. Aortic valve mean gradient measures 5.0 mmHg. Aortic valve peak gradient measures 11.2 mmHg. Aortic valve area, by VTI measures 3.30 cm. Pulmonic Valve: The pulmonic valve was  normal in structure. Pulmonic valve regurgitation is not visualized. No evidence of pulmonic stenosis. Aorta: The aortic root is normal in size and structure. Venous: The inferior vena cava is normal in size with greater than 50% respiratory variability, suggesting right atrial pressure of 3 mmHg. IAS/Shunts: No atrial level shunt detected by color flow Doppler.  LEFT VENTRICLE PLAX 2D LVIDd:         4.40 cm   Diastology LVIDs:         2.90 cm   LV e' medial:    7.83 cm/s LV PW:         1.20 cm   LV E/e' medial:  10.2 LV IVS:  1.20 cm   LV e' lateral:   8.38 cm/s LVOT diam:     2.20 cm   LV E/e' lateral: 9.6 LV SV:         77 LV SV Index:   39 LVOT Area:     3.80 cm  RIGHT VENTRICLE RV Basal diam:  2.95 cm RV Mid diam:    2.90 cm RV S prime:     18.80 cm/s LEFT ATRIUM           Index        RIGHT ATRIUM           Index LA diam:      2.90 cm 1.45 cm/m   RA Area:     12.20 cm LA Vol (A4C): 46.8 ml 23.38 ml/m  RA Volume:   27.00 ml  13.49 ml/m  AORTIC VALVE                    PULMONIC VALVE AV Area (Vmax):    2.94 cm     PV Vmax:       1.00 m/s AV Area (Vmean):   3.49 cm     PV Peak grad:  4.0 mmHg AV Area (VTI):     3.30 cm AV Vmax:           167.00 cm/s AV Vmean:          97.100 cm/s AV VTI:            0.234 m AV Peak Grad:      11.2 mmHg AV Mean Grad:      5.0 mmHg LVOT Vmax:         129.00 cm/s LVOT Vmean:        89.100 cm/s LVOT VTI:          0.203 m LVOT/AV VTI ratio: 0.87  AORTA Ao Root diam: 3.20 cm MITRAL VALVE MV Area (PHT): 3.46 cm     SHUNTS MV Area VTI:   4.06 cm     Systemic VTI:  0.20 m MV Peak grad:  4.5 mmHg     Systemic Diam: 2.20 cm MV Mean grad:  2.0 mmHg MV Vmax:       1.06 m/s MV Vmean:      61.0 cm/s MV Decel Time: 219 msec MV E velocity: 80.20 cm/s MV A velocity: 114.00 cm/s MV E/A ratio:  0.70 Julien Nordmann MD Electronically signed by Julien Nordmann MD Signature Date/Time: 10/14/2022/1:31:20 PM    Final    US Venous Img Lower Bilateral (DVT)  Result Date: 10/14/2022 CLINICAL  DATA:  10026 Shortness of breath 10026 EXAM: BILATERAL LOWER EXTREMITY VENOUS DOPPLER ULTRASOUND TECHNIQUE: Gray-scale sonography with graded compression, as well as color Doppler and duplex ultrasound were performed to evaluate the lower extremity deep venous systems from the level of the common femoral vein and including the common femoral, femoral, profunda femoral, popliteal and calf veins including the posterior tibial, peroneal and gastrocnemius veins when visible. The superficial great saphenous vein was also interrogated. Spectral Doppler was utilized to evaluate flow at rest and with distal augmentation maneuvers in the common femoral, femoral and popliteal veins. COMPARISON:  Lower extremity XRs, 10/13/2022. FINDINGS: RIGHT LOWER EXTREMITY VENOUS Normal compressibility of the RIGHT common femoral, superficial femoral, and popliteal veins, as well as the visualized calf veins. Visualized portions of profunda femoral vein and great saphenous vein unremarkable. No filling defects to suggest DVT on grayscale or color Doppler imaging. Doppler waveforms  show normal direction of venous flow, normal respiratory plasticity and response to augmentation. OTHER No evidence of superficial thrombophlebitis or abnormal fluid collection. Limitations: none LEFT LOWER EXTREMITY VENOUS Normal compressibility of the LEFT common femoral, superficial femoral, and popliteal veins, as well as the visualized calf veins. Visualized portions of profunda femoral vein and great saphenous vein unremarkable. No filling defects to suggest DVT on grayscale or color Doppler imaging. Doppler waveforms show normal direction of venous flow, normal respiratory plasticity and response to augmentation. OTHER No evidence of superficial thrombophlebitis or abnormal fluid collection. Limitations: none IMPRESSION: No evidence of femoropopliteal DVT or superficial thrombophlebitis within either lower extremity. Roanna Banning, MD [facility] Electronically Signed   By: Roanna Banning M.D.   On: 10/14/2022 06:21   DG Chest Port 1 View  Result Date: 10/13/2022 CLINICAL DATA:  36644 Acute respiratory distress 66502 EXAM: PORTABLE CHEST 1 VIEW.  Patient is rotated. COMPARISON:  Chest x-ray 10/13/2022, chest x-ray report 05/15/2017 FINDINGS: Slightly more prominent thoracic aorta silhouette likely due to portable AP technique and slight patient rotation. The heart and mediastinal contours are otherwise unchanged. Aortic calcification. Chronic calcified right lower paratracheal region lymph nodes suggestive of prior granulomatous disease. Limited evaluation of the lung apices due to overlying mandible. Increased interstitial markings. No focal consolidation. No pulmonary edema. No pleural effusion. No pneumothorax. No acute osseous abnormality. IMPRESSION: 1. Mild pulmonary edema. 2.  Aortic Atherosclerosis (ICD10-I70.0). Electronically Signed   By: Tish Frederickson M.D.   On: 10/13/2022 21:30   DG Ankle Complete Left  Result Date: 10/13/2022 CLINICAL DATA:  Left ankle pain after multiple falls. EXAM: LEFT ANKLE COMPLETE - 3+ VIEW COMPARISON:  None Available. FINDINGS: There is no evidence of acute fracture, dislocation, or joint effusion. There is no evidence of arthropathy. Rounded densities are seen inferior to medial malleolus suggesting degenerative change or old injury. Soft tissues are unremarkable. IMPRESSION: No acute abnormality seen. Electronically Signed   By: Lupita Raider M.D.   On: 10/13/2022 18:20   CT HEAD WO CONTRAST ( )  Result Date: 10/13/2022 CLINICAL DATA:  Generalized weakness and multiple falls. EXAM: CT HEAD WITHOUT CONTRAST TECHNIQUE: Contiguous axial images were obtained from the base of the skull through the vertex without intravenous contrast. RADIATION DOSE REDUCTION: This exam was performed according to the departmental dose-optimization program which  includes automated exposure control, adjustment of the mA and/or kV according to patient size and/or use of iterative reconstruction technique. COMPARISON:  August 18, 2022 FINDINGS: Brain: There is mild to moderate severity cerebral atrophy with widening of the extra-axial spaces and ventricular dilatation. There are areas of decreased attenuation within the white matter tracts of the supratentorial brain, consistent with microvascular disease changes. 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 Vascular: No hyperdense vessel or unexpected calcification. Skull: A chronic left-sided nasal bone fracture is seen. Sinuses/Orbits: No acute finding. Other: None. IMPRESSION: 1. Generalized cerebral atrophy with chronic white matter small vessel ischemic changes. 2. 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 3. No acute intracranial abnormality. Electronically Signed   By: Aram Candela M.D.   On: 10/13/2022 17:05   US RENAL  Result Date: 10/13/2022 CLINICAL DATA:  Acute renal failure EXAM: RENAL / URINARY TRACT ULTRASOUND COMPLETE COMPARISON:  None Available. FINDINGS: Right Kidney: Renal measurements: 10.0 x 5.1 x 5.3 cm = volume: 139.9 mL. No collecting system dilatation or perinephric fluid. Along the  upper pole of the kidney is a echogenic area along the cortex measuring 15 x 16 x 15 mm this is of uncertain etiology. An underlying lesion is possible Left Kidney: Renal measurements: 10.2 by 6.4 x 4.9 cm = volume: 156.6 mL. Echogenicity within normal limits. No mass or hydronephrosis visualized. Bladder: Bladder is underdistended. Other: None. IMPRESSION: No collecting system dilatation. Ill-defined echogenic area in the upper pole of the right kidney measuring 15 mm. Underlying lesion is not excluded. Dedicated cross-sectional imaging study can be performed when clinically appropriate to confirm presence or absence of the  lesion Electronically Signed   By: Karen Kays M.D.   On: 10/13/2022 16:51   DG Chest 2 View  Result Date: 10/13/2022 CLINICAL DATA:  Chest pain, weakness EXAM: CHEST - 2 VIEW COMPARISON:  None Available. FINDINGS: Mild left basilar atelectasis. Right lung is clear. No pleural effusion or pneumothorax. The heart is normal in size.  Thoracic aortic atherosclerosis. Degenerative changes of the visualized thoracolumbar spine. IMPRESSION: No acute cardiopulmonary disease. Electronically Signed   By: Charline Bills M.D.   On: 10/13/2022 15:40   DG Knee 1-2 Views Right  Result Date: 10/10/2022 CLINICAL DATA:  Bilateral knee pain. EXAM: RIGHT KNEE - 1-2 VIEW COMPARISON:  Tibia/fibular radiographs 08/18/2022 FINDINGS: Medial tibiofemoral joint space narrowing. There is mild tricompartmental peripheral spurring. No evidence of fracture. No erosion or focal bone abnormality. No significant knee joint effusion. Minimal quadriceps tendon enthesophyte. There are vascular calcifications. 5 mm density in the subcutaneous tissues anteriorly over the upper tibia is unchanged from prior tibia/fibular radiographs and may represent foreign body versus soft tissue calcifications. IMPRESSION: 1. 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 2. Possible foreign body versus soft tissue calcification in the anterior soft tissues, unchanged from July exam. Electronically Signed   By: Narda Rutherford M.D.   On: 10/10/2022 10:33   DG Knee Complete 4 Views Left  Result Date: 10/10/2022 CLINICAL DATA:  Bilateral knee pain. History of left knee replacement. EXAM: LEFT KNEE - COMPLETE 4+ VIEW COMPARISON:  None Available. FINDINGS: Left knee arthroplasty in expected alignment. There is no periprosthetic lucency. There is a nondisplaced fracture of the proximal fibular neck that may be acute. No periprosthetic fracture. Prior patellar resurfacing. Geographic sclerotic density in the  distal femur has the appearance of a bone infarct. Minimal knee joint effusion. Quadriceps tendon enthesophyte. There are vascular calcifications. IMPRESSION: 1. Nondisplaced fracture of the proximal fibular neck that may be acute. 2. Left knee arthroplasty in expected alignment. No hardware complication. Electronically Signed   By: Narda Rutherford M.D.   On: 10/10/2022 10:31    Microbiology: Results for orders placed or performed during the hospital encounter of 10/13/22  SARS Coronavirus 2 by RT PCR (hospital order, performed in [facility] hospital lab) *cepheid single result test* Anterior Nasal Swab     Status: None   Collection Time: 10/13/22  3:55 PM   Specimen: Anterior Nasal Swab  Result Value Ref Range Status   SARS Coronavirus 2 by RT PCR NEGATIVE NEGATIVE Final    Comment: (NOTE) SARS-CoV-2 target nucleic acids are NOT DETECTED.  The SARS-CoV-2 RNA is generally detectable in upper and lower respiratory specimens during the acute phase of infection. The lowest concentration of SARS-CoV-2 viral copies this assay can detect is 250 copies / mL. A negative result does not preclude SARS-CoV-2 infection and should not be used as the sole basis for treatment or other patient management decisions.  A  negative result may occur with improper specimen collection / handling, submission of specimen other than nasopharyngeal swab, presence of viral mutation(s) within the areas targeted by this assay, and inadequate number of viral copies (<250 copies / mL). A negative result must be combined with clinical observations, patient history, and epidemiological information.  Fact Sheet for Patients:   RoadLapTop.co.za  Fact Sheet for Healthcare Providers: http://kim-miller.com/  This test is not yet approved or  cleared by the [place] FDA and has been authorized for detection and/or diagnosis of SARS-CoV-2 by FDA under an Emergency Use  Authorization (EUA).  This EUA will remain in effect (meaning this test can be used) for the duration of the COVID-19 declaration under Section 564(b)(1) of the Act, 21 U.S.C. section 360bbb-3(b)(1), unless the authorization is terminated or revoked sooner.  Performed at [facility], [address] [place], [place] [address]   MRSA Next Gen by PCR, Nasal     Status: None   Collection Time: 10/13/22  9:51 PM   Specimen: Nasal Mucosa; Nasal Swab  Result Value Ref Range Status   MRSA by PCR Next Gen NOT DETECTED NOT DETECTED Final    Comment: (NOTE) The GeneXpert MRSA Assay (FDA approved for NASAL specimens only), is one component of a comprehensive MRSA colonization surveillance program. It is not intended to diagnose MRSA infection nor to guide or monitor treatment for MRSA infections. Test performance is not FDA approved in patients less than 42 years old. Performed at [facility], [address], [place], [place] [address]   Urine Culture     Status: Abnormal   Collection Time: 10/14/22  7:32 AM   Specimen: Urine, Random  Result Value Ref Range Status   Specimen Description   Final    URINE, RANDOM Performed at [facility], [address] [place], [place] [address]    Special Requests   Final    NONE Reflexed from [phone] Performed at [facility], [address] [place], [place] [address]    Culture >=100,000 COLONIES/mL KLEBSIELLA PNEUMONIAE (A)  Final   Report Status 10/16/2022 FINAL  Final   Organism ID, Bacteria KLEBSIELLA PNEUMONIAE (A)  Final      Susceptibility   Klebsiella pneumoniae - MIC*    AMPICILLIN >=32 RESISTANT Resistant     CEFAZOLIN <=4 SENSITIVE Sensitive     CEFEPIME <=0.12 SENSITIVE Sensitive     CEFTRIAXONE <=0.25 SENSITIVE Sensitive     CIPROFLOXACIN <=0.25 SENSITIVE Sensitive     GENTAMICIN <=1 SENSITIVE Sensitive     IMIPENEM <=0.25 SENSITIVE Sensitive     NITROFURANTOIN 128 RESISTANT Resistant      TRIMETH/SULFA <=20 SENSITIVE Sensitive     AMPICILLIN/SULBACTAM 8 SENSITIVE Sensitive     PIP/TAZO <=4 SENSITIVE Sensitive     * >=100,000 COLONIES/mL KLEBSIELLA PNEUMONIAE  Culture, blood (Routine X 2) w Reflex to ID Panel     Status: None   Collection Time: 10/15/22  1:29 AM   Specimen: Right Antecubital; Blood  Result Value Ref Range Status   Specimen Description RIGHT ANTECUBITAL  Final   Special Requests   Final    BOTTLES DRAWN AEROBIC AND ANAEROBIC Blood Culture adequate volume   Culture   Final    NO GROWTH 5 DAYS Performed at [facility], [address], [place], [place] [address]    Report Status 10/20/2022 FINAL  Final  Culture, blood (Routine X 2) w Reflex to ID Panel     Status: None  Collection Time: 10/15/22 11:41 AM   Specimen: BLOOD  Result Value Ref Range Status   Specimen Description BLOOD BLOOD RIGHT HAND  Final   Special Requests   Final    BOTTLES DRAWN AEROBIC ONLY Blood Culture results may not be optimal due to an inadequate volume of blood received in culture bottles   Culture   Final    NO GROWTH 5 DAYS Performed at [facility], [address] [place], [place] [address]    Report Status 10/20/2022 FINAL  Final  Body fluid culture w Gram Stain     Status: None   Collection Time: 10/17/22  2:02 PM   Specimen: Synovium; Body Fluid  Result Value Ref Range Status   Specimen Description   Final    SYNOVIAL Performed at [facility], [address] [place], [place] [address]    Special Requests   Final    Normal Performed at [facility], [address] [place], [place] [address]    Gram Stain   Final    FEW WBC PRESENT,BOTH PMN AND MONONUCLEAR NO ORGANISMS SEEN    Culture   Final    NO GROWTH 3 DAYS Performed at [facility], [address], [place], [place] [address]    Report Status 10/21/2022 FINAL  Final    Labs: CBC: Recent Labs  Lab 10/30/22 0531 10/31/22 0228 11/01/22 0325  11/02/22 0240 11/04/22 0505  WBC 23.8* 21.4* 21.3* 20.2* 17.8*  HGB 9.5* 9.4* 9.6* 8.9* 9.2*  HCT 28.9* 28.8* 30.0* 27.3* 28.3*  MCV 92.6 92.0 94.9 93.8 91.9  PLT 350 320 313 296 306   Basic Metabolic Panel: Recent Labs  Lab 10/30/22 0531 10/31/22 0228 11/01/22 0325 11/02/22 0240 11/03/22 0207 11/04/22 0505  NA 136 137 137 137 137 136  K 4.0 4.1 4.2 4.4 4.9 4.8  CL 100 104 102 105 104 105  CO2 25 24 25 24 23 23   GLUCOSE 105* 99 104* 96 92 90  BUN 61* 53* 55* 54* 50* 48*  CREATININE 2.80* 2.57* 2.66* 2.72* 2.60* 2.60*  CALCIUM 8.7* 8.7* 8.7* 8.7* 8.8* 8.6*  MG 2.2 2.1 2.2 2.2  --  2.1   Liver Function Tests: No results for input(s): "AST", "ALT", "ALKPHOS", "BILITOT", "PROT", "ALBUMIN" in the last 168 hours. CBG: Recent Labs  Lab 10/28/22 1648 10/30/22 0832 10/31/22 0822 11/04/22 0824  GLUCAP 111* 86 88 91    Discharge time spent: greater than 30 minutes.  This record has been created using Conservation officer, historic buildings. Errors have been sought and corrected,but may not always be located. Such creation errors do not reflect on the standard of care.   Signed: Arnetha Courser, MD [facility] 11/04/2022

## 2022-11-04 NOTE — TOC Progression Note (Signed)
Transition of Care [facility]) - Progression Note    Patient Details  Name: Joshua Mora MRN: [id number] Date of Birth: 16-Jun-1950  Transition of Care [facility]) CM/SW Contact  Marlowe Sax, RN Phone Number: 11/04/2022, 10:21 AM  Clinical Narrative:    Ins approved to go to [place]   Expected Discharge Plan: Skilled Nursing Facility Barriers to Discharge: Continued Medical Work up  Expected Discharge Plan and Services     Post Acute Care Choice: Home Health, Skilled Nursing Facility Living arrangements for the past 2 months: Single Family Home                                       Social Determinants of Health (SDOH) Interventions SDOH Screenings   Food Insecurity: No Food Insecurity (10/13/2022)  Housing: Low Risk  (10/13/2022)  Transportation Needs: No Transportation Needs (10/13/2022)  Utilities: Not At Risk (10/13/2022)  Tobacco Use: High Risk (10/13/2022)    Readmission Risk Interventions    11/02/2022    9:06 AM  Readmission Risk Prevention Plan  Transportation Screening Complete  PCP or Specialist Appt within 3-5 Days Complete  HRI or Home Care Consult Complete  Social Work Consult for Recovery Care Planning/Counseling Complete  Palliative Care Screening Not Applicable  Medication Review Oceanographer) Referral to Pharmacy

## 2022-11-04 NOTE — Plan of Care (Signed)
Problem: Education: Goal: Knowledge of General Education information will improve Description: Including pain rating scale, medication(s)/side effects and non-pharmacologic comfort measures 11/04/2022 1246 by Fabrice Dyal Bet, LPN Outcome: Adequate for Discharge 11/04/2022 1206 by Joseandres Mazer Bet, LPN Outcome: Progressing   Problem: Health Behavior/Discharge Planning: Goal: Ability to manage health-related needs will improve 11/04/2022 1246 by Aasir Daigler Bet, LPN Outcome: Adequate for Discharge 11/04/2022 1206 by Saliah Crisp Bet, LPN Outcome: Progressing   Problem: Clinical Measurements: Goal: Ability to maintain clinical measurements within normal limits will improve 11/04/2022 1246 by Mohamedamin Nifong Bet, LPN Outcome: Adequate for Discharge 11/04/2022 1206 by Flordia Kassem Bet, LPN Outcome: Progressing Goal: Will remain free from infection 11/04/2022 1246 by Jayanna Kroeger Bet, LPN Outcome: Adequate for Discharge 11/04/2022 1206 by Nisa Decaire Bet, LPN Outcome: Progressing Goal: Diagnostic test results will improve 11/04/2022 1246 by Ivah Girardot Bet, LPN Outcome: Adequate for Discharge 11/04/2022 1206 by Smt. Loder Bet, LPN Outcome: Progressing Goal: Respiratory complications will improve 11/04/2022 1246 by Zachery Niswander Bet, LPN Outcome: Adequate for Discharge 11/04/2022 1206 by Johnedward Brodrick Bet, LPN Outcome: Progressing Goal: Cardiovascular complication will be avoided 11/04/2022 1246 by Kamaree Wheatley Bet, LPN Outcome: Adequate for Discharge 11/04/2022 1206 by Jaysion Ramseyer Bet, LPN Outcome: Progressing   Problem: Activity: Goal: Risk for activity intolerance will decrease 11/04/2022 1246 by Aubree Doody Bet, LPN Outcome: Adequate for Discharge 11/04/2022 1206 by Ollie Delano Bet, LPN Outcome: Progressing   Problem: Nutrition: Goal: Adequate nutrition will be maintained 11/04/2022 1246 by Gabryela Kimbrell Bet, LPN Outcome: Adequate for Discharge 11/04/2022 1206 by Oseias Horsey Bet, LPN Outcome: Progressing    Problem: Coping: Goal: Level of anxiety will decrease 11/04/2022 1246 by Ferlin Fairhurst Bet, LPN Outcome: Adequate for Discharge 11/04/2022 1206 by Terrick Allred Bet, LPN Outcome: Progressing   Problem: Elimination: Goal: Will not experience complications related to bowel motility 11/04/2022 1246 by Naylee Frankowski Bet, LPN Outcome: Adequate for Discharge 11/04/2022 1206 by Paislea Hatton Bet, LPN Outcome: Progressing Goal: Will not experience complications related to urinary retention 11/04/2022 1246 by Joan Avetisyan Bet, LPN Outcome: Adequate for Discharge 11/04/2022 1206 by Jahni Nazar Bet, LPN Outcome: Progressing   Problem: Pain Managment: Goal: General experience of comfort will improve 11/04/2022 1246 by Jermarion Poffenberger Bet, LPN Outcome: Adequate for Discharge 11/04/2022 1206 by Luan Maberry Bet, LPN Outcome: Progressing   Problem: Safety: Goal: Ability to remain free from injury will improve 11/04/2022 1246 by Hiroki Wint Bet, LPN Outcome: Adequate for Discharge 11/04/2022 1206 by Kylen Schliep Bet, LPN Outcome: Progressing   Problem: Skin Integrity: Goal: Risk for impaired skin integrity will decrease 11/04/2022 1246 by Leveda Kendrix Bet, LPN Outcome: Adequate for Discharge 11/04/2022 1206 by Ziyah Cordoba Bet, LPN Outcome: Progressing   Problem: Acute Rehab OT Goals (only OT should resolve) Goal: Pt. Will Perform Grooming Outcome: Adequate for Discharge Goal: Pt. Will Perform Lower Body Dressing Outcome: Adequate for Discharge Goal: Pt. Will Transfer To Toilet Outcome: Adequate for Discharge Goal: Pt. Will Perform Toileting-Clothing Manipulation Outcome: Adequate for Discharge   Problem: Inadequate Intake (NI-2.1) Goal: Food and/or nutrient delivery Description: Individualized approach for food/nutrient provision. Outcome: Adequate for Discharge   Problem: Acute Rehab PT Goals(only PT should resolve) Goal: Pt Will Go Supine/Side To Sit Outcome: Adequate for Discharge Goal: Patient Will Perform  Sitting Balance Outcome: Adequate for Discharge Goal: Patient Will Transfer Sit To/From Stand Outcome: Adequate for Discharge Goal: Pt Will Transfer Bed To Chair/Chair To Bed Outcome: Adequate for Discharge Goal: Pt Will Ambulate Outcome: Adequate  for Discharge

## 2022-11-04 NOTE — Progress Notes (Signed)
Nutrition Follow-up  DOCUMENTATION CODES:   Not applicable  INTERVENTION:   -D/c Nepro Shake po TID, each supplement provides 425 kcal and 19 grams protein  -D/c Renal MVI daily -D/c 30 ml Prosource Plus BID, each supplement provides 100 kcals and 15 grams protein -Liberalize diet to regular for widest variety of meal selections -Ensure Enlive po TID, each supplement provides 350 kcal and 20 grams of protein -Magic cup TID with meals, each supplement provides 290 kcal and 9 grams of protein   NUTRITION DIAGNOSIS:   Inadequate oral intake related to poor appetite as evidenced by per patient/family report.  Ongoing  GOAL:   Patient will meet greater than or equal to 90% of their needs  Progressing  MONITOR:   PO intake, Supplement acceptance, Labs, Weight trends, Skin, I & O's, Diet advancement  REASON FOR ASSESSMENT:   Malnutrition Screening Tool    ASSESSMENT:   72 y.o. male with medical history significant of osteoarthritis s/p left knee replacement who is admitted with weakness, frequent falls and AKI.  9/19- s/p Removal of right jugular Permcath   Reviewed I/O's: -400 ml x 24 hours and -7.9 L since 10/21/22  UOP: 400 ml x 24 hours   Per nephrology notes, renal function has received and no longer needs HD.   Pt with poor oral intake. Noted meal completions 10%.   Per palliative care notes, pt desires aggressive care, but does not desire PEG tube. Plan for short term SNF placement.   Reviewed wt hx; pt has experienced a 4.4% wt loss over the past week, which is significant for time frame.   Medications reviewed and include folic acid, thiamine, and vitamin D.   Labs reviewed: CBGS: 91. Phos: 6.0.    Diet Order:   Diet Order             Diet 2 gram sodium Fluid consistency: Thin  Diet effective now                   EDUCATION NEEDS:   Education needs have been addressed  Skin:  Skin Assessment: Reviewed RN Assessment  Last BM:  11/02/22 (type  6)  Height:   Ht Readings from Last 1 Encounters:  10/14/22 5\' 9"  (1.753 m)    Weight:   Wt Readings from Last 1 Encounters:  11/04/22 77.4 kg    Ideal Body Weight:  72.7 kg  BMI:  Body mass index is 25.2 kg/m.  Estimated Nutritional Needs:   Kcal:  1900-2200kcal/day  Protein:  95-110g/day  Fluid:  1.9-2.1 L    Levada Schilling, RD, LDN, CDCES Registered Dietitian II Certified Diabetes Care and Education Specialist Please refer to [facility] for RD and/or RD on-call/weekend/after hours pager

## 2022-11-04 NOTE — Progress Notes (Signed)
EMS transported pt from [facility] to [facility].

## 2022-11-04 NOTE — Plan of Care (Signed)
  Problem: Nutrition: Goal: Adequate nutrition will be maintained Outcome: Progressing   Problem: Activity: Goal: Risk for activity intolerance will decrease Outcome: Progressing   Problem: Elimination: Goal: Will not experience complications related to bowel motility Outcome: Progressing   Problem: Skin Integrity: Goal: Risk for impaired skin integrity will decrease Outcome: Progressing   Problem: Pain Managment: Goal: General experience of comfort will improve Outcome: Progressing

## 2022-11-04 NOTE — Progress Notes (Signed)
[facility]  ROUNDING NOTE   Subjective:   Patient sitting up in bed Alert and oriented Requesting heat in room to be increased Requesting discharge plan, states he hasn't been told anything.   Creatinine 2.64  Objective:  Vital signs in last 24 hours:  Temp:  [97.9 F (36.6 C)-99.2 F (37.3 C)] 99.2 F (37.3 C) (09/20 0742) Pulse Rate:  [99-106] 99 (09/20 0742) Resp:  [14-18] 16 (09/20 0742) BP: (106-127)/(55-74) 115/61 (09/20 0742) SpO2:  [94 %-98 %] 98 % (09/20 0742) Weight:  [77.4 kg] 77.4 kg (09/20 0500)  Weight change: -2.4 kg Filed Weights   11/02/22 0500 11/03/22 0436 11/04/22 0500  Weight: 79.6 kg 79.8 kg 77.4 kg    Intake/Output: I/O last 3 completed shifts: In: -  Out: 1250 [Urine:1250]   Intake/Output this shift:  No intake/output data recorded.  Physical Exam: General: NAD  Head: Normocephalic, atraumatic. Moist oral mucosal membranes  Eyes: Anicteric  Neck: Supple  Lungs:  Clear to auscultation,normal effort  Heart: Regular rate and rhythm  Abdomen:  Soft, nontender  Extremities:  No peripheral edema.  Neurologic: Alertl, moving all four extremities  Skin: No lesions  Access: RIJ permcath removed on 11/03/22    Basic Metabolic Panel: Recent Labs  Lab 10/30/22 0531 10/31/22 0228 11/01/22 0325 11/02/22 0240 11/03/22 0207 11/04/22 0505 11/04/22 1031  NA 136 137 137 137 137 136 137  K 4.0 4.1 4.2 4.4 4.9 4.8 4.9  CL 100 104 102 105 104 105 103  CO2 25 24 25 24 23 23 22   GLUCOSE 105* 99 104* 96 92 90 155*  BUN 61* 53* 55* 54* 50* 48* 46*  CREATININE 2.80* 2.57* 2.66* 2.72* 2.60* 2.60* 2.64*  CALCIUM 8.7* 8.7* 8.7* 8.7* 8.8* 8.6* 9.0  MG 2.2 2.1 2.2 2.2  --  2.1  --   PHOS  --   --   --   --   --   --  4.0    Liver Function Tests: Recent Labs  Lab 11/04/22 1031  ALBUMIN 2.6*    No results for input(s): "LIPASE", "AMYLASE" in the last 168 hours. No results for input(s): "AMMONIA" in the last 168  hours.  CBC: Recent Labs  Lab 10/31/22 0228 11/01/22 0325 11/02/22 0240 11/04/22 0505 11/04/22 1031  WBC 21.4* 21.3* 20.2* 17.8* 18.5*  HGB 9.4* 9.6* 8.9* 9.2* 9.5*  HCT 28.8* 30.0* 27.3* 28.3* 29.6*  MCV 92.0 94.9 93.8 91.9 94.0  PLT 320 313 296 306 326    Cardiac Enzymes: No results for input(s): "CKTOTAL", "CKMB", "CKMBINDEX", "TROPONINI" in the last 168 hours.  BNP: Invalid input(s): "POCBNP"  CBG: Recent Labs  Lab 10/28/22 1648 10/30/22 0832 10/31/22 0822 11/04/22 0824  GLUCAP 111* 86 88 91    Microbiology: Results for orders placed or performed during the hospital encounter of 10/13/22  SARS Coronavirus 2 by RT PCR (hospital order, performed in [facility] hospital lab) *cepheid single result test* Anterior Nasal Swab     Status: None   Collection Time: 10/13/22  3:55 PM   Specimen: Anterior Nasal Swab  Result Value Ref Range Status   SARS Coronavirus 2 by RT PCR NEGATIVE NEGATIVE Final    Comment: (NOTE) SARS-CoV-2 target nucleic acids are NOT DETECTED.  The SARS-CoV-2 RNA is generally detectable in upper and lower respiratory specimens during the acute phase of infection. The lowest concentration of SARS-CoV-2 viral copies this assay can detect is 250 copies / mL. A negative result does not preclude  SARS-CoV-2 infection and should not be used as the sole basis for treatment or other patient management decisions.  A negative result may occur with improper specimen collection / handling, submission of specimen other than nasopharyngeal swab, presence of viral mutation(s) within the areas targeted by this assay, and inadequate number of viral copies (<250 copies / mL). A negative result must be combined with clinical observations, patient history, and epidemiological information.  Fact Sheet for Patients:   RoadLapTop.co.za  Fact Sheet for Healthcare Providers: http://kim-miller.com/  This test is not yet  approved or  cleared by the [place] FDA and has been authorized for detection and/or diagnosis of SARS-CoV-2 by FDA under an Emergency Use Authorization (EUA).  This EUA will remain in effect (meaning this test can be used) for the duration of the COVID-19 declaration under Section 564(b)(1) of the Act, 21 U.S.C. section 360bbb-3(b)(1), unless the authorization is terminated or revoked sooner.  Performed at [facility], [address] [place], [place] [address]   MRSA Next Gen by PCR, Nasal     Status: None   Collection Time: 10/13/22  9:51 PM   Specimen: Nasal Mucosa; Nasal Swab  Result Value Ref Range Status   MRSA by PCR Next Gen NOT DETECTED NOT DETECTED Final    Comment: (NOTE) The GeneXpert MRSA Assay (FDA approved for NASAL specimens only), is one component of a comprehensive MRSA colonization surveillance program. It is not intended to diagnose MRSA infection nor to guide or monitor treatment for MRSA infections. Test performance is not FDA approved in patients less than 29 years old. Performed at [facility], [address] [place], [place] [address]   Urine Culture     Status: Abnormal   Collection Time: 10/14/22  7:32 AM   Specimen: Urine, Random  Result Value Ref Range Status   Specimen Description   Final    URINE, RANDOM Performed at [facility], [address] [place], [place] [address]    Special Requests   Final    NONE Reflexed from [id number] Performed at [facility], [address] [place], [place] [address]    Culture >=100,000 COLONIES/mL KLEBSIELLA PNEUMONIAE (A)  Final   Report Status 10/16/2022 FINAL  Final   Organism ID, Bacteria KLEBSIELLA PNEUMONIAE (A)  Final      Susceptibility   Klebsiella pneumoniae - MIC*    AMPICILLIN >=32 RESISTANT Resistant     CEFAZOLIN <=4 SENSITIVE Sensitive     CEFEPIME <=0.12 SENSITIVE Sensitive     CEFTRIAXONE <=0.25 SENSITIVE Sensitive     CIPROFLOXACIN <=0.25  SENSITIVE Sensitive     GENTAMICIN <=1 SENSITIVE Sensitive     IMIPENEM <=0.25 SENSITIVE Sensitive     NITROFURANTOIN 128 RESISTANT Resistant     TRIMETH/SULFA <=20 SENSITIVE Sensitive     AMPICILLIN/SULBACTAM 8 SENSITIVE Sensitive     PIP/TAZO <=4 SENSITIVE Sensitive     * >=100,000 COLONIES/mL KLEBSIELLA PNEUMONIAE  Culture, blood (Routine X 2) w Reflex to ID Panel     Status: None   Collection Time: 10/15/22  1:29 AM   Specimen: Right Antecubital; Blood  Result Value Ref Range Status   Specimen Description RIGHT ANTECUBITAL  Final   Special Requests   Final    BOTTLES DRAWN AEROBIC AND ANAEROBIC Blood Culture adequate volume   Culture   Final    NO GROWTH 5 DAYS Performed at [facility], [address], [place], [place] [address]    Report Status 10/20/2022 FINAL  Final  Culture, blood (Routine X 2) w Reflex to ID Panel     Status: None   Collection Time: 10/15/22 11:41 AM   Specimen: BLOOD  Result Value Ref Range Status   Specimen Description BLOOD BLOOD RIGHT HAND  Final   Special Requests   Final    BOTTLES DRAWN AEROBIC ONLY Blood Culture results may not be optimal due to an inadequate volume of blood received in culture bottles   Culture   Final    NO GROWTH 5 DAYS Performed at [facility], [address] [place], [place] [address]    Report Status 10/20/2022 FINAL  Final  Body fluid culture w Gram Stain     Status: None   Collection Time: 10/17/22  2:02 PM   Specimen: Synovium; Body Fluid  Result Value Ref Range Status   Specimen Description   Final    SYNOVIAL Performed at [facility], [address] [place], [place] [address]    Special Requests   Final    Normal Performed at [facility], [address] [place], [place] [address]    Gram Stain   Final    FEW WBC PRESENT,BOTH PMN AND MONONUCLEAR NO ORGANISMS SEEN    Culture   Final    NO GROWTH 3 DAYS Performed at [facility], [address]  [place], [place] [address]    Report Status 10/21/2022 FINAL  Final    Coagulation Studies: No results for input(s): "LABPROT", "INR" in the last 72 hours.  Urinalysis: No results for input(s): "COLORURINE", "LABSPEC", "PHURINE", "GLUCOSEU", "HGBUR", "BILIRUBINUR", "KETONESUR", "PROTEINUR", "UROBILINOGEN", "NITRITE", "LEUKOCYTESUR" in the last 72 hours.  Invalid input(s): "APPERANCEUR"    Imaging: PERIPHERAL VASCULAR CATHETERIZATION  Result Date: 11/03/2022 See surgical note for result.    Medications:    sodium chloride     calcium gluconate     sodium chloride      vitamin C  500 mg Oral BID   Chlorhexidine Gluconate Cloth  6 each Topical Q0600   cyanocobalamin  1,000 mcg Oral Daily   insulin aspart  10 Units Intravenous Once   And   dextrose  1 ampule Intravenous Once   feeding supplement  237 mL Oral TID BM   folic acid  1 mg Oral Daily   heparin injection (subcutaneous)  5,000 Units Subcutaneous Q8H   HYDROcodone-acetaminophen  1-2 tablet Oral TID   [START ON 11/05/2022] influenza vaccine adjuvanted  0.5 mL Intramuscular Tomorrow-1000   multivitamin with minerals  1 tablet Oral Daily   nicotine  14 mg Transdermal Daily   thiamine  100 mg Oral Daily   Or   thiamine  100 mg Intravenous Daily   traMADol  50 mg Oral Once   Vitamin D (Ergocalciferol)  50,000 Units Oral Q7 days   acetaminophen, chlorproMAZINE, heparin, melatonin, ondansetron **OR** ondansetron (ZOFRAN) IV, mouth rinse, polyethylene glycol, prochlorperazine, traZODone  Assessment/ Plan:  Mr. Carlus Ullery is a 72 y.o.  male with tobacco use, alcohol abuse, osteoarthritis status post left knee replacement and hyperlipidemia, who is admitted on 10/13/2022 for AKI (acute kidney injury) (HCC) [N17.9]  Acute Kidney Injury on chronic kidney disease stage IIIB: Baseline creatinine of 1.74, GFR of 41 on 08/18/22. Suspect patient's acute kidney injury secondary to NSAID induced nephropathy and concurrent sepsis.  No history of diabetes or hypertension per patient. Renal ultrasound with no obstruction. No IV contrast. -Underwent first hemodialysis treatment on 10/15/2022, first series lasted until 10/21/22.  Then resumed hemodialysis on 9/10 and 9/12.  -Creatinine stable - Appreciate vascular removing permcath yesterday - Will continue to follow patient in outpatient office.   2. Urinary tract infection with sepsis: Klebsiella species.  - Completed all therapies  3.  Anemia with renal failure: hemoglobin 9.5. Was prescribed an ESA with dialysis  4. 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

## 2022-11-04 NOTE — Progress Notes (Signed)
  Progress Note    11/04/2022 11:20 AM 1 Day Post-Op  Subjective:  Joshua Mora is a 72 yo male who is now POD #1 from Dialysis perma catheter removal. Patient is resting comfortably in bed this morning. Patient denies any pain to the site. Endorses it's a little sore. Patient endorses he is ready to go home. Patient recovering as expected.    Vitals:   11/04/22 0053 11/04/22 0742  BP: (!) 121/59 115/61  Pulse: 100 99  Resp: 18 16  Temp: 98.6 F (37 C) 99.2 F (37.3 C)  SpO2: 98% 98%   Physical Exam: Cardiac:  RRR, Normal S1, S2. No murmurs Lungs:  Normal Respiratory effort, Clear on auscultation.  Incisions:  Right Chest with dressing clean dry and intact.  Extremities:  Palpable pulses throughout.  Abdomen:  Positive bowel sounds, Soft, non tender and non distended.  Neurologic: AAOX 3 Fluent speech  CBC    Component Value Date/Time   WBC 18.5 (H) 11/04/2022 1031   RBC 3.15 (L) 11/04/2022 1031   HGB 9.5 (L) 11/04/2022 1031   HCT 29.6 (L) 11/04/2022 1031   PLT 326 11/04/2022 1031   MCV 94.0 11/04/2022 1031   MCH 30.2 11/04/2022 1031   MCHC 32.1 11/04/2022 1031   RDW 17.1 (H) 11/04/2022 1031   LYMPHSABS 2.9 10/28/2022 0402   MONOABS 2.2 (H) 10/28/2022 0402   EOSABS 0.2 10/28/2022 0402   BASOSABS 0.1 10/28/2022 0402    BMET    Component Value Date/Time   NA 137 11/04/2022 1031   K 4.9 11/04/2022 1031   CL 103 11/04/2022 1031   CO2 22 11/04/2022 1031   GLUCOSE 155 (H) 11/04/2022 1031   BUN 46 (H) 11/04/2022 1031   CREATININE 2.64 (H) 11/04/2022 1031   CALCIUM 9.0 11/04/2022 1031   GFRNONAA 25 (L) 11/04/2022 1031    INR    Component Value Date/Time   INR 1.4 (H) 10/16/2022 1347     Intake/Output Summary (Last 24 hours) at 11/04/2022 1120 Last data filed at 11/04/2022 1028 Gross per 24 hour  Intake 0 ml  Output 400 ml  Net -400 ml     Assessment/Plan:  72 y.o. male is s/p Dialysis Perma Catheter Removal  1 Day Post-Op   PLAN: Per vascular  surgery patient if medically appropriate okay to discharge home today. Patient will need to be taking ASA 81 mg daily. Plavix 75 mg daily and Eliquis 2.5 mg twice Daily.    DVT prophylaxis:  ASA 81 mg daily, Plavix 75 mg daily and Eliquis 2.5 mg daily.    Marcie [facility] 11/04/2022 11:20 AM

## 2022-11-09 ENCOUNTER — Encounter (HOSPITAL_COMMUNITY): Payer: Self-pay

## 2022-11-11 ENCOUNTER — Encounter (HOSPITAL_COMMUNITY): Payer: Self-pay

## 2022-11-17 ENCOUNTER — Ambulatory Visit: Payer: Medicare PPO | Admitting: Podiatry

## 2022-11-21 ENCOUNTER — Ambulatory Visit: Payer: Medicare PPO | Admitting: Podiatry

## 2022-11-29 ENCOUNTER — Ambulatory Visit: Payer: Medicare PPO | Admitting: Podiatry

## 2022-11-29 ENCOUNTER — Encounter: Payer: Self-pay | Admitting: Podiatry

## 2022-11-29 ENCOUNTER — Ambulatory Visit: Payer: Medicare PPO

## 2022-11-29 ENCOUNTER — Ambulatory Visit (INDEPENDENT_AMBULATORY_CARE_PROVIDER_SITE_OTHER): Payer: Medicare PPO

## 2022-11-29 VITALS — BP 125/63 | HR 93

## 2022-11-29 DIAGNOSIS — M779 Enthesopathy, unspecified: Secondary | ICD-10-CM | POA: Diagnosis not present

## 2022-11-29 DIAGNOSIS — M778 Other enthesopathies, not elsewhere classified: Secondary | ICD-10-CM

## 2022-11-29 DIAGNOSIS — I70223 Atherosclerosis of native arteries of extremities with rest pain, bilateral legs: Secondary | ICD-10-CM

## 2022-11-29 NOTE — Progress Notes (Signed)
Chief Complaint  Patient presents with   Foot Pain    "His left is getting better but he still has pain in it.  The right one is more of concern now.  He keeps falling and has hurt that one." N - pain top of foot L - dorsal pain right D - July 4th, fell through sliding glass door. O - suddenly, gotten worse C - sharp pain, swelling A - pressure T - none  N - ankle sprain L - anterior ankle lt D - July  O - suddenly C - swelling, sharp shooting pain A - walking, standing T - ER, boot, MRI - level 3 sprain    HPI: 72 y.o. male PMHx Smoker 1/2 ppd x 50+ yrs presenting today for evaluation of pain to the bilateral lower extremities.  Patient states that he has been experiencing pain and tenderness to the right foot for the last several months.  He does recall an injury he sustained on 08/18/2022 when he fell through a sliding glass door.  Patient also states that he has been experiencing left ankle pain over the past few weeks.  Sudden onset.  He went to the emergency department and an immobilization cam boot was prescribed.  MRI completed negative for fracture.  Past Medical History:  Diagnosis Date   Arthritis    Chest pain     Past Surgical History:  Procedure Laterality Date   COLONOSCOPY WITH PROPOFOL N/A 09/12/2016   Procedure: COLONOSCOPY WITH PROPOFOL;  Surgeon: Scot Jun, MD;  Location: [facility] ENDOSCOPY;  Service: Endoscopy;  Laterality: N/A;   COLONOSCOPY WITH PROPOFOL N/A 04/02/2020   Procedure: COLONOSCOPY WITH PROPOFOL;  Surgeon: Pasty Spillers, MD;  Location: [facility] ENDOSCOPY;  Service: Endoscopy;  Laterality: N/A;   DIALYSIS/PERMA CATHETER INSERTION N/A 10/28/2022   Procedure: DIALYSIS/PERMA CATHETER INSERTION;  Surgeon: Renford Dills, MD;  Location: [facility] INVASIVE CV LAB;  Service: Cardiovascular;  Laterality: N/A;   DIALYSIS/PERMA CATHETER REMOVAL N/A 11/03/2022   Procedure: DIALYSIS/PERMA CATHETER REMOVAL;  Surgeon: Annice Needy, MD;  Location: [facility]  INVASIVE CV LAB;  Service: Cardiovascular;  Laterality: N/A;   IR BONE MARROW BIOPSY & ASPIRATION  10/25/2022   JOINT REPLACEMENT     LT TKA 1995   ROTATOR CUFF REPAIR Left 2013   SHOULDER ARTHROSCOPY WITH ROTATOR CUFF REPAIR Right 11/15/2017   Procedure: SHOULDER ARTHROSCOPY WITH ROTATOR CUFF REPAIR;  Surgeon: Lyndle Herrlich, MD;  Location: [facility] ORS;  Service: Orthopedics;  Laterality: Right;   TEMPORARY DIALYSIS CATHETER N/A 10/25/2022   Procedure: TEMPORARY DIALYSIS CATHETER;  Surgeon: Renford Dills, MD;  Location: [facility] INVASIVE CV LAB;  Service: Cardiovascular;  Laterality: N/A;    No Known Allergies   Physical Exam: General: The patient is alert and oriented x3 in no acute distress.  Dermatology: Skin is warm, dry and supple bilateral lower extremities.   Vascular: Skin is cool to touch.  Concern for vascular compromise.  Pulses appear to be nonpalpable.    Neurological: Grossly intact via light touch  Musculoskeletal Exam: No pedal deformities noted.  Pain and tenderness associated to the bilateral lower extremities.  Suspect possible ischemic pain secondary to vascular compromise  Radiographic Exam RT foot 12/07/2022:  No acute fracture identified.  Moderate degenerative changes noted with DJD especially to the midtarsal joints.  MR ANKLE LEFT WO CONTRAST 10/21/2022  IMPRESSION: 1. Complete tear of the anterior talofibular ligament. High-grade sprains of the posterior talofibular and calcaneofibular ligaments. 2. Mild marrow edema  within the lateral malleolus, which may represent a bone contusion. No acute fracture. 3. The superior peroneal retinaculum is torn. 4. Elongated fluid collection the lateral aspect of the lower leg and ankle measuring approximately 6.7 x 0.8 x 6.1 cm, likely a hematoma. A fascial degloving/Morel-Lavallee lesion could also have this appearance. 5. 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 6.  Distal Achilles tendinosis with tiny partial-thickness insertional tear of the central tendon. No full-thickness or retracted tear.  Assessment/Plan of Care: 1.  Concern for vascular compromise and limb ischemia bilateral lower extremities 2.  Severe LT ankle sprain  -Patient evaluated.  X-rays reviewed -I do believe the patient is experiencing ischemic pain.  He has rest pain as well as pain with elevation of the feet. -Order placed for ABIs bilateral lower extremities.  Will plan to contact the patient after results of ABIs are available -In the meantime recommend staying in the cam boot to the left lower extremity.  Rest as needed. -Return to clinic as needed       Felecia Shelling, DPM [facility]  Dr. Felecia Shelling, DPM    [address] [place], [place] [address]                Office [phone]  Fax [phone]

## 2022-11-30 ENCOUNTER — Encounter: Payer: Self-pay | Admitting: Primary Care

## 2022-12-05 ENCOUNTER — Other Ambulatory Visit: Payer: Medicare PPO

## 2022-12-05 ENCOUNTER — Other Ambulatory Visit: Payer: Self-pay | Admitting: *Deleted

## 2022-12-05 ENCOUNTER — Inpatient Hospital Stay: Payer: Medicare PPO | Attending: Internal Medicine | Admitting: Internal Medicine

## 2022-12-05 ENCOUNTER — Inpatient Hospital Stay: Payer: Medicare PPO

## 2022-12-05 VITALS — BP 108/62 | HR 103 | Temp 98.4°F | Wt 161.0 lb

## 2022-12-05 DIAGNOSIS — N1832 Chronic kidney disease, stage 3b: Secondary | ICD-10-CM | POA: Diagnosis not present

## 2022-12-05 DIAGNOSIS — D631 Anemia in chronic kidney disease: Secondary | ICD-10-CM

## 2022-12-05 DIAGNOSIS — Z87891 Personal history of nicotine dependence: Secondary | ICD-10-CM | POA: Diagnosis not present

## 2022-12-05 DIAGNOSIS — N179 Acute kidney failure, unspecified: Secondary | ICD-10-CM | POA: Diagnosis not present

## 2022-12-05 DIAGNOSIS — D72829 Elevated white blood cell count, unspecified: Secondary | ICD-10-CM

## 2022-12-05 DIAGNOSIS — Z79899 Other long term (current) drug therapy: Secondary | ICD-10-CM | POA: Insufficient documentation

## 2022-12-05 DIAGNOSIS — N186 End stage renal disease: Secondary | ICD-10-CM | POA: Insufficient documentation

## 2022-12-05 DIAGNOSIS — Z992 Dependence on renal dialysis: Secondary | ICD-10-CM | POA: Diagnosis not present

## 2022-12-05 LAB — CBC WITH DIFFERENTIAL/PLATELET
Abs Immature Granulocytes: 0.06 10*3/uL (ref 0.00–0.07)
Basophils Absolute: 0 10*3/uL (ref 0.0–0.1)
Basophils Relative: 0 %
Eosinophils Absolute: 0.2 10*3/uL (ref 0.0–0.5)
Eosinophils Relative: 1 %
HCT: 29.7 % — ABNORMAL LOW (ref 39.0–52.0)
Hemoglobin: 9.3 g/dL — ABNORMAL LOW (ref 13.0–17.0)
Immature Granulocytes: 1 %
Lymphocytes Relative: 25 %
Lymphs Abs: 3.2 10*3/uL (ref 0.7–4.0)
MCH: 30 pg (ref 26.0–34.0)
MCHC: 31.3 g/dL (ref 30.0–36.0)
MCV: 95.8 fL (ref 80.0–100.0)
Monocytes Absolute: 0.9 10*3/uL (ref 0.1–1.0)
Monocytes Relative: 7 %
Neutro Abs: 8.3 10*3/uL — ABNORMAL HIGH (ref 1.7–7.7)
Neutrophils Relative %: 66 %
Platelets: 328 10*3/uL (ref 150–400)
RBC: 3.1 MIL/uL — ABNORMAL LOW (ref 4.22–5.81)
RDW: 17.4 % — ABNORMAL HIGH (ref 11.5–15.5)
WBC: 12.7 10*3/uL — ABNORMAL HIGH (ref 4.0–10.5)
nRBC: 0 % (ref 0.0–0.2)

## 2022-12-05 LAB — CMP (CANCER CENTER ONLY)
ALT: 10 U/L (ref 0–44)
AST: 14 U/L — ABNORMAL LOW (ref 15–41)
Albumin: 3.3 g/dL — ABNORMAL LOW (ref 3.5–5.0)
Alkaline Phosphatase: 35 U/L — ABNORMAL LOW (ref 38–126)
Anion gap: 10 (ref 5–15)
BUN: 30 mg/dL — ABNORMAL HIGH (ref 8–23)
CO2: 22 mmol/L (ref 22–32)
Calcium: 8.9 mg/dL (ref 8.9–10.3)
Chloride: 102 mmol/L (ref 98–111)
Creatinine: 2.45 mg/dL — ABNORMAL HIGH (ref 0.61–1.24)
GFR, Estimated: 27 mL/min — ABNORMAL LOW (ref >60)
Glucose, Bld: 148 mg/dL — ABNORMAL HIGH (ref 70–99)
Potassium: 4.9 mmol/L (ref 3.5–5.1)
Sodium: 134 mmol/L — ABNORMAL LOW (ref 135–145)
Total Bilirubin: 0.4 mg/dL (ref 0.3–1.2)
Total Protein: 8.1 g/dL (ref 6.5–8.1)

## 2022-12-05 MED ORDER — FERROUS GLUCONATE 324 (38 FE) MG PO TABS: 324.0000 mg | ORAL_TABLET | ORAL | 3 refills | Status: AC

## 2022-12-06 DIAGNOSIS — D649 Anemia, unspecified: Secondary | ICD-10-CM | POA: Insufficient documentation

## 2022-12-06 NOTE — Progress Notes (Signed)
[facility]  [phone] Fax:(336) [phone]  ID: Joshua Mora OB: April 26, 1950  MR#: [id number]  QIO#:[id number]  Patient Care Team: Sandrea Hughs, NP as PCP - General (Nurse Practitioner) Michaelyn Barter, MD as Consulting Physician (Oncology)   HPI: Joshua Mora is a 72 y.o. male with pmh of CKD stage III, alcohol use admitted in Aug 2024 for progressive weakness and falls.Marland Kitchen  He was found to have AKI on CKD started on dialysis transiently.  Renal ultrasound negative for obstruction.  Likely secondary from NSAID use and rhabdomyolysis.  Hospitalization complicated by fevers and Klebsiella UTI.  Blood cultures negative.  Had leukocytosis with peak WBC of 51.4 with predominant neutrophilia, mild left shift with 1 to 5% metamyelocytes and occasional myelocyte.  No blast noted.  Initially was thought from infection.  Leukocytosis was persistent although improved despite resolution of infection.  S/p bone marrow biopsy - On 10/25/2022 which showed hypercellular bone marrow with myeloid and megakaryocytic hyperplasia, polyclonal/reactive plasmacytosis.  Findings were favored to be reactive.  Cytogenetics normal XY karyotype.  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   Repeat CBC shows improvement in WBC to 12.7, hemoglobin 9.3 and normal platelets  Interval history Patient was seen today as hospital follow-up accompanied with daughter. He was in the wheelchair.  He has good and bad days.  Feels cold at times.  Denies any bleeding in urine or stool.  No longer on dialysis.  REVIEW OF SYSTEMS:   ROS  As per HPI. Otherwise, a complete review of systems is negative.  PAST MEDICAL HISTORY: Past Medical History:  Diagnosis Date   Arthritis    Chest pain     PAST SURGICAL HISTORY: Past Surgical History:  Procedure Laterality Date   COLONOSCOPY WITH PROPOFOL N/A 09/12/2016   Procedure: COLONOSCOPY WITH PROPOFOL;  Surgeon: Scot Jun, MD;  Location: [facility] ENDOSCOPY;  Service: Endoscopy;  Laterality: N/A;   COLONOSCOPY WITH PROPOFOL N/A 04/02/2020   Procedure: COLONOSCOPY WITH PROPOFOL;  Surgeon: Pasty Spillers, MD;  Location: [facility] ENDOSCOPY;  Service: Endoscopy;  Laterality: N/A;   DIALYSIS/PERMA CATHETER INSERTION N/A 10/28/2022   Procedure: DIALYSIS/PERMA CATHETER INSERTION;  Surgeon: Renford Dills, MD;  Location: [facility] INVASIVE CV LAB;  Service: Cardiovascular;  Laterality: N/A;   DIALYSIS/PERMA CATHETER REMOVAL N/A 11/03/2022   Procedure: DIALYSIS/PERMA CATHETER REMOVAL;  Surgeon: Annice Needy, MD;  Location: [facility] INVASIVE CV LAB;  Service: Cardiovascular;  Laterality: N/A;   IR BONE MARROW BIOPSY & ASPIRATION  10/25/2022   JOINT REPLACEMENT     LT TKA 1995   ROTATOR CUFF REPAIR Left 2013   SHOULDER ARTHROSCOPY WITH ROTATOR CUFF REPAIR Right 11/15/2017   Procedure: SHOULDER ARTHROSCOPY WITH ROTATOR CUFF REPAIR;  Surgeon: Lyndle Herrlich, MD;  Location: [facility] ORS;  Service: Orthopedics;  Laterality: Right;   TEMPORARY DIALYSIS CATHETER N/A 10/25/2022   Procedure: TEMPORARY DIALYSIS CATHETER;  Surgeon: Renford Dills, MD;  Location: [facility] INVASIVE CV LAB;  Service: Cardiovascular;  Laterality: N/A;    FAMILY HISTORY: No family history on file.  HEALTH MAINTENANCE: Social History   Tobacco Use   Smoking status: Former    Types: Cigarettes, Cigars   Smokeless tobacco: Never   Tobacco comments:    5 per day  Vaping Use   Vaping status: Never Used  Substance Use Topics   Alcohol use: Not Currently    Comment: occ   Drug use: No     No Known Allergies  Current Outpatient Medications  Medication Sig Dispense Refill   Acetaminophen Extra Strength 500 MG TABS Take 500-1,000 mg by mouth every 4 (four) hours as needed (Pain).     ascorbic acid (VITAMIN C) 500 MG tablet Take 1 tablet (500 mg total) by mouth 2 (two) times daily.     ferrous gluconate (FERGON) 324 MG tablet Take 1 tablet (324 mg  total) by mouth every Monday, Wednesday, and Friday. 36 tablet 3   folic acid (FOLVITE) 1 MG tablet Take 1 tablet (1 mg total) by mouth daily.     HYDROcodone-acetaminophen (NORCO/VICODIN) 5-325 MG tablet Take 1-2 tablets by mouth 3 (three) times daily. 30 tablet 0   Multiple Vitamin (MULTIVITAMIN WITH MINERALS) TABS tablet Take 1 tablet by mouth daily.     nicotine (NICODERM CQ - DOSED IN MG/24 HOURS) 14 mg/24hr patch Place 1 patch (14 mg total) onto the skin daily.     polyethylene glycol (MIRALAX / GLYCOLAX) 17 g packet Take 17 g by mouth daily as needed for mild constipation.     thiamine (VITAMIN B-1) 100 MG tablet Take 1 tablet (100 mg total) by mouth daily.     VIAGRA 100 MG tablet Take 100 mg by mouth as needed for erectile dysfunction.     vitamin B-12 (CYANOCOBALAMIN) 1000 MCG tablet Take 1,000 mcg by mouth daily.     Vitamin D, Ergocalciferol, (DRISDOL) 1.25 MG (50000 UNIT) CAPS capsule Take 1 capsule (50,000 Units total) by mouth every 7 (seven) days.     feeding supplement (ENSURE ENLIVE / ENSURE PLUS) LIQD Take 237 mLs by mouth 3 (three) times daily between meals. (Patient not taking: Reported on 12/05/2022)     meclizine (ANTIVERT) 25 MG tablet Take 25 mg by mouth every 6 (six) hours as needed. (Patient not taking: Reported on 12/05/2022)     melatonin 5 MG TABS Take 1 tablet (5 mg total) by mouth at bedtime as needed. (Patient not taking: Reported on 12/05/2022)     ondansetron (ZOFRAN) 4 MG tablet Take 1 tablet (4 mg total) by mouth every 6 (six) hours as needed for nausea. (Patient not taking: Reported on 12/05/2022)     No current facility-administered medications for this visit.    OBJECTIVE: Vitals:   12/05/22 1149  BP: 108/62  Pulse: (!) 103  Temp: 98.4 F (36.9 C)  SpO2: 100%     Body mass index is 23.78 kg/m.      General: Well-developed, well-nourished, no acute distress. Eyes: Pink conjunctiva, anicteric sclera. HEENT: Normocephalic, moist mucous membranes,  clear oropharnyx. Lungs: Clear to auscultation bilaterally. Heart: Regular rate and rhythm. No rubs, murmurs, or gallops. Abdomen: Soft, nontender, nondistended. No organomegaly noted, normoactive bowel sounds. Musculoskeletal: No edema, cyanosis, or clubbing. Neuro: Alert, answering all questions appropriately. Cranial nerves grossly intact. Skin: No rashes or petechiae noted. Psych: Normal affect. Lymphatics: No cervical, calvicular, axillary or inguinal LAD.   LAB RESULTS:  Lab Results  Component Value Date   NA 134 (L) 12/05/2022   K 4.9 12/05/2022   CL 102 12/05/2022   CO2 22 12/05/2022   GLUCOSE 148 (H) 12/05/2022   BUN 30 (H) 12/05/2022   CREATININE 2.45 (H) 12/05/2022   CALCIUM 8.9 12/05/2022   PROT 8.1 12/05/2022   ALBUMIN 3.3 (L) 12/05/2022   AST 14 (L) 12/05/2022   ALT 10 12/05/2022   ALKPHOS 35 (L) 12/05/2022   BILITOT 0.4 12/05/2022   GFRNONAA 27 (L) 12/05/2022    Lab Results  Component Value Date   WBC  12.7 (H) 12/05/2022   NEUTROABS 8.3 (H) 12/05/2022   HGB 9.3 (L) 12/05/2022   HCT 29.7 (L) 12/05/2022   MCV 95.8 12/05/2022   PLT 328 12/05/2022    Lab Results  Component Value Date   TIBC 151 (L) 10/18/2022   FERRITIN 315 10/16/2022   IRONPCTSAT 13 (L) 10/18/2022     STUDIES: DG Foot Complete Right  Result Date: 11/29/2022 Please see detailed radiograph report in office note.   ASSESSMENT AND PLAN:   Joshua Mora is a 72 y.o. male with pmh of CKD stage III, alcohol use presented to ED follow-up with progressive weakness and falls.Marland Kitchen  He was found to have AKI on CKD started on dialysis.Hospitalization complicated by fevers and Klebsiella UTI. Hematology consulted for persistent leucocytosis.    # Leucocytosis  -Reactive in nature from infection/stress/acute illness -Had leukocytosis with peak WBC of 51.4 with predominant neutrophilia, mild left shift with 1 to 5% metamyelocytes and occasional myelocyte.  No blast noted.  Initially was  thought from infection.  Leukocytosis was persistent although improved despite resolution of infection.  BCR/ABL negative.  Flow cytometry negative.   - S/p bone marrow biopsy - On 10/25/2022 which showed hypercellular bone marrow with myeloid and megakaryocytic hyperplasia, polyclonal/reactive plasmacytosis.  Findings were favored to be reactive.  Cytogenetics normal XY karyotype.  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   - Repeat CBC shows improvement in WBC to 12.7, hemoglobin 9.3 and normal platelets.      # Anemia -Iron panel consistent with anemia of chronic disease.  Iron saturation was 13%.  Advised to start oral iron 3 times a week with breakfast with vitamin C.  If unable to tolerate can consider gentle iron.  Vitamin B12 and folate normal. -Likely coming from chronic kidney disease.  Currently maintaining his hemoglobin and 9.  Briefly discussed about EPO injections if hemoglobin go below 9.  Side effects such as hypertension, increased risk of blood clot and cardiovascular event if hemoglobin exceeds 11 on injection was discussed.  He has been closely followed by nephrology.  He was advised that on blood work if his hemoglobin goes below 9 to reach out to me.  I will follow-up with him in 6 months.  #AKI on CKD - likely secondary to NSAIDs, and rhabdomyolysis  -Kappa 96.2, lambda 58, ratio 1.65.  SPEP/IFE no M protein.  Follows with Dr. Wynelle Link.  Orders Placed This Encounter  Procedures   CBC with Differential/Platelet   CMP (Cancer Center only)   Iron and TIBC([facility])   Ferritin   RTC in 6 months for MD visit, lab.  Patient expressed understanding and was in agreement with this plan. He also understands that He can call clinic at any time with any questions, concerns, or complaints.   I spent a total of 30 minutes reviewing chart data, face-to-face evaluation with the patient, counseling and coordination of care as detailed above.  Michaelyn Barter, MD    12/06/2022 10:38 AM

## 2022-12-09 ENCOUNTER — Ambulatory Visit (INDEPENDENT_AMBULATORY_CARE_PROVIDER_SITE_OTHER): Payer: Medicare PPO

## 2022-12-09 DIAGNOSIS — I70223 Atherosclerosis of native arteries of extremities with rest pain, bilateral legs: Secondary | ICD-10-CM

## 2022-12-13 LAB — VAS US ABI WITH/WO TBI
Left ABI: 1.03
Right ABI: 1.1

## 2023-06-05 ENCOUNTER — Inpatient Hospital Stay: Payer: Medicare PPO

## 2023-06-05 ENCOUNTER — Inpatient Hospital Stay: Payer: Medicare PPO | Admitting: Internal Medicine

## 2023-07-04 ENCOUNTER — Encounter (INDEPENDENT_AMBULATORY_CARE_PROVIDER_SITE_OTHER): Payer: Self-pay

## 2024-01-09 ENCOUNTER — Encounter: Payer: Self-pay | Admitting: Podiatry

## 2024-01-09 ENCOUNTER — Ambulatory Visit: Admitting: Podiatry

## 2024-01-09 VITALS — Ht 69.0 in | Wt 161.0 lb

## 2024-01-09 DIAGNOSIS — B351 Tinea unguium: Secondary | ICD-10-CM

## 2024-01-09 DIAGNOSIS — M79675 Pain in left toe(s): Secondary | ICD-10-CM

## 2024-01-09 DIAGNOSIS — M79674 Pain in right toe(s): Secondary | ICD-10-CM

## 2024-01-21 NOTE — Progress Notes (Signed)
   Chief Complaint  Patient presents with   Nail Problem    Pt is here due to right great toenail, toenail is lifted up and is causing some discomfort.    SUBJECTIVE Patient presents to office today complaining of elongated, thickened nails that cause pain while ambulating in shoes.  Patient is unable to trim their own nails.  He is also concerned due to a loosely adhered toenail plate to the right hallux.  Patient is here for further evaluation and treatment.  Past Medical History:  Diagnosis Date   Arthritis    Chest pain     No Known Allergies   OBJECTIVE General Patient is awake, alert, and oriented x 3 and in no acute distress. Derm Skin is dry and supple bilateral. Negative open lesions or macerations. Remaining integument unremarkable. Nails are tender, long, thickened and dystrophic with subungual debris, consistent with onychomycosis, 1-5 bilateral. No signs of infection noted. Vasc  DP and PT pedal pulses palpable bilaterally. Temperature gradient within normal limits.  Neuro Epicritic and protective threshold sensation grossly intact bilaterally.  Musculoskeletal Exam No symptomatic pedal deformities noted bilateral. Muscular strength within normal limits.  ASSESSMENT 1.  Pain due to onychomycosis of toenails both 2.  Loosely adhered nail plate to the right great toenail  PLAN OF CARE 1. Patient evaluated today.  2. Instructed to maintain good pedal hygiene and foot care.  3. Mechanical debridement of nails 1-5 bilaterally performed using a nail nipper. Filed with dremel without incident.  4. Return to clinic PRN   Thresa EMERSON Sar, DPM [facility]  Dr. Thresa EMERSON Sar, DPM    [address] [place], [place] [address]                Office [phone]  Fax [phone]
